# Patient Record
Sex: Male | Born: 1960
Health system: Southern US, Community
[De-identification: ages and names within clinical notes are randomized; demographics above are authoritative.]

## PROBLEM LIST (undated history)

## (undated) DIAGNOSIS — I10 Essential (primary) hypertension: Secondary | ICD-10-CM

## (undated) DIAGNOSIS — H3321 Serous retinal detachment, right eye: Secondary | ICD-10-CM

## (undated) DIAGNOSIS — L237 Allergic contact dermatitis due to plants, except food: Secondary | ICD-10-CM

## (undated) DIAGNOSIS — T7840XA Allergy, unspecified, initial encounter: Secondary | ICD-10-CM

## (undated) DIAGNOSIS — F419 Anxiety disorder, unspecified: Secondary | ICD-10-CM

## (undated) DIAGNOSIS — K635 Polyp of colon: Secondary | ICD-10-CM

## (undated) DIAGNOSIS — E7800 Pure hypercholesterolemia, unspecified: Secondary | ICD-10-CM

## (undated) HISTORY — DX: Allergy, unspecified, initial encounter: T78.40XA

## (undated) HISTORY — DX: Pure hypercholesterolemia, unspecified: E78.00

## (undated) HISTORY — DX: Serous retinal detachment, right eye: H33.21

## (undated) HISTORY — DX: Polyp of colon: K63.5

## (undated) HISTORY — PX: RETINAL DETACHMENT SURGERY: SHX105

## (undated) HISTORY — DX: Allergic contact dermatitis due to plants, except food: L23.7

## (undated) HISTORY — DX: Essential (primary) hypertension: I10

## (undated) HISTORY — PX: EYE SURGERY: SHX253

---

## 1990-03-22 DIAGNOSIS — H3321 Serous retinal detachment, right eye: Secondary | ICD-10-CM

## 1990-03-22 HISTORY — DX: Serous retinal detachment, right eye: H33.21

## 2007-04-23 HISTORY — PX: COLONOSCOPY: SHX174

## 2007-05-12 ENCOUNTER — Encounter (INDEPENDENT_AMBULATORY_CARE_PROVIDER_SITE_OTHER): Payer: Self-pay | Admitting: Gastroenterology

## 2007-05-12 ENCOUNTER — Ambulatory Visit (HOSPITAL_COMMUNITY): Admission: RE | Admit: 2007-05-12 | Discharge: 2007-05-12 | Payer: Self-pay | Admitting: Gastroenterology

## 2009-02-03 ENCOUNTER — Telehealth (INDEPENDENT_AMBULATORY_CARE_PROVIDER_SITE_OTHER): Payer: Self-pay | Admitting: *Deleted

## 2009-02-04 ENCOUNTER — Ambulatory Visit: Payer: Self-pay

## 2009-02-04 ENCOUNTER — Encounter (HOSPITAL_COMMUNITY): Admission: RE | Admit: 2009-02-04 | Discharge: 2009-03-19 | Payer: Self-pay | Admitting: Family Medicine

## 2009-02-04 ENCOUNTER — Ambulatory Visit: Payer: Self-pay | Admitting: Cardiology

## 2010-03-14 ENCOUNTER — Ambulatory Visit
Admission: RE | Admit: 2010-03-14 | Discharge: 2010-03-14 | Payer: Self-pay | Source: Home / Self Care | Admitting: Emergency Medicine

## 2010-03-14 DIAGNOSIS — J029 Acute pharyngitis, unspecified: Secondary | ICD-10-CM | POA: Insufficient documentation

## 2010-03-14 DIAGNOSIS — I10 Essential (primary) hypertension: Secondary | ICD-10-CM | POA: Insufficient documentation

## 2010-03-14 DIAGNOSIS — E782 Mixed hyperlipidemia: Secondary | ICD-10-CM | POA: Insufficient documentation

## 2010-03-14 DIAGNOSIS — E785 Hyperlipidemia, unspecified: Secondary | ICD-10-CM

## 2010-03-14 LAB — CONVERTED CEMR LAB: Rapid Strep: NEGATIVE

## 2010-04-23 NOTE — Assessment & Plan Note (Signed)
Summary: SORE THROAT/TM   Vital Signs:  Patient Profile:   50 Years Old Male CC:      Cold & URI symptoms x 3 days Height:     75 inches Weight:      192 pounds O2 Sat:      100 % O2 treatment:    Room Air Temp:     97.4 degrees F oral Pulse rate:   69 / minute Resp:     18 per minute BP sitting:   113 / 75  (left arm) Cuff size:   regular  Vitals Entered By: Lajean Saver RN (March 14, 2010 12:09 PM)                  Updated Prior Medication List: LISINOPRIL-HYDROCHLOROTHIAZIDE 20-25 MG TABS (LISINOPRIL-HYDROCHLOROTHIAZIDE) once daily * PREVASTATIN 40MG  once daily FISH OIL 1000 MG CAPS (OMEGA-3 FATTY ACIDS)  MULTIVITAMINS  TABS (MULTIPLE VITAMIN)   Current Allergies: No known allergies History of Present Illness History from: patient Chief Complaint: Cold & URI symptoms x 3 days History of Present Illness: 50 Years Old Male complains of onset of cold symptoms for 3 days.  Stephen [name] has been using OTC mucines which is helping a little bit. ++ sore throat (main symptom) No cough No pleuritic pain No wheezing + nasal congestion No post-nasal drainage + sinus pain/pressure No chest congestion No itchy/red eyes No earache No hemoptysis No SOB No chills/sweats No fever No nausea No vomiting No abdominal pain No diarrhea No skin rashes No fatigue No myalgias No headache   REVIEW OF SYSTEMS Constitutional Symptoms      Denies fever, chills, night sweats, weight loss, weight gain, and fatigue.  Eyes       Denies change in vision, eye pain, eye discharge, glasses, contact lenses, and eye surgery. Ear/Nose/Throat/Mouth       Complains of frequent runny nose, sinus problems, sore throat, and hoarseness.      Denies hearing loss/aids, change in hearing, ear pain, ear discharge, dizziness, frequent nose bleeds, and tooth pain or bleeding.  Respiratory       Denies dry cough, productive cough, wheezing, shortness of breath, asthma, bronchitis, and emphysema/COPD.   Cardiovascular       Denies murmurs, chest pain, and tires easily with exhertion.    Gastrointestinal       Denies stomach pain, nausea/vomiting, diarrhea, constipation, blood in bowel movements, and indigestion. Genitourniary       Denies painful urination, kidney stones, and loss of urinary control. Neurological       Complains of headaches.      Denies paralysis, seizures, and fainting/blackouts. Musculoskeletal       Denies muscle pain, joint pain, joint stiffness, decreased range of motion, redness, swelling, muscle weakness, and gout.  Skin       Denies bruising, unusual mles/lumps or sores, and hair/skin or nail changes.  Psych       Denies mood changes, temper/anger issues, anxiety/stress, speech problems, depression, and sleep problems.  Past History:  Past Medical History: Hyperlipidemia Hypertension  Past Surgical History: detached retina  Family History: parkinsons- mother Family History of Colon CA 1st degree relative <60- father  Social History: Never Smoked Alcohol use-yes Drug use-no Smoking Status:  never Drug Use:  no Physical Exam General appearance: well developed, well nourished, no acute distress Ears: normal, no lesions or deformities, cerumen right Nasal: clear discharge Oral/Pharynx: clear PND, no erythema Neck: neck supple,  trachea midline, no masses Chest/Lungs: no rales, wheezes,  or rhonchi bilateral, breath sounds equal without effort Heart: regular rate and  rhythm, no murmur MSE: oriented to time, place, and person Assessment New Problems: UPPER RESPIRATORY INFECTION, ACUTE (ICD-465.9) SORE THROAT (ICD-462) FAMILY HISTORY OF COLON CA 1ST DEGREE RELATIVE <60 (ICD-V16.0) HYPERTENSION (ICD-401.9) HYPERLIPIDEMIA (ICD-272.4)   Patient Education: Patient and/or caregiver instructed in the following: rest, fluids, Tylenol prn.  Plan New Medications/Changes: AMOXICILLIN 875 MG TABS (AMOXICILLIN) 1 by mouth two times a day for 7 days   #14 x 0, 03/14/2010, Hoyt Koch MD PREDNISONE 10 MG TABS (PREDNISONE) 20mg  two times a day for 4 days, no taper  #QS x 0, 03/14/2010, Hoyt Koch MD  New Orders: New Patient Level III [16109] Rapid Strep [60454] Planning Comments:   1)  Take the prescribed antibiotic as instructed.  Hold for a few days since this is likely viral and will be gone in a few days anyway. 2)  Use nasal saline solution (over the counter) at least 3 times a day. 3)  Use over the counter decongestants like Zyrtec-D every 12 hours as needed to help with congestion. 4)  Can take tylenol every 6 hours or motrin every 8 hours for pain or fever. 5)  Follow up with your primary doctor  if no improvement in 5-7 days, sooner if increasing pain, fever, or new symptoms.     The patient and/or caregiver has been counseled thoroughly with regard to medications prescribed including dosage, schedule, interactions, rationale for use, and possible side effects and they verbalize understanding.  Diagnoses and expected course of recovery discussed and will return if not improved as expected or if the condition worsens. Patient and/or caregiver verbalized understanding.  Prescriptions: AMOXICILLIN 875 MG TABS (AMOXICILLIN) 1 by mouth two times a day for 7 days  #14 x 0   Entered and Authorized by:   Hoyt Koch MD   Signed by:   Hoyt Koch MD on 03/14/2010   Method used:   Print then Give to Patient   RxID:   [id number] PREDNISONE 10 MG TABS (PREDNISONE) 20mg  two times a day for 4 days, no taper  #QS x 0   Entered and Authorized by:   Hoyt Koch MD   Signed by:   Hoyt Koch MD on 03/14/2010   Method used:   Print then Give to Patient   RxID:   [id number]   Orders Added: 1)  New Patient Level III [41324] 2)  Rapid Strep [40102]    Laboratory Results  Date/Time Received: March 14, 2010 12:24 PM  Date/Time Reported: March 14, 2010 12:24 PM   Other Tests  Rapid  Strep: negative  Kit Test Internal QC: Negative   (Normal Range: Negative)

## 2010-08-04 NOTE — Op Note (Signed)
NAMEGUIDO, COMP               ACCOUNT NO.:  [id number]   MEDICAL RECORD NO.:  [id number]          PATIENT TYPE:  AMB   LOCATION:  ENDO                         FACILITY:  [facility]   PHYSICIAN:  Petra Kuba, M.D.    DATE OF BIRTH:  12-13-1960   DATE OF PROCEDURE:  05/12/2007  DATE OF DISCHARGE:                               OPERATIVE REPORT   PROCEDURE:  Colonoscopy.   INDICATIONS:  Family history of colon cancer in the dad at age 50.   CONSENT:  Consent was signed after risks, benefits, methods, options  thoroughly discussed in the office.   MEDICINES USED:  Fentanyl 100 mcg, Versed 10 mg.   DESCRIPTION OF PROCEDURE:  Rectal inspection is pertinent for external  hemorrhoids, small.  Digital exam was negative.   The pediatric video colonoscope was inserted and easily advanced around  the colon to the cecum.  This did require abdominal pressure, but no  position changes.  No obvious abnormality was seen on insertion.  The  cecum was identified by the appendiceal orifice and the ileocecal valve.  In fact, the scope was inserted a short way into the terminal ileum,  which was normal.  Photo documentation was obtained.  The scope was  slowly withdrawn.   The prep was adequate.  There was some liquid stool that required  washing and suctioning on slow withdrawal through the colon.  The cecum,  ascending, transverse and descending were normal.  In the distal  sigmoid, a few hyperplastic-appearing polyps were seen and were cold  biopsied times one and put in one container.  In the very distal  sigmoid, a small polyp was seen, snared, electrocautery applied and the  polyp was suctioned through the scope and collected in the trap and put  in a second container.  The scope was withdrawn back to the rectum.  Anorectal pull-through and retroflexion confirmed some small  hemorrhoids.  The scope was straightened, readvanced a short ways up the  left side of the colon, air was suctioned,  scope removed.  The patient  tolerated the procedure well.  There was no obvious immediate  complication.   ENDOSCOPIC DIAGNOSES:  1. Internal and external small hemorrhoids.  2. Distal sigmoid small polyp, snared.  3. Few hyperplastic-appearing distal sigmoid polyps, cold biopsied,      put in a separate container.  4. Otherwise within normal limits to the terminal ileum.   PLAN:  Await pathology, but probably recheck colon screening in five  years.  Happy to see back p.r.n.  Otherwise  return care to Dr. Cliffton Asters.           ______________________________  Petra Kuba, M.D.     MEM/MEDQ  D:  05/12/2007  T:  05/12/2007  Job:  [id number]   cc:   [place]. Cliffton Asters, M.D.

## 2010-09-14 ENCOUNTER — Emergency Department (HOSPITAL_BASED_OUTPATIENT_CLINIC_OR_DEPARTMENT_OTHER)
Admission: EM | Admit: 2010-09-14 | Discharge: 2010-09-14 | Disposition: A | Discharge status: Home / Self Care | Payer: 59 | Attending: Emergency Medicine | Admitting: Emergency Medicine

## 2010-09-14 ENCOUNTER — Emergency Department (INDEPENDENT_AMBULATORY_CARE_PROVIDER_SITE_OTHER): Payer: 59

## 2010-09-14 DIAGNOSIS — I10 Essential (primary) hypertension: Secondary | ICD-10-CM | POA: Insufficient documentation

## 2010-09-14 DIAGNOSIS — R55 Syncope and collapse: Secondary | ICD-10-CM | POA: Insufficient documentation

## 2010-09-14 DIAGNOSIS — J329 Chronic sinusitis, unspecified: Secondary | ICD-10-CM

## 2010-09-14 DIAGNOSIS — E7800 Pure hypercholesterolemia, unspecified: Secondary | ICD-10-CM | POA: Insufficient documentation

## 2010-09-14 DIAGNOSIS — S060X1A Concussion with loss of consciousness of 30 minutes or less, initial encounter: Secondary | ICD-10-CM | POA: Insufficient documentation

## 2010-09-14 DIAGNOSIS — M5030 Other cervical disc degeneration, unspecified cervical region: Secondary | ICD-10-CM

## 2010-09-14 DIAGNOSIS — W19XXXA Unspecified fall, initial encounter: Secondary | ICD-10-CM

## 2010-09-14 DIAGNOSIS — M47812 Spondylosis without myelopathy or radiculopathy, cervical region: Secondary | ICD-10-CM

## 2010-09-14 DIAGNOSIS — Y92009 Unspecified place in unspecified non-institutional (private) residence as the place of occurrence of the external cause: Secondary | ICD-10-CM | POA: Insufficient documentation

## 2010-09-14 DIAGNOSIS — S02109A Fracture of base of skull, unspecified side, initial encounter for closed fracture: Secondary | ICD-10-CM

## 2010-09-14 LAB — DIFFERENTIAL
Lymphocytes Relative: 14 % (ref 12–46)
Lymphs Abs: 1.2 10*3/uL (ref 0.7–4.0)
Monocytes Relative: 7 % (ref 3–12)
Neutro Abs: 7.2 10*3/uL (ref 1.7–7.7)
Neutrophils Relative %: 79 % — ABNORMAL HIGH (ref 43–77)

## 2010-09-14 LAB — CBC
HCT: 41.9 % (ref 39.0–52.0)
Hemoglobin: 14.9 g/dL (ref 13.0–17.0)
MCH: 30.5 pg (ref 26.0–34.0)
MCV: 85.9 fL (ref 78.0–100.0)
Platelets: 177 10*3/uL (ref 150–400)
RBC: 4.88 MIL/uL (ref 4.22–5.81)
WBC: 9.2 10*3/uL (ref 4.0–10.5)

## 2010-09-14 LAB — BASIC METABOLIC PANEL
CO2: 25 mEq/L (ref 19–32)
Calcium: 10.1 mg/dL (ref 8.4–10.5)
Chloride: 93 mEq/L — ABNORMAL LOW (ref 96–112)
Creatinine, Ser: 1.2 mg/dL (ref 0.50–1.35)
Glucose, Bld: 87 mg/dL (ref 70–99)

## 2010-09-14 LAB — TROPONIN I: Troponin I: <0.3 ng/mL (ref <0.30)

## 2010-09-14 LAB — CK TOTAL AND CKMB (NOT AT ARMC): Total CK: 146 U/L (ref 7–232)

## 2010-09-14 LAB — GLUCOSE, CAPILLARY: Glucose-Capillary: 89 mg/dL (ref 70–99)

## 2010-09-15 ENCOUNTER — Encounter: Payer: Self-pay | Admitting: Internal Medicine

## 2010-09-15 ENCOUNTER — Ambulatory Visit (INDEPENDENT_AMBULATORY_CARE_PROVIDER_SITE_OTHER): Payer: 59 | Admitting: Internal Medicine

## 2010-09-15 VITALS — BP 120/72 | HR 78 | Temp 97.3°F | Wt 187.0 lb

## 2010-09-15 DIAGNOSIS — R55 Syncope and collapse: Secondary | ICD-10-CM

## 2010-09-15 NOTE — Progress Notes (Signed)
Subjective:    Patient ID: Stephen [name], male    DOB: 04-08-1960, 50 y.o.   MRN: [id number]  HPI Mr. Kendzierski presents to establish for on-going care. On the 25th he had a vaso-vagal espisode after working out in the heat at which time he struck the left forehead as he fell and had a very brief loss of consciousness. He was seen in the ED, records reviewed, images reviewed, where he was found to have a partial cortical fracture of the left frontal bone with normal brain scan and normal CT c-spine. He had routine labs, CBC, Cmet, CE that were all normal. He was diagnosed with possible concussion. Since yesterday he has been feeling better. No major headache, no change in vision, no cognitive problems or speech problems, no coordination problems.   Past Medical History  Diagnosis Date  . High cholesterol   . Colon polyps   . Hypertension   . Detached retina, right 1992    Dr. Jenkins Rouge at [facility] did orbital band procedure with good results.    No past surgical history on file. Family History  Problem Relation Age of Onset  . Parkinsonism Mother   . Cancer Father     colon cancer with mets  . Hyperlipidemia Father   . Hypertension Father   . Hypertension Brother   . Diabetes Neg Hx   . Heart disease Neg Hx    History   Social History  . Marital Status: Married    Spouse Name: N/A    Number of Children: 2  . Years of Education: 16   Occupational History  . busines [employer]   Social History Main Topics  . Smoking status: Never Smoker   . Smokeless tobacco: Never Used  . Alcohol Use: 0.5 oz/week    1 drink(s) per week  . Drug Use: No  . Sexually Active: Yes -- Male partner(s)   Other Topics Concern  . Not on file   Social History Narrative   HSG, [employer] - BS business admin. Married '84 - 1 son '86, 1 dtr -'77. Work - Engineer, drilling, part-time.Marriage is in good health.        Review of Systems Review of Systems  Constitutional:  Negative for fever, chills, activity  change and unexpected weight change.  HEENT:  Negative for hearing loss, ear pain, congestion, neck stiffness and postnasal drip. Negative for sore throat or swallowing problems. Negative for dental complaints.   Eyes: Negative for vision loss or change in visual acuity.  Respiratory: Negative for chest tightness and wheezing.   Cardiovascular: Negative for chest pain and palpitation. No decreased exercise tolerance Gastrointestinal: No change in bowel habit. No bloating or gas. No reflux or indigestion Genitourinary: Negative for urgency, frequency, flank pain and difficulty urinating.  Musculoskeletal: Negative for myalgias, back pain, arthralgias and gait problem.  Neurological: Negative for dizziness, tremors, weakness and headaches.  Hematological: Negative for adenopathy.  Psychiatric/Behavioral: Negative for behavioral problems and dysphoric mood.       Objective:   Physical Exam Vitals noted. Gen'l -a tall, slender, healthy appearing white male in no distress HEENT - Small bump above the left eyebrow. C&S clear. No Battle's sign, no Racoon's eye. Nekc supple Chest - no deformity Lungs - CTAP Cor-RRR Neuro - A&O x 3, speech clear, cognition is normal, able to say "no ifs ands or buts." CN II-XII - normal facial symmetry and motion, PERRLA, EOMI, fundi with sharp disk margins, no tongue fasiculations or deviation. Normal shoulder  shrug. MS normal throughout. Cerebellar function - nl gait and station, no tremor       Assessment & Plan:  Vaso-vagal episode - by history, data review and exam patient seems to have experience a vaso-vagal episode. Reviewed CT images - he has sustained a small fracture of the cortical frontal bone. Brain appears normal. Exam is normal. He may have a mild concussion but has no symptoms or findings.  Plan - no restrictions on activity           Advised as to how to avoid future problems (hydration!!) and that should he ever feel feint to go to ground  voluntarily.  He will return as needed.

## 2010-10-07 ENCOUNTER — Telehealth: Payer: Self-pay | Admitting: *Deleted

## 2010-10-07 NOTE — Telephone Encounter (Signed)
Inquiring as to if previous MD medical records have been received?

## 2010-10-07 NOTE — Telephone Encounter (Signed)
One note from Hoyt Koch, MD from '11

## 2010-10-08 NOTE — Telephone Encounter (Signed)
Pt informed. I advised pt to contact the other MD's office to obtain his records if he wants Dr. Debby Bud to have them. He states Ami Bullins, CMA for Dr. Debby Bud was going to try to obtain his records for him. I advised him Ami is out of office for the rest of this week but I will try to look for his records and call him if I find them.

## 2010-11-06 ENCOUNTER — Telehealth: Payer: Self-pay | Admitting: *Deleted

## 2010-11-06 NOTE — Telephone Encounter (Signed)
Pt call req to know if MD received copy of records from [place] MD, Dr Laurann Montana [phone]). Per MD - these were not received as of yet. I left pt VM that we would attempt again to get these. (Already have made attempt previously but never received records) Need to call [facility] & inquire on Monday.

## 2010-11-16 NOTE — Telephone Encounter (Signed)
Spoke w/ Darryl Lent. Need to fax release form to [phone]. Spoke w/Pt's wife. She will get new release form signed and back to the office soon.

## 2010-11-17 NOTE — Telephone Encounter (Signed)
Record release faxed to [phone] form sent to scanning

## 2010-11-24 ENCOUNTER — Telehealth: Payer: Self-pay | Admitting: Internal Medicine

## 2010-11-24 NOTE — Telephone Encounter (Signed)
Forwarded to Dr. Norins for review. °

## 2011-03-04 ENCOUNTER — Other Ambulatory Visit: Payer: Self-pay | Admitting: *Deleted

## 2011-03-04 MED ORDER — PRAVASTATIN SODIUM 40 MG PO TABS: 40.0000 mg | ORAL_TABLET | Freq: Every day | ORAL | Status: DC

## 2011-04-22 ENCOUNTER — Telehealth: Payer: Self-pay | Admitting: *Deleted

## 2011-04-22 ENCOUNTER — Encounter: Payer: Self-pay | Admitting: Internal Medicine

## 2011-04-22 ENCOUNTER — Other Ambulatory Visit (INDEPENDENT_AMBULATORY_CARE_PROVIDER_SITE_OTHER): Payer: 59

## 2011-04-22 ENCOUNTER — Encounter: Payer: Self-pay | Admitting: *Deleted

## 2011-04-22 ENCOUNTER — Ambulatory Visit (INDEPENDENT_AMBULATORY_CARE_PROVIDER_SITE_OTHER): Payer: 59 | Admitting: Internal Medicine

## 2011-04-22 DIAGNOSIS — I10 Essential (primary) hypertension: Secondary | ICD-10-CM

## 2011-04-22 DIAGNOSIS — E785 Hyperlipidemia, unspecified: Secondary | ICD-10-CM

## 2011-04-22 DIAGNOSIS — Z0000 Encounter for general adult medical examination without abnormal findings: Secondary | ICD-10-CM

## 2011-04-22 LAB — HEPATIC FUNCTION PANEL
Alkaline Phosphatase: 55 U/L (ref 39–117)
Bilirubin, Direct: 0.1 mg/dL (ref 0.0–0.3)
Total Bilirubin: 0.9 mg/dL (ref 0.3–1.2)

## 2011-04-22 LAB — COMPREHENSIVE METABOLIC PANEL
AST: 29 U/L (ref 0–37)
Albumin: 4.9 g/dL (ref 3.5–5.2)
Alkaline Phosphatase: 55 U/L (ref 39–117)
BUN: 22 mg/dL (ref 6–23)
Calcium: 9.5 mg/dL (ref 8.4–10.5)
Chloride: 100 mEq/L (ref 96–112)
Creatinine, Ser: 1 mg/dL (ref 0.4–1.5)
Glucose, Bld: 101 mg/dL — ABNORMAL HIGH (ref 70–99)
Potassium: 3.8 mEq/L (ref 3.5–5.1)

## 2011-04-22 LAB — LIPID PANEL
Cholesterol: 160 mg/dL (ref 0–200)
LDL Cholesterol: 90 mg/dL (ref 0–99)
Triglycerides: 39 mg/dL (ref 0.0–149.0)
VLDL: 7.8 mg/dL (ref 0.0–40.0)

## 2011-04-22 MED ORDER — LISINOPRIL 20 MG PO TABS: 20.0000 mg | ORAL_TABLET | Freq: Every day | ORAL | Status: DC

## 2011-04-22 NOTE — Telephone Encounter (Signed)
Same day abstraction. 

## 2011-04-22 NOTE — Progress Notes (Signed)
Subjective:    Patient ID: Stephen [name], male    DOB: [birth year], 51 y.o.   MRN: [id number]  HPI Stephen [name] presents for an annual medical exam. He has had no major illness, surgery, or injury.   Past Medical History  Diagnosis Date  . High cholesterol   . Colon polyps   . Hypertension   . Detached retina, right 1992    Dr. Jenkins [name] at [facility] did orbital band procedure with good results.    Past Surgical History  Procedure Date  . Retinal detachment surgery    Family History  Problem Relation Age of Onset  . Parkinsonism Mother   . Cancer Father     colon cancer with mets  . Hyperlipidemia Father   . Hypertension Father   . Hypertension Brother   . Diabetes Neg Hx   . Heart disease Neg Hx    History   Social History  . Marital Status: Married    Spouse Name: N/A    Number of Children: 2  . Years of Education: 16   Occupational History  . busines [employer]   Social History Main Topics  . Smoking status: Never Smoker   . Smokeless tobacco: Never Used  . Alcohol Use: 0.5 oz/week    1 drink(s) per week  . Drug Use: No  . Sexually Active: Yes -- Male partner(s)   Other Topics Concern  . Not on file   Social History Narrative   HSG, [employer] - BS business admin. Married '84 - 1 son '86, 1 dtr -'14. Work - Engineer, drilling, part-time.Marriage is in good health.        Review of Systems Constitutional:  Negative for fever, chills, activity change and unexpected weight change.  HEENT:  Negative for hearing loss, ear pain, congestion, neck stiffness and postnasal drip. Negative for sore throat or swallowing problems. Negative for dental complaints.   Eyes: Negative for vision loss or change in visual acuity.  Respiratory: Negative for chest tightness and wheezing. Negative for DOE.   Cardiovascular: Negative for chest pain or palpitations. No decreased exercise tolerance Gastrointestinal: No change in bowel habit. No bloating or gas. No reflux or  indigestion Genitourinary: Negative for urgency, frequency, flank pain and difficulty urinating.  Musculoskeletal: Negative for myalgias, back pain, arthralgias and gait problem.  Neurological: Negative for dizziness, tremors, weakness and headaches.  Hematological: Negative for adenopathy.  Psychiatric/Behavioral: Negative for behavioral problems and dysphoric mood.        Objective:   Physical Exam Filed Vitals:   04/22/11 0911  BP: 118/78  Pulse: 88  Temp: 97.6 F (36.4 C)  Resp: 16   Gen'l: Well nourished well developed white male in no acute distress  HEENT: Head: Normocephalic and atraumatic. Right Ear: External ear normal. EAC/TM nl. Left Ear: External ear normal.  EAC/TM nl. Nose: Nose normal. Mouth/Throat: Oropharynx is clear and moist. Dentition - native, in good repair. No buccal or palatal lesions. Posterior pharynx clear. Eyes: Conjunctivae and sclera clear. EOM intact. Pupils are equal, round, and reactive to light. Right eye exhibits no discharge. Left eye exhibits no discharge. Neck: Normal range of motion. Neck supple. No JVD present. No tracheal deviation present. No thyromegaly present.  Cardiovascular: Normal rate, regular rhythm, no gallop, no friction rub, no murmur heard.      Quiet precordium. 2+ radial and DP pulses . No carotid bruits Pulmonary/Chest: Effort normal. No respiratory distress or increased WOB, no wheezes, no rales. No chest wall deformity or  CVAT. Abdominal: Soft. Bowel sounds are normal in all quadrants. He exhibits no distension, no tenderness, no rebound or guarding, No heptosplenomegaly  Genitourinary:  rectal exam with NST, prostate smooth, normal size w/o nodules Musculoskeletal: Normal range of motion. He exhibits no edema and no tenderness.       Small and large joints without redness, synovial thickening or deformity. Full range of motion preserved about all small, median and large joints.  Lymphadenopathy:    He has no cervical or  supraclavicular adenopathy.  Neurological: He is alert and oriented to person, place, and time. CN II-XII intact. DTRs 2+ and symmetrical biceps, radial and patellar tendons. Cerebellar function normal with no tremor, rigidity, normal gait and station.  Skin: Skin is warm and dry. No rash noted. No erythema. Multiple freckles and moles. Sebhorrheic keratosis right back 8 cm below scapula Psychiatric: He has a normal mood and affect. His behavior is normal. Thought content normal.   Lab Results  Component Value Date   WBC 9.2 09/14/2010   HGB 14.9 09/14/2010   HCT 41.9 09/14/2010   PLT 177 09/14/2010   GLUCOSE 101* 04/22/2011   CHOL 160 04/22/2011   TRIG 39.0 04/22/2011   HDL 61.80 04/22/2011   LDLCALC 90 04/22/2011        ALT 43 04/22/2011        AST 29 04/22/2011   NA 139 04/22/2011   K 3.8 04/22/2011   CL 100 04/22/2011   CREATININE 1.0 04/22/2011   BUN 22 04/22/2011   CO2 32 04/22/2011          Assessment & Plan:

## 2011-04-25 DIAGNOSIS — Z0000 Encounter for general adult medical examination without abnormal findings: Secondary | ICD-10-CM | POA: Insufficient documentation

## 2011-04-25 NOTE — Assessment & Plan Note (Signed)
BP Readings from Last 3 Encounters:  04/22/11 118/78  09/15/10 120/72  03/14/10 113/75   Excellent control. No change in regimen

## 2011-04-25 NOTE — Assessment & Plan Note (Signed)
Interval medical history is unremarkable. Physical exam is normal. Lab results are in normal range - excellent. He is current with colorectal cancer screening. Immunizations - current.  In summary - a healthy appearing man. His chronic medical conditions are well controlled. He is encouraged to incorporate aerobic exercise into his routine: 3 times a week for 30 min. He will return for follow-up in 1 year or sooner as needed.

## 2011-04-25 NOTE — Assessment & Plan Note (Signed)
Lab reveals excellent results: HDL at goal of 39+; LDL at goal of 100 or less. Liver functions are normal.  Plan- continue present regimen

## 2011-07-09 ENCOUNTER — Encounter: Payer: Self-pay | Admitting: Endocrinology

## 2011-07-09 ENCOUNTER — Ambulatory Visit (INDEPENDENT_AMBULATORY_CARE_PROVIDER_SITE_OTHER): Payer: 59 | Admitting: Endocrinology

## 2011-07-09 VITALS — BP 130/86 | HR 62 | Temp 98.1°F | Ht 74.0 in | Wt 191.0 lb

## 2011-07-09 DIAGNOSIS — M79602 Pain in left arm: Secondary | ICD-10-CM

## 2011-07-09 DIAGNOSIS — M79609 Pain in unspecified limb: Secondary | ICD-10-CM

## 2011-07-09 NOTE — Patient Instructions (Signed)
Here are some samples of "vimovo" 500/20.  Take 1 pill, 2x a day. I hope you feel better soon.  If you don't feel better by next week, please call back.   We would be happy to refer you to an orthopedic specialist.

## 2011-07-09 NOTE — Progress Notes (Signed)
  Subjective:    Patient ID: Stephen [name], male    DOB: 14-Nov-1960, 51 y.o.   MRN: [id number]  HPI Pt states 1 month of moderate pain at the left forearm (just distal to the elbow, radial aspect), but no assoc numbness.  No injury, but he says he had blood drawn there. Past Medical History  Diagnosis Date  . High cholesterol   . Colon polyps   . Hypertension   . Detached retina, right 1992    Dr. Jenkins Rouge at [facility] did orbital band procedure with good results.     Past Surgical History  Procedure Date  . Retinal detachment surgery     History   Social History  . Marital Status: Married    Spouse Name: N/A    Number of Children: 2  . Years of Education: 16   Occupational History  . busines [employer]   Social History Main Topics  . Smoking status: Never Smoker   . Smokeless tobacco: Never Used  . Alcohol Use: 0.5 oz/week    1 drink(s) per week  . Drug Use: No  . Sexually Active: Yes -- Male partner(s)   Other Topics Concern  . Not on file   Social History Narrative   HSG, [employer] - BS business admin. Married '84 - 1 son '86, 1 dtr -'27. Work - Engineer, drilling, part-time.Marriage is in good health.     Current Outpatient Prescriptions on File Prior to Visit  Medication Sig Dispense Refill  . fish oil-omega-3 fatty acids 1000 MG capsule Take 2 g by mouth daily.        Marland Kitchen lisinopril (PRINIVIL,ZESTRIL) 20 MG tablet Take 1 tablet (20 mg total) by mouth daily.  30 tablet  3  . Multiple Vitamin (MULTIVITAMIN) tablet Take 1 tablet by mouth daily.        . pravastatin (PRAVACHOL) 40 MG tablet Take 1 tablet (40 mg total) by mouth daily.  90 tablet  1    No Known Allergies  Family History  Problem Relation Age of Onset  . Parkinsonism Mother   . Cancer Father     colon cancer with mets  . Hyperlipidemia Father   . Hypertension Father   . Hypertension Brother   . Diabetes Neg Hx   . Heart disease Neg Hx     BP 130/86  Pulse 62  Temp(Src) 98.1 F (36.7 C) (Oral)  Ht 6'  2" (1.88 m)  Wt 191 lb (86.637 kg)  BMI 24.52 kg/m2  SpO2 97%  Review of Systems Denies rash.    Objective:   Physical Exam VITAL SIGNS:  See vs page GENERAL: no distress LUE: slight tenderness at the area described above.  Motor is 5/5, except as limited by pain Neuro: sensation is intact to touch on the LUE. Left radial pulse is normal    Assessment & Plan:  Arm pain, new

## 2011-08-24 ENCOUNTER — Other Ambulatory Visit: Payer: Self-pay | Admitting: Internal Medicine

## 2011-09-02 ENCOUNTER — Other Ambulatory Visit: Payer: Self-pay | Admitting: Internal Medicine

## 2011-12-01 ENCOUNTER — Other Ambulatory Visit: Payer: Self-pay | Admitting: Internal Medicine

## 2012-04-14 ENCOUNTER — Encounter: Payer: Self-pay | Admitting: Internal Medicine

## 2012-04-14 ENCOUNTER — Ambulatory Visit (INDEPENDENT_AMBULATORY_CARE_PROVIDER_SITE_OTHER): Payer: 59 | Admitting: Internal Medicine

## 2012-04-14 VITALS — BP 118/60 | HR 74 | Temp 97.0°F | Resp 10 | Wt 190.1 lb

## 2012-04-14 DIAGNOSIS — J329 Chronic sinusitis, unspecified: Secondary | ICD-10-CM

## 2012-04-14 DIAGNOSIS — A499 Bacterial infection, unspecified: Secondary | ICD-10-CM

## 2012-04-14 DIAGNOSIS — B9689 Other specified bacterial agents as the cause of diseases classified elsewhere: Secondary | ICD-10-CM | POA: Insufficient documentation

## 2012-04-14 MED ORDER — CEFUROXIME AXETIL 500 MG PO TABS: 500.0000 mg | ORAL_TABLET | Freq: Two times a day (BID) | ORAL | Status: DC

## 2012-04-14 NOTE — Progress Notes (Signed)
  Subjective:    Patient ID: Stephen [name], male    DOB: Nov 21, 1960, 52 y.o.   MRN: [id number]  Sinusitis This is a new problem. The current episode started 1 to 4 weeks ago. The problem has been gradually worsening since onset. There has been no fever. His pain is at a severity of 0/10. The pain is mild. Associated symptoms include congestion, sinus pressure and a sore throat. Pertinent negatives include no chills, coughing, diaphoresis, ear pain, headaches, hoarse voice, neck pain, shortness of breath, sneezing or swollen glands. Past treatments include oral decongestants, spray decongestants and saline sprays. The treatment provided moderate relief.      Review of Systems  Constitutional: Negative for fever, chills, diaphoresis, activity change, appetite change, fatigue and unexpected weight change.  HENT: Positive for congestion, sore throat and sinus pressure. Negative for ear pain, nosebleeds, hoarse voice, facial swelling, rhinorrhea, sneezing, drooling, mouth sores, trouble swallowing, neck pain, dental problem, voice change and postnasal drip.   Eyes: Negative.   Respiratory: Negative for cough, chest tightness and shortness of breath.   Cardiovascular: Negative.   Gastrointestinal: Negative for nausea, vomiting, abdominal pain, diarrhea and constipation.  Genitourinary: Negative.   Musculoskeletal: Negative.   Skin: Negative for color change, pallor, rash and wound.  Neurological: Negative for dizziness, weakness and headaches.  Hematological: Negative for adenopathy. Does not bruise/bleed easily.  Psychiatric/Behavioral: Negative.        Objective:   Physical Exam  Vitals reviewed. Constitutional: He is oriented to person, place, and time. He appears well-developed and well-nourished.  Non-toxic appearance. He does not have a sickly appearance. He does not appear ill. No distress.  HENT:  Head: Normocephalic and atraumatic. No trismus in the jaw.  Right Ear: Hearing, tympanic  membrane, external ear and ear canal normal.  Left Ear: Hearing, tympanic membrane, external ear and ear canal normal.  Nose: Mucosal edema and rhinorrhea present. No nose lacerations, sinus tenderness, nasal deformity, septal deviation or nasal septal hematoma. No epistaxis.  No foreign bodies. Right sinus exhibits maxillary sinus tenderness. Right sinus exhibits no frontal sinus tenderness. Left sinus exhibits maxillary sinus tenderness. Left sinus exhibits no frontal sinus tenderness.  Mouth/Throat: Mucous membranes are normal. Mucous membranes are not pale, not dry and not cyanotic. No oral lesions. No uvula swelling. Posterior oropharyngeal erythema present. No oropharyngeal exudate, posterior oropharyngeal edema or tonsillar abscesses.  Eyes: Conjunctivae normal are normal. Right eye exhibits no discharge. Left eye exhibits no discharge. No scleral icterus.  Neck: Normal range of motion. Neck supple. No JVD present. No tracheal deviation present.  Cardiovascular: Normal rate, regular rhythm, normal heart sounds and intact distal pulses.  Exam reveals no gallop and no friction rub.   No murmur heard. Pulmonary/Chest: Effort normal and breath sounds normal. No stridor. No respiratory distress. He has no wheezes. He has no rales. He exhibits no tenderness.  Abdominal: Soft. Bowel sounds are normal. He exhibits no distension and no mass. There is no tenderness. There is no rebound and no guarding.  Musculoskeletal: Normal range of motion. He exhibits no edema and no tenderness.  Lymphadenopathy:    He has no cervical adenopathy.  Neurological: He is oriented to person, place, and time.  Skin: Skin is warm and dry. No rash noted. He is not diaphoretic. No erythema. No pallor.  Psychiatric: He has a normal mood and affect. His behavior is normal. Judgment and thought content normal.          Assessment & Plan:

## 2012-04-14 NOTE — Patient Instructions (Signed)

## 2012-04-14 NOTE — Assessment & Plan Note (Signed)
Start ceftin for the infection 

## 2012-04-17 ENCOUNTER — Other Ambulatory Visit: Payer: Self-pay | Admitting: Internal Medicine

## 2012-05-30 ENCOUNTER — Other Ambulatory Visit (INDEPENDENT_AMBULATORY_CARE_PROVIDER_SITE_OTHER): Payer: 59

## 2012-05-30 ENCOUNTER — Encounter: Payer: Self-pay | Admitting: Internal Medicine

## 2012-05-30 ENCOUNTER — Ambulatory Visit (INDEPENDENT_AMBULATORY_CARE_PROVIDER_SITE_OTHER): Payer: 59 | Admitting: Internal Medicine

## 2012-05-30 VITALS — BP 130/90 | HR 64 | Temp 97.9°F | Ht 75.0 in | Wt 183.0 lb

## 2012-05-30 DIAGNOSIS — Z0000 Encounter for general adult medical examination without abnormal findings: Secondary | ICD-10-CM

## 2012-05-30 DIAGNOSIS — E785 Hyperlipidemia, unspecified: Secondary | ICD-10-CM

## 2012-05-30 DIAGNOSIS — I10 Essential (primary) hypertension: Secondary | ICD-10-CM

## 2012-05-30 LAB — COMPREHENSIVE METABOLIC PANEL
ALT: 32 U/L (ref 0–53)
AST: 25 U/L (ref 0–37)
Alkaline Phosphatase: 53 U/L (ref 39–117)
Creatinine, Ser: 1 mg/dL (ref 0.4–1.5)
GFR: 82.53 mL/min (ref >60.00)
Sodium: 140 mEq/L (ref 135–145)
Total Bilirubin: 1 mg/dL (ref 0.3–1.2)
Total Protein: 7.1 g/dL (ref 6.0–8.3)

## 2012-05-30 LAB — HEPATIC FUNCTION PANEL
ALT: 32 U/L (ref 0–53)
AST: 25 U/L (ref 0–37)
Albumin: 4.4 g/dL (ref 3.5–5.2)
Alkaline Phosphatase: 53 U/L (ref 39–117)
Total Protein: 7.1 g/dL (ref 6.0–8.3)

## 2012-05-30 LAB — LIPID PANEL
HDL: 52.3 mg/dL (ref >39.00)
LDL Cholesterol: 80 mg/dL (ref 0–99)
Total CHOL/HDL Ratio: 3
Triglycerides: 73 mg/dL (ref 0.0–149.0)
VLDL: 14.6 mg/dL (ref 0.0–40.0)

## 2012-05-30 NOTE — Progress Notes (Signed)
Subjective:    Patient ID: Stephen [name], male    DOB: 1960-10-13, 52 y.o.   MRN: [id number]  HPI Stephen [name] presents for a routine wellness exam and that you have been doing fine with no major illness, except for a sinus infection in January, no surgery and no injury. No adverse reactions to medications. Life is good.   He is current with his dentist, with his eye doctor for annual exam. He does exercise, has a reasonably healthy diet.  Past Medical History  Diagnosis Date  . High cholesterol   . Colon polyps   . Hypertension   . Detached retina, right 1992    Dr. Jenkins Rouge at [facility] did orbital band procedure with good results.    Past Surgical History  Procedure Laterality Date  . Retinal detachment surgery     Family History  Problem Relation Age of Onset  . Parkinsonism Mother   . Cancer Father     colon cancer with mets  . Hyperlipidemia Father   . Hypertension Father   . Hypertension Brother   . Diabetes Neg Hx   . Heart disease Neg Hx    History   Social History  . Marital Status: Married    Spouse Name: N/A    Number of Children: 2  . Years of Education: 16   Occupational History  . [place]   Social History Main Topics  . Smoking status: Never Smoker   . Smokeless tobacco: Never Used  . Alcohol Use: 0.5 oz/week    1 drink(s) per week  . Drug Use: No  . Sexually Active: Yes -- Male partner(s)   Other Topics Concern  . Not on file   Social History Narrative   HSG, [employer] - BS business admin. Married '84 - 1 son '86, 1 dtr -'75. Work - Engineer, drilling, part-time.   Marriage is in good health.       Review of Systems Constitutional:  Negative for fever, chills, activity change and unexpected weight change.  HEENT:  Negative for hearing loss, ear pain, congestion, neck stiffness and postnasal drip. Negative for sore throat or swallowing problems. Negative for dental complaints.   Eyes: Negative for vision loss or change in visual acuity.   Respiratory: Negative for chest tightness and wheezing. Negative for DOE.   Cardiovascular: Negative for chest pain or palpitations. No decreased exercise tolerance Gastrointestinal: No change in bowel habit. No bloating or gas. No reflux or indigestion Genitourinary: Negative for urgency, frequency, flank pain and difficulty urinating.  Musculoskeletal: Negative for myalgias, back pain, arthralgias and gait problem.  Neurological: Negative for dizziness, tremors, weakness and headaches.  Hematological: Negative for adenopathy.  Psychiatric/Behavioral: Negative for behavioral problems and dysphoric mood.       Objective:   Physical Exam Filed Vitals:   05/30/12 0903  BP: 130/90  Pulse: 64  Temp: 97.9 F (36.6 C)   Wt Readings from Last 3 Encounters:  05/30/12 183 lb (83.008 kg)  04/14/12 190 lb 1.9 oz (86.238 kg)  07/09/11 191 lb (86.637 kg)   Gen'l: Well nourished well developed white male in no acute distress  HEENT: Head: Normocephalic and atraumatic. Right Ear: External ear normal. EAC/TM nl. Left Ear: External ear normal.  EAC/TM nl. Nose: Nose normal. Mouth/Throat: Oropharynx is clear and moist. Dentition - native, in good repair. No buccal or palatal lesions. Posterior pharynx clear. Eyes: Conjunctivae and sclera clear. EOM intact. Pupils are equal, round, and reactive to light. Right eye exhibits  no discharge. Left eye exhibits no discharge. Neck: Normal range of motion. Neck supple. No JVD present. No tracheal deviation present. No thyromegaly present.  Cardiovascular: Normal rate, regular rhythm, no gallop, no friction rub, no murmur heard.      Quiet precordium. 2+ radial and 1+ DP pulses . No carotid bruits Pulmonary/Chest: Effort normal. No respiratory distress or increased WOB, no wheezes, no rales. No chest wall deformity or CVAT. Abdomen: Soft. Bowel sounds are normal in all quadrants. He exhibits no distension, no tenderness, no rebound or guarding, No  heptosplenomegaly  Genitourinary:  deferred Musculoskeletal: Normal range of motion. He exhibits no edema and no tenderness.       Small and large joints without redness, synovial thickening or deformity. Full range of motion preserved about all small, median and large joints.  Lymphadenopathy:    He has no cervical or supraclavicular adenopathy.  Neurological: He is alert and oriented to person, place, and time. CN II-XII intact. DTRs 2+ and symmetrical biceps, radial and patellar tendons. Cerebellar function normal with no tremor, rigidity, normal gait and station.  Skin: Skin is warm and dry. No rash noted. No erythema.  Psychiatric: He has a normal mood and affect. His behavior is normal. Thought content normal.   Lab Results  Component Value Date   WBC 9.2 09/14/2010   HGB 14.9 09/14/2010   HCT 41.9 09/14/2010   PLT 177 09/14/2010   GLUCOSE 94 05/30/2012   CHOL 147 05/30/2012   TRIG 73.0 05/30/2012   HDL 52.30 05/30/2012   LDLCALC 80 05/30/2012        ALT 32 05/30/2012   AST 25 05/30/2012        NA 140 05/30/2012   K 4.3 05/30/2012   CL 102 05/30/2012   CREATININE 1.0 05/30/2012   BUN 17 05/30/2012   CO2 30 05/30/2012        Assessment & Plan:

## 2012-05-30 NOTE — Patient Instructions (Addendum)
Thanks for coming in and also for helping teach a budding doctor. Do not reveal our secrets!!!!  Your exam is norma and you seem to be doing well.   Routine labs today and results will be in the letter I send you as well as on MyChart.  See you in a year or sooner as needed.

## 2012-05-31 NOTE — Assessment & Plan Note (Signed)
Interval history is negative for any major illness, injury or surgery. Physical exam is normal. Lab results are in normal range. He is current for colorectal cancer screening and is due for follow up this year. Discussed pros and cons of prostate cancer screening (USPHCTF recommendations reviewed and ACU April '13 recommendations) and he defers evaluation at this time. Immunizations - up to date.  In summary - a very nice man who appears to be healthy. He will continue his good health habits and return for routine follow up in 1 year.

## 2012-05-31 NOTE — Assessment & Plan Note (Signed)
LDL is very good - much better than goal of 130 or less. Liver functions are normal  Plan Continue present regimen

## 2012-05-31 NOTE — Assessment & Plan Note (Signed)
BP Readings from Last 3 Encounters:  05/30/12 130/90  04/14/12 118/60  07/09/11 130/86   Good control. Renal function and electrolytes are normal  Plan Continue present regimen

## 2012-08-16 ENCOUNTER — Other Ambulatory Visit: Payer: Self-pay | Admitting: Internal Medicine

## 2012-09-07 ENCOUNTER — Other Ambulatory Visit: Payer: Self-pay | Admitting: Internal Medicine

## 2013-01-25 ENCOUNTER — Other Ambulatory Visit: Payer: Self-pay

## 2013-02-13 ENCOUNTER — Other Ambulatory Visit: Payer: Self-pay | Admitting: Internal Medicine

## 2013-02-13 ENCOUNTER — Encounter: Payer: Self-pay | Admitting: Internal Medicine

## 2013-03-28 ENCOUNTER — Ambulatory Visit (INDEPENDENT_AMBULATORY_CARE_PROVIDER_SITE_OTHER): Payer: 59 | Admitting: Internal Medicine

## 2013-03-28 ENCOUNTER — Encounter: Payer: Self-pay | Admitting: Internal Medicine

## 2013-03-28 VITALS — BP 130/72 | HR 84 | Temp 99.5°F | Resp 16

## 2013-03-28 DIAGNOSIS — B9789 Other viral agents as the cause of diseases classified elsewhere: Principal | ICD-10-CM

## 2013-03-28 DIAGNOSIS — J069 Acute upper respiratory infection, unspecified: Secondary | ICD-10-CM

## 2013-03-28 MED ORDER — OSELTAMIVIR PHOSPHATE 75 MG PO CAPS: 75.0000 mg | ORAL_CAPSULE | Freq: Two times a day (BID) | ORAL | Status: DC

## 2013-03-28 MED ORDER — PROMETHAZINE-CODEINE 6.25-10 MG/5ML PO SYRP
5.0000 mL | ORAL_SOLUTION | ORAL | Status: DC | PRN
Start: 2013-03-28 — End: 2013-09-26

## 2013-03-28 NOTE — Progress Notes (Signed)
Patient ID: Stephen [name], male   DOB: Oct 10, 1960, 53 y.o.   MRN: [id number]   Subjective:   HPI  complains of flu-like symptoms  Onset <48h ago, progressively worse symptoms  associated with fever, headache, myalgia and exhaustion Also mild nasal congestion, sneezing, sore throat, and cough Minimal relief with OTC meds Precipitated by sick contacts  Past Medical History  Diagnosis Date  . High cholesterol   . Colon polyps   . Hypertension   . Detached retina, right 1992    Dr. [name] at [facility] did [facility] band procedure with good results.     Review of Systems Constitutional: No unexpected weight change Pulmonary: No pleurisy or hemoptysis Cardiovascular: No chest pain or palpitations     Objective:   Physical Exam BP 130/72  Pulse 84  Temp(Src) 99.5 F (37.5 C) (Oral)  Resp 16 GEN: mildly ill appearing, fatigued HENT: NCAT, no sinus tenderness bilaterally, nares with clear discharge, oropharynx mild erythema, no exudate Eyes: Vision grossly intact, no conjunctivitis Neck: shoddy anterior LAD, supple with FROM Lungs: Clear to auscultation without rhonchi or wheeze, no increased work of breathing Cardiovascular: Regular rate and rhythm, no bilateral edema  Lab Results  Component Value Date   WBC 9.2 09/14/2010   HGB 14.9 09/14/2010   HCT 41.9 09/14/2010   PLT 177 09/14/2010   GLUCOSE 94 05/30/2012   CHOL 147 05/30/2012   TRIG 73.0 05/30/2012   HDL 52.30 05/30/2012   LDLCALC 80 05/30/2012   ALT 32 05/30/2012   ALT 32 05/30/2012   AST 25 05/30/2012   AST 25 05/30/2012   NA 140 05/30/2012   K 4.3 05/30/2012   CL 102 05/30/2012   CREATININE 1.0 05/30/2012   BUN 17 05/30/2012   CO2 30 05/30/2012      Assessment & Plan:   Viral URI - symptoms clinically consistent with influenza    Explained lack of efficacy for traditional antibiotics in viral disease  Tamiflu prescribed + prescription cough suppression - new prescriptions done  Symptomatic care with Tylenol and/or  Advil, hydration and rest - also OTC decongestants, antihistamines, and/or antitussives and salt gargle advised as needed  Pt education provided

## 2013-03-28 NOTE — Patient Instructions (Addendum)
It was good to see you today.  Tamiflu antibiotics and prescription cough syrup - Your prescription(s) have been submitted to your pharmacy (syrup prescription given to you to take to pharmacy). Please take as directed and contact our office if you believe you are having problem(s) with the medication(s).  Alternate between ibuprofen and tylenol for aches, pain and fever symptoms as discussed  Hydrate, rest and call if worse or unimproved Upper Respiratory Infection, Adult An upper respiratory infection (URI) is also sometimes known as the common cold. The upper respiratory tract includes the nose, sinuses, throat, trachea, and bronchi. Bronchi are the airways leading to the lungs. Most people improve within 1 week, but symptoms can last up to 2 weeks. A residual cough may last even longer.  CAUSES Many different viruses can infect the tissues lining the upper respiratory tract. The tissues become irritated and inflamed and often become very moist. Mucus production is also common. A cold is contagious. You can easily spread the virus to others by oral contact. This includes kissing, sharing a glass, coughing, or sneezing. Touching your mouth or nose and then touching a surface, which is then touched by another person, can also spread the virus. SYMPTOMS  Symptoms typically develop 1 to 3 days after you come in contact with a cold virus. Symptoms vary from person to person. They may include:  Runny nose.  Sneezing.  Nasal congestion.  Sinus irritation.  Sore throat.  Loss of voice (laryngitis).  Cough.  Fatigue.  Muscle aches.  Loss of appetite.  Headache.  Low-grade fever. DIAGNOSIS  You might diagnose your own cold based on familiar symptoms, since most people get a cold 2 to 3 times a year. Your caregiver can confirm this based on your exam. Most importantly, your caregiver can check that your symptoms are not due to another disease such as strep throat, sinusitis, pneumonia,  asthma, or epiglottitis. Blood tests, throat tests, and X-rays are not necessary to diagnose a common cold, but they may sometimes be helpful in excluding other more serious diseases. Your caregiver will decide if any further tests are required. RISKS AND COMPLICATIONS  You may be at risk for a more severe case of the common cold if you smoke cigarettes, have chronic heart disease (such as heart failure) or lung disease (such as asthma), or if you have a weakened immune system. The very young and very old are also at risk for more serious infections. Bacterial sinusitis, middle ear infections, and bacterial pneumonia can complicate the common cold. The common cold can worsen asthma and chronic obstructive pulmonary disease (COPD). Sometimes, these complications can require emergency medical care and may be life-threatening. PREVENTION  The best way to protect against getting a cold is to practice good hygiene. Avoid oral or hand contact with people with cold symptoms. Wash your hands often if contact occurs. There is no clear evidence that vitamin C, vitamin E, echinacea, or exercise reduces the chance of developing a cold. However, it is always recommended to get plenty of rest and practice good nutrition. TREATMENT  Treatment is directed at relieving symptoms. There is no cure. Antibiotics are not effective, because the infection is caused by a virus, not by bacteria. Treatment may include:  Increased fluid intake. Sports drinks offer valuable electrolytes, sugars, and fluids.  Breathing heated mist or steam (vaporizer or shower).  Eating chicken soup or other clear broths, and maintaining good nutrition.  Getting plenty of rest.  Using gargles or lozenges for comfort.  Controlling fevers with ibuprofen or acetaminophen as directed by your caregiver.  Increasing usage of your inhaler if you have asthma. Zinc gel and zinc lozenges, taken in the first 24 hours of the common cold, can shorten the  duration and lessen the severity of symptoms. Pain medicines may help with fever, muscle aches, and throat pain. A variety of non-prescription medicines are available to treat congestion and runny nose. Your caregiver can make recommendations and may suggest nasal or lung inhalers for other symptoms.  HOME CARE INSTRUCTIONS   Only take over-the-counter or prescription medicines for pain, discomfort, or fever as directed by your caregiver.  Use a warm mist humidifier or inhale steam from a shower to increase air moisture. This may keep secretions moist and make it easier to breathe.  Drink enough water and fluids to keep your urine clear or pale yellow.  Rest as needed.  Return to work when your temperature has returned to normal or as your caregiver advises. You may need to stay home longer to avoid infecting others. You can also use a face mask and careful hand washing to prevent spread of the virus. SEEK MEDICAL CARE IF:   After the first few days, you feel you are getting worse rather than better.  You need your caregiver's advice about medicines to control symptoms.  You develop chills, worsening shortness of breath, or brown or red sputum. These may be signs of pneumonia.  You develop yellow or brown nasal discharge or pain in the face, especially when you bend forward. These may be signs of sinusitis.  You develop a fever, swollen neck glands, pain with swallowing, or white areas in the back of your throat. These may be signs of strep throat. SEEK IMMEDIATE MEDICAL CARE IF:   You have a fever.  You develop severe or persistent headache, ear pain, sinus pain, or chest pain.  You develop wheezing, a prolonged cough, cough up blood, or have a change in your usual mucus (if you have chronic lung disease).  You develop sore muscles or a stiff neck. Document Released: 09/01/2000 Document Revised: 05/31/2011 Document Reviewed: 07/10/2010 [facility] Patient Information 2014 [place],  [place].

## 2013-03-28 NOTE — Progress Notes (Signed)
Pre visit review using our clinic review tool, if applicable. No additional management support is needed unless otherwise documented below in the visit note. 

## 2013-06-11 ENCOUNTER — Encounter: Payer: Self-pay | Admitting: *Deleted

## 2013-06-11 ENCOUNTER — Encounter: Payer: Self-pay | Admitting: Family Medicine

## 2013-06-11 ENCOUNTER — Ambulatory Visit (INDEPENDENT_AMBULATORY_CARE_PROVIDER_SITE_OTHER): Payer: 59 | Admitting: Family Medicine

## 2013-06-11 VITALS — BP 152/90 | HR 74

## 2013-06-11 DIAGNOSIS — M545 Low back pain, unspecified: Secondary | ICD-10-CM

## 2013-06-11 DIAGNOSIS — IMO0002 Reserved for concepts with insufficient information to code with codable children: Secondary | ICD-10-CM

## 2013-06-11 DIAGNOSIS — S39012A Strain of muscle, fascia and tendon of lower back, initial encounter: Secondary | ICD-10-CM | POA: Insufficient documentation

## 2013-06-11 MED ORDER — MELOXICAM 15 MG PO TABS: 15.0000 mg | ORAL_TABLET | Freq: Every day | ORAL | Status: DC

## 2013-06-11 MED ORDER — KETOROLAC TROMETHAMINE 60 MG/2ML IM SOLN
60.0000 mg | Freq: Once | INTRAMUSCULAR | Status: AC
Start: 2013-06-11 — End: 2013-06-11
  Administered 2013-06-11: 60 mg via INTRAMUSCULAR

## 2013-06-11 MED ORDER — CYCLOBENZAPRINE HCL 10 MG PO TABS: 10.0000 mg | ORAL_TABLET | Freq: Three times a day (TID) | ORAL | Status: DC | PRN

## 2013-06-11 MED ORDER — METHYLPREDNISOLONE ACETATE 80 MG/ML IJ SUSP
80.0000 mg | Freq: Once | INTRAMUSCULAR | Status: AC
  Administered 2013-06-11: 80 mg via INTRAMUSCULAR

## 2013-06-11 NOTE — Progress Notes (Signed)
  [name] [address], [place] [address] Phone: [phone] Subjective:    I'm seeing this patient by the request  OX:[id number] Norins, MD     CC: Acute low back pain  MEQ:[id number] Stephen [name] is a 53 y.o. male coming in with complaint of acute low back pain. Patient states he started having left-sided low back pain when he started shoveling a couple weeks ago. Patient states since that time this pain has gotten worse and worse. Patient states that it seems to be localized on the left side with no radiation down the legs any numbness or tingling and denies any weakness of the lower extremity. Patient also denies any bowel or bladder incontinence. Patient has tried over-the-counter anti-inflammatories with no significant improvement. Patient found it difficult to even concentrator work secondary to pain. Patient states worse when going from seated to standing position and states it is very difficult going from a lying to a sitting position. States the pain seems to be worse with flexion. Only feels good with no movement.     Past medical history, social, surgical and family history all reviewed in electronic medical record.   Review of Systems: No headache, visual changes, nausea, vomiting, diarrhea, constipation, dizziness, abdominal pain, skin rash, fevers, chills, night sweats, weight loss, swollen lymph nodes, body aches, joint swelling, muscle aches, chest pain, shortness of breath, mood changes.   Objective Blood pressure 152/90, pulse 74, SpO2 98.00%.  General: No apparent distress alert and oriented x3 mood and affect normal, dressed appropriately.  HEENT: Pupils equal, extraocular movements intact  Respiratory: Patient's speak in full sentences and does not appear short of breath  Cardiovascular: No lower extremity edema, non tender, no erythema  Skin: Warm dry intact with no signs of infection or rash on extremities or on axial skeleton.    Abdomen: Soft nontender  Neuro: Cranial nerves II through XII are intact, neurovascularly intact in all extremities with 2+ DTRs and 2+ pulses.  Lymph: No lymphadenopathy of posterior or anterior cervical chain or axillae bilaterally.  Gait normal with good balance and coordination.  MSK:  Non tender with full range of motion and good stability and symmetric strength and tone of shoulders, elbows, wrist, hip, knee and ankles bilaterally.  Back Exam:  Inspection: Unremarkable  Motion: Flexion 25 deg, Extension 25 deg, Side Bending to 25 deg bilaterally,  Rotation to 25 deg bilaterally  SLR laying: Negative  XSLR laying: Negative  Palpable tenderness: Patient is tender to palpation over the paraspinal musculature and iliolumbar ligament on the left side FABER: negative. Sensory change: Gross sensation intact to all lumbar and sacral dermatomes.  Reflexes: 2+ at both patellar tendons, 2+ at achilles tendons, Babinski's downgoing.  Strength at foot  Plantar-flexion: 5/5 Dorsi-flexion: 5/5 Eversion: 5/5 Inversion: 5/5  Leg strength  Quad: 5/5 Hamstring: 5/5 Hip flexor: 5/5 Hip abductors: 5/5  Gait unremarkable cautious     Impression and Recommendations:     This case required medical decision making of moderate complexity.

## 2013-06-11 NOTE — Patient Instructions (Signed)
Good to meet you Ice 20 minutes 2 times a day Avoid heat for now Meloxicam daily for 10 days then as needed Flexeril as needed but may make you sleepy Exercises start in 48 hours Come back in 1 week if not perfect

## 2013-06-11 NOTE — Assessment & Plan Note (Signed)
Patient given home exercise program. Patient given an injection of Depo-Medrol as well as Toradol today. Medications per orders. We discussed icing and set of heat and home exercises to start in 48 hours. Patient will come back again in one week. If he continues to have pain we'll consider imaging and may consider manipulation if completely improve.

## 2013-06-17 ENCOUNTER — Encounter: Payer: Self-pay | Admitting: Family Medicine

## 2013-08-17 ENCOUNTER — Other Ambulatory Visit: Payer: Self-pay

## 2013-08-17 MED ORDER — LISINOPRIL 20 MG PO TABS: 20.0000 mg | ORAL_TABLET | Freq: Every day | ORAL | Status: DC

## 2013-09-06 ENCOUNTER — Telehealth: Payer: Self-pay | Admitting: *Deleted

## 2013-09-06 MED ORDER — PRAVASTATIN SODIUM 40 MG PO TABS: ORAL_TABLET | ORAL | Status: DC

## 2013-09-06 NOTE — Telephone Encounter (Signed)
Refill done.  

## 2013-09-26 ENCOUNTER — Encounter: Payer: Self-pay | Admitting: Internal Medicine

## 2013-09-26 ENCOUNTER — Ambulatory Visit (INDEPENDENT_AMBULATORY_CARE_PROVIDER_SITE_OTHER): Payer: 59 | Admitting: Internal Medicine

## 2013-09-26 VITALS — BP 108/72 | HR 71 | Temp 98.2°F | Ht 75.0 in | Wt 184.8 lb

## 2013-09-26 DIAGNOSIS — J028 Acute pharyngitis due to other specified organisms: Secondary | ICD-10-CM

## 2013-09-26 DIAGNOSIS — J029 Acute pharyngitis, unspecified: Secondary | ICD-10-CM | POA: Insufficient documentation

## 2013-09-26 MED ORDER — AZITHROMYCIN 250 MG PO TABS: ORAL_TABLET | ORAL | Status: DC

## 2013-09-26 NOTE — Progress Notes (Signed)
Pre visit review using our clinic review tool, if applicable. No additional management support is needed unless otherwise documented below in the visit note. 

## 2013-09-26 NOTE — Assessment & Plan Note (Signed)
Mild to mod, for antibx course,  to f/u any worsening symptoms or concerns 

## 2013-09-26 NOTE — Progress Notes (Signed)
   Subjective:    Patient ID: Nolyn Swab, male    DOB: [birth year], 53 y.o.   MRN: [id number]  HPI   Here with 2-3 days acute onset fever, severe ST, pressure, headache, general weakness and malaise, and nonprod cough, but pt denies chest pain, wheezing, increased sob or doe, orthopnea, PND, increased LE swelling, palpitations, dizziness or syncope.  Past Medical History  Diagnosis Date  . High cholesterol   . Colon polyps   . Hypertension   . Detached retina, right 1992    Dr. [name] at [facility] did orbital band procedure with good results.    Past Surgical History  Procedure Laterality Date  . Retinal detachment surgery      reports that he has never smoked. He has never used smokeless tobacco. He reports that he drinks about .5 ounces of alcohol per week. He reports that he does not use illicit drugs. family history includes Cancer in his father; Hyperlipidemia in his father; Hypertension in his brother and father; Parkinsonism in his mother. There is no history of Diabetes or Heart disease. No Known Allergies Current Outpatient Prescriptions on File Prior to Visit  Medication Sig Dispense Refill  . fish oil-omega-3 fatty acids 1000 MG capsule Take 2 g by mouth daily.        Marland Kitchen lisinopril (PRINIVIL,ZESTRIL) 20 MG tablet Take 1 tablet (20 mg total) by mouth daily.  30 tablet  5  . Multiple Vitamin (MULTIVITAMIN) tablet Take 1 tablet by mouth daily.        . pravastatin (PRAVACHOL) 40 MG tablet TAKE 1 TABLET BY MOUTH DAILY.  90 tablet  0   No current facility-administered medications on file prior to visit.   Review of Systems All otherwise neg per pt     Objective:   Physical Exam BP 108/72  Pulse 71  Temp(Src) 98.2 F (36.8 C) (Oral)  Ht 6\' 3"  (1.905 m)  Wt 184 lb 12 oz (83.802 kg)  BMI 23.09 kg/m2  SpO2 98% VS noted, mild ill Constitutional: Pt appears well-developed, well-nourished.  HENT: Head: NCAT.  Right Ear: External ear normal.  Left Ear: External ear normal.    Bilat tm's with mild erythema.  Max sinus areas non tender.  Pharynx with severe erythema, + exudate Eyes: . Pupils are equal, round, and reactive to light. Conjunctivae and EOM are normal Neck: Normal range of motion. Neck supple.  Cardiovascular: Normal rate and regular rhythm.   Pulmonary/Chest: Effort normal and breath sounds normal.  Neurological: Pt is alert. Not confused , motor grossly intact Skin: Skin is warm. No rash Psychiatric: Pt behavior is normal. No agitation.     Assessment & Plan:

## 2013-09-26 NOTE — Patient Instructions (Signed)
Please take all new medication as prescribed  Please continue all other medications as before, and refills have been done if requested.  Please have the pharmacy call with any other refills you may need.  Please keep your appointments with your specialists as you may have planned     

## 2013-10-03 ENCOUNTER — Telehealth: Payer: Self-pay | Admitting: Internal Medicine

## 2013-10-03 MED ORDER — AZITHROMYCIN 250 MG PO TABS: ORAL_TABLET | ORAL | Status: DC

## 2013-10-03 MED ORDER — HYDROCODONE-HOMATROPINE 5-1.5 MG/5ML PO SYRP: 5.0000 mL | ORAL_SOLUTION | Freq: Four times a day (QID) | ORAL | Status: DC | PRN

## 2013-10-03 NOTE — Telephone Encounter (Signed)
Patient informed script sent in and cough medication needs to have hardcopy picked up.

## 2013-10-03 NOTE — Telephone Encounter (Signed)
Patient still has cough and sore throat from last ov.  He would like to know if he should refill the zpak or if he should try something else.  Please advise. Thanks!

## 2013-10-03 NOTE — Telephone Encounter (Signed)
Azithromycin #6 , ONE qd Hydromet 1 tsp q 6 hrs prn  OVINB

## 2013-10-19 ENCOUNTER — Encounter: Payer: Self-pay | Admitting: Family Medicine

## 2013-10-19 ENCOUNTER — Ambulatory Visit (INDEPENDENT_AMBULATORY_CARE_PROVIDER_SITE_OTHER): Payer: 59 | Admitting: Family Medicine

## 2013-10-19 VITALS — BP 118/72 | HR 65 | Temp 97.5°F | Ht 75.0 in | Wt 184.5 lb

## 2013-10-19 DIAGNOSIS — J301 Allergic rhinitis due to pollen: Secondary | ICD-10-CM

## 2013-10-19 DIAGNOSIS — J069 Acute upper respiratory infection, unspecified: Secondary | ICD-10-CM

## 2013-10-19 DIAGNOSIS — J020 Streptococcal pharyngitis: Secondary | ICD-10-CM

## 2013-10-19 LAB — POCT RAPID STREP A (OFFICE): Rapid Strep A Screen: NEGATIVE

## 2013-10-19 NOTE — Progress Notes (Signed)
  Garret Reddish, MD Phone: [phone]  Subjective:   Stephen [name] is a 53 y.o. year old very pleasant male patient who presents with the following:  Sore throat/congestion/cough Saw Dr. Jenny Reichmann on 7/8 and diagnosed with strep pharyngitis by history and exam, without culture. Treated with azithromycin and did not really improve. Had second round called in on 7/15 and several days later started feeling better. Symptoms resolved.   Started feeling similar to earlier this month yesterday morning with cough and sore throat and mild congestion. Gradually worsening since onset. Nonproductive or clear sputum. Treatments-hycodan, tylenol, zyrtec with mild relief. Pain 3/10 ache compared to 8/10. Does admit to sneezing and rhinorrhea.   ROS- No fever/chills/nausea/vomiting. Mild fatigue.   Past Medical History- pharyngitis (strep 2 weeks ago). Back pain. HLD, HTN  Medications- reviewed and updated Current Outpatient Prescriptions  Medication Sig Dispense Refill  . fish oil-omega-3 fatty acids 1000 MG capsule Take 2 g by mouth daily.        Marland Kitchen HYDROcodone-homatropine (HYCODAN) 5-1.5 MG/5ML syrup Take 5 mLs by mouth every 6 (six) hours as needed for cough.  120 mL  0  . lisinopril (PRINIVIL,ZESTRIL) 20 MG tablet Take 1 tablet (20 mg total) by mouth daily.  30 tablet  5  . Multiple Vitamin (MULTIVITAMIN) tablet Take 1 tablet by mouth daily.        . pravastatin (PRAVACHOL) 40 MG tablet TAKE 1 TABLET BY MOUTH DAILY.  90 tablet  0   No current facility-administered medications for this visit.    Objective: BP 118/72  Pulse 65  Temp(Src) 97.5 F (36.4 C) (Oral)  Ht 6\' 3"  (1.905 m)  Wt 184 lb 8 oz (83.689 kg)  BMI 23.06 kg/m2  SpO2 99% Gen: NAD, resting comfortably HEENT: bluish tint to turbinates. Pharynx erythematous with cobblestoning noted. Tonsils without enlargement or exudate.  TM normal bilaterally, Mucous membranes are moist. CV: RRR no murmurs rubs or gallops Lungs: CTAB no crackles,  wheeze, rhonchi Ext: no edema Skin: warm, dry, no rash  Assessment/Plan:  Upper Respiratory Infection with sore throat Centor 0/5 and rapid strep negative. Given recent treatment for strep want to rule out strep infection with culture and this was sent. Improvement with 2nd course of azithromycin but may have been viral given time couse.  I suspect patient may have some allergies given nasal appearance and sneezing/rhinorrhea-I advised him to add daily zyrtec.  In addition, discussed symptomatic care for URI and time course expected of illness. Given reasons for return   Orders Placed This Encounter  Procedures  . Throat culture [facility])  . POCT rapid strep A

## 2013-10-19 NOTE — Patient Instructions (Addendum)
  Upper Respiratory Infection with possible allergic rhinitis component.  We will still send this for culture to be sure that it is not strep. Will call you if positive.  The most important thing is to get a lot of rest and stay well hydrated.  I would take a daily zyrtec for next 2 weeks. You can try salt water gargles, aleve to reduce inflammation and pain.  If you develop fevers,  symptoms beyond the time we discussed- 2 weeks, worsening of symptoms after they get better, please schedule a follow up visit or give [place] a call for advice.

## 2013-10-19 NOTE — Progress Notes (Signed)
Pre visit review using our clinic review tool, if applicable. No additional management support is needed unless otherwise documented below in the visit note. 

## 2013-10-21 LAB — CULTURE, GROUP A STREP: ORGANISM ID, BACTERIA: NORMAL

## 2013-12-06 ENCOUNTER — Other Ambulatory Visit: Payer: Self-pay | Admitting: Family Medicine

## 2013-12-06 NOTE — Telephone Encounter (Signed)
Refill done.  

## 2014-01-10 ENCOUNTER — Encounter: Payer: Self-pay | Admitting: Internal Medicine

## 2014-02-01 ENCOUNTER — Encounter: Payer: Self-pay | Admitting: Internal Medicine

## 2014-02-01 ENCOUNTER — Ambulatory Visit (INDEPENDENT_AMBULATORY_CARE_PROVIDER_SITE_OTHER): Payer: 59 | Admitting: Internal Medicine

## 2014-02-01 ENCOUNTER — Other Ambulatory Visit (INDEPENDENT_AMBULATORY_CARE_PROVIDER_SITE_OTHER): Payer: 59

## 2014-02-01 VITALS — BP 138/64 | HR 68 | Temp 97.8°F | Resp 16 | Ht 75.0 in | Wt 192.8 lb

## 2014-02-01 DIAGNOSIS — E785 Hyperlipidemia, unspecified: Secondary | ICD-10-CM

## 2014-02-01 DIAGNOSIS — Z0000 Encounter for general adult medical examination without abnormal findings: Secondary | ICD-10-CM

## 2014-02-01 DIAGNOSIS — Z1211 Encounter for screening for malignant neoplasm of colon: Secondary | ICD-10-CM

## 2014-02-01 DIAGNOSIS — I10 Essential (primary) hypertension: Secondary | ICD-10-CM

## 2014-02-01 LAB — LIPID PANEL
CHOLESTEROL: 160 mg/dL (ref 0–200)
HDL: 50.7 mg/dL (ref >39.00)
LDL CALC: 97 mg/dL (ref 0–99)
NonHDL: 109.3
Total CHOL/HDL Ratio: 3
Triglycerides: 60 mg/dL (ref 0.0–149.0)
VLDL: 12 mg/dL (ref 0.0–40.0)

## 2014-02-01 LAB — BASIC METABOLIC PANEL
BUN: 16 mg/dL (ref 6–23)
CALCIUM: 9.8 mg/dL (ref 8.4–10.5)
CO2: 30 meq/L (ref 19–32)
Chloride: 104 mEq/L (ref 96–112)
Creatinine, Ser: 1 mg/dL (ref 0.4–1.5)
GFR: 81.07 mL/min (ref >60.00)
GLUCOSE: 103 mg/dL — AB (ref 70–99)
Potassium: 4.5 mEq/L (ref 3.5–5.1)
SODIUM: 141 meq/L (ref 135–145)

## 2014-02-01 MED ORDER — LISINOPRIL 20 MG PO TABS: 20.0000 mg | ORAL_TABLET | Freq: Every day | ORAL | Status: DC

## 2014-02-01 MED ORDER — PRAVASTATIN SODIUM 40 MG PO TABS: 40.0000 mg | ORAL_TABLET | Freq: Every day | ORAL | Status: DC

## 2014-02-01 NOTE — Assessment & Plan Note (Signed)
Overdue for repeat colonoscopy given that he had polyps on last exam. Referred him back to GI at [facility]. He has gotten flu shot already. Up-to-date on tetanus.

## 2014-02-01 NOTE — Progress Notes (Signed)
Pre visit review using our clinic review tool, if applicable. No additional management support is needed unless otherwise documented below in the visit note. 

## 2014-02-01 NOTE — Patient Instructions (Signed)
We have sent in your refills for your medications. We will also check blood work on you today. We will call you back with those results or they will be available on my chart.  We will send her back to [facility] for your next colonoscopy.  Come back next year for your annual physical.  If you have any problems or questions before your next visit please feel free to call our office.  Exercise to Stay Healthy Exercise helps you become and stay healthy. EXERCISE IDEAS AND TIPS Choose exercises that:  You enjoy.  Fit into your day. You do not need to exercise really hard to be healthy. You can do exercises at a slow or medium level and stay healthy. You can:  Stretch before and after working out.  Try yoga, Pilates, or tai chi.  Lift weights.  Walk fast, swim, jog, run, climb stairs, bicycle, dance, or rollerskate.  Take aerobic classes. Exercises that burn about 150 calories:  Running 1  miles in 15 minutes.  Playing volleyball for 45 to 60 minutes.  Washing and waxing a car for 45 to 60 minutes.  Playing touch football for 45 minutes.  Walking 1  miles in 35 minutes.  Pushing a stroller 1  miles in 30 minutes.  Playing basketball for 30 minutes.  Raking leaves for 30 minutes.  Bicycling 5 miles in 30 minutes.  Walking 2 miles in 30 minutes.  Dancing for 30 minutes.  Shoveling snow for 15 minutes.  Swimming laps for 20 minutes.  Walking up stairs for 15 minutes.  Bicycling 4 miles in 15 minutes.  Gardening for 30 to 45 minutes.  Jumping rope for 15 minutes.  Washing windows or floors for 45 to 60 minutes. Document Released: 04/10/2010 Document Revised: 05/31/2011 Document Reviewed: 04/10/2010 [facility] Patient Information 2015 [place], [place]. This information is not intended to replace advice given to you by your health care provider. Make sure you discuss any questions you have with your health care provider.

## 2014-02-01 NOTE — Assessment & Plan Note (Signed)
Continue pravastatin. Check lipid panel today.  

## 2014-02-01 NOTE — Assessment & Plan Note (Signed)
Blood pressure well controlled on lisinopril. Check basic metabolic panel.

## 2014-02-01 NOTE — Progress Notes (Signed)
   Subjective:    Patient ID: Stephen [name], male    DOB: [birth year], 53 y.o.   MRN: [id number]  HPI The patient is a 53 year old male who comes in today to establish care. He has past medical history of back strain, high blood pressure, hyperlipidemia. He does very well overall and tries exercise. He tries to eat healthfully. He denies any new problems, any back strain. He is due for repeat colonoscopy as he has history of polyps, he got his last one done at [facility]. He is a nonsmoker.  Review of Systems  Constitutional: Negative for fever, activity change, appetite change, fatigue and unexpected weight change.  HENT: Negative.   Respiratory: Negative for cough, chest tightness, shortness of breath and wheezing.   Cardiovascular: Negative for chest pain, palpitations and leg swelling.  Gastrointestinal: Negative for abdominal pain, diarrhea, constipation, blood in stool and abdominal distention.  Genitourinary: Negative.   Musculoskeletal: Negative.   Skin: Negative.   Neurological: Negative.   Psychiatric/Behavioral: Negative.       Objective:   Physical Exam  Constitutional: He is oriented to person, place, and time. He appears well-developed and well-nourished.  HENT:  Head: Normocephalic and atraumatic.  Eyes: EOM are normal.  Neck: Normal range of motion.  Cardiovascular: Normal rate and regular rhythm.   No murmur heard. Pulmonary/Chest: Effort normal and breath sounds normal. No respiratory distress. He has no wheezes. He has no rales.  Abdominal: Soft. Bowel sounds are normal. He exhibits no distension. There is no tenderness. There is no rebound.  Neurological: He is alert and oriented to person, place, and time. Coordination normal.  Skin: Skin is warm and dry.   Filed Vitals:   02/01/14 1054  BP: 138/64  Pulse: 68  Temp: 97.8 F (36.6 C)  TempSrc: Oral  Resp: 16  Height: 6\' 3"  (1.905 m)  Weight: 192 lb 12.8 oz (87.454 kg)  SpO2: 98%      Assessment & Plan:

## 2014-04-08 ENCOUNTER — Other Ambulatory Visit: Payer: Self-pay | Admitting: Internal Medicine

## 2014-04-08 MED ORDER — AMOXICILLIN-POT CLAVULANATE 875-125 MG PO TABS: 1.0000 | ORAL_TABLET | Freq: Two times a day (BID) | ORAL | Status: DC

## 2014-09-06 ENCOUNTER — Other Ambulatory Visit: Payer: Self-pay | Admitting: Gastroenterology

## 2014-09-06 HISTORY — PX: COLONOSCOPY: SHX174

## 2015-02-04 ENCOUNTER — Other Ambulatory Visit: Payer: Self-pay | Admitting: Internal Medicine

## 2015-03-11 ENCOUNTER — Other Ambulatory Visit: Payer: Self-pay | Admitting: Internal Medicine

## 2015-05-07 MED FILL — LISINOPRIL 20 MG TABLET: 20 | 90 days supply | Qty: 90 | Fill #1

## 2015-06-11 MED FILL — PRAVASTATIN NA 40 MG TAB: 40 | 90 days supply | Qty: 90 | Fill #1

## 2015-07-15 ENCOUNTER — Ambulatory Visit (INDEPENDENT_AMBULATORY_CARE_PROVIDER_SITE_OTHER): Payer: 59 | Admitting: Family Medicine

## 2015-07-15 ENCOUNTER — Encounter: Payer: Self-pay | Admitting: Family Medicine

## 2015-07-15 VITALS — BP 132/72 | HR 66 | Temp 97.9°F | Ht 75.0 in | Wt 204.0 lb

## 2015-07-15 DIAGNOSIS — I10 Essential (primary) hypertension: Secondary | ICD-10-CM

## 2015-07-15 DIAGNOSIS — L237 Allergic contact dermatitis due to plants, except food: Secondary | ICD-10-CM | POA: Diagnosis not present

## 2015-07-15 MED ORDER — METHYLPREDNISOLONE 4 MG PO TBPK: ORAL_TABLET | ORAL | Status: DC

## 2015-07-15 MED ORDER — TRIAMCINOLONE ACETONIDE 0.1 % EX CREA: 1.0000 "application " | TOPICAL_CREAM | Freq: Two times a day (BID) | CUTANEOUS | Status: DC

## 2015-07-15 NOTE — Patient Instructions (Signed)
Use dawn dishwasher to cut oil from off clothes.   Poison [employer] ivy is a inflammation of the skin (contact dermatitis) caused by touching the allergens on the leaves of the ivy plant following previous exposure to the plant. The rash usually appears 48 hours after exposure. The rash is usually bumps (papules) or blisters (vesicles) in a linear pattern. Depending on your own sensitivity, the rash may simply cause redness and itching, or it may also progress to blisters which may break open. These must be well cared for to prevent secondary bacterial (germ) infection, followed by scarring. Keep any open areas dry, clean, dressed, and covered with an antibacterial ointment if needed. The eyes may also get puffy. The puffiness is worst in the morning and gets better as the day progresses. This dermatitis usually heals without scarring, within 2 to 3 weeks without treatment. HOME CARE INSTRUCTIONS  Thoroughly wash with soap and water as soon as you have been exposed to poison ivy. You have about one half hour to remove the plant resin before it will cause the rash. This washing will destroy the oil or antigen on the skin that is causing, or will cause, the rash. Be sure to wash under your fingernails as any plant resin there will continue to spread the rash. Do not rub skin vigorously when washing affected area. Poison ivy cannot spread if no oil from the plant remains on your body. A rash that has progressed to weeping sores will not spread the rash unless you have not washed thoroughly. It is also important to wash any clothes you have been wearing as these may carry active allergens. The rash will return if you wear the unwashed clothing, even several days later. Avoidance of the plant in the future is the best measure. Poison ivy plant can be recognized by the number of leaves. Generally, poison ivy has three leaves with flowering branches on a single stem. Diphenhydramine may be purchased over the  counter and used as needed for itching. Do not drive with this medication if it makes you drowsy.Ask your caregiver about medication for children. SEEK MEDICAL CARE IF:  Open sores develop.  Redness spreads beyond area of rash.  You notice purulent (pus-like) discharge.  You have increased pain.  Other signs of infection develop (such as fever).   This information is not intended to replace advice given to you by your health care provider. Make sure you discuss any questions you have with your health care provider.   Document Released: 03/05/2000 Document Revised: 05/31/2011 Document Reviewed: 08/14/2014 Elsevier Interactive Patient Education [employer].

## 2015-07-15 NOTE — Progress Notes (Signed)
Subjective:    Patient ID: Stephen Dimmer., male    DOB: 1960-09-07, 54 y.o.   MRN: [id number]  Chief Complaint  Patient presents with  . Poison Ivy    HPI Patient is in today for poison ivy on bilateral arms and legs. It has been worsening over the past few days. No other complaints.Denies CP/palp/SOB/HA/congestion/fevers/GI or GU c/o. Taking meds as prescribed    Past Medical History  Diagnosis Date  . High cholesterol   . Colon polyps   . Hypertension   . Detached retina, right 1992    Dr. [name] at [facility] did orbital band procedure with good results.   . Poison ivy dermatitis 07/29/2015    Past Surgical History  Procedure Laterality Date  . Retinal detachment surgery      Family History  Problem Relation Age of Onset  . Parkinsonism Mother   . Cancer Father     colon cancer with mets  . Hyperlipidemia Father   . Hypertension Father   . Hypertension Brother   . Diabetes Neg Hx   . Heart disease Neg Hx     Social History   Social History  . Marital Status: Married    Spouse Name: N/A  . Number of Children: 2  . Years of Education: 16   Occupational History  . busines [employer]   Social History Main Topics  . Smoking status: Never Smoker   . Smokeless tobacco: Never Used  . Alcohol Use: 0.5 oz/week    1 drink(s) per week  . Drug Use: No  . Sexual Activity:    Partners: Female   Other Topics Concern  . Not on file   Social History Narrative   HSG, [employer] - BS business admin. Married '84 - 1 son '86, 1 dtr -'26. 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 ACP discussed and he is referred to [email or web address].    Outpatient Prescriptions Prior to Visit  Medication Sig Dispense Refill  . fish oil-omega-3 fatty acids 1000 MG capsule Take 2 g by mouth daily.      Marland Kitchen lisinopril (PRINIVIL,ZESTRIL) 20 MG tablet TAKE 1 TABLET (20 MG) BY MOUTH DAILY. 90 tablet 3  . Multiple  Vitamin (MULTIVITAMIN) tablet Take 1 tablet by mouth daily.      . pravastatin (PRAVACHOL) 40 MG tablet TAKE 1 TABLET (40 MG) BY MOUTH DAILY. 90 tablet 3  . amoxicillin-clavulanate (AUGMENTIN) 875-125 MG per tablet Take 1 tablet by mouth 2 (two) times daily. 14 tablet 0   No facility-administered medications prior to visit.    No Known Allergies  Review of Systems  Constitutional: Negative for fever and malaise/fatigue.  HENT: Negative for congestion.   Eyes: Negative for blurred vision.  Respiratory: Negative for shortness of breath.   Cardiovascular: Negative for chest pain, palpitations and leg swelling.  Gastrointestinal: Negative for nausea, abdominal pain and blood in stool.  Genitourinary: Negative for dysuria and frequency.  Musculoskeletal: Negative for falls.  Skin: Positive for itching and rash.  Neurological: Negative for dizziness, loss of consciousness and headaches.  Endo/Heme/Allergies: Negative for environmental allergies.  Psychiatric/Behavioral: Negative for depression. The patient is not nervous/anxious.        Objective:    Physical Exam  Constitutional: He is oriented to person, place, and time. He appears well-developed and well-nourished. No distress.  HENT:  Head: Normocephalic and atraumatic.  Eyes: Conjunctivae are normal.  Neck: Neck supple. No thyromegaly present.  Cardiovascular: Normal rate, regular rhythm and normal heart sounds.   No murmur heard. Pulmonary/Chest: Effort normal and breath sounds normal. No respiratory distress. He has no wheezes.  Abdominal: Soft. Bowel sounds are normal. He exhibits no mass. There is no tenderness.  Musculoskeletal: He exhibits no edema.  Lymphadenopathy:    He has no cervical adenopathy.  Neurological: He is alert and oriented to person, place, and time.  Skin: Skin is warm and dry. Rash noted.  Scattterd. Fluid filled blisters throughtout.   Psychiatric: He has a normal mood and affect. His behavior is  normal.    BP 132/72 mmHg  Pulse 66  Temp(Src) 97.9 F (36.6 C) (Oral)  Ht 6\' 3"  (1.905 m)  Wt 204 lb (92.534 kg)  BMI 25.50 kg/m2  SpO2 97% Wt Readings from Last 3 Encounters:  07/15/15 204 lb (92.534 kg)  02/01/14 192 lb 12.8 oz (87.454 kg)  10/19/13 184 lb 8 oz (83.689 kg)     Lab Results  Component Value Date   WBC 9.2 09/14/2010   HGB 14.9 09/14/2010   HCT 41.9 09/14/2010   PLT 177 09/14/2010   GLUCOSE 103* 02/01/2014   CHOL 160 02/01/2014   TRIG 60.0 02/01/2014   HDL 50.70 02/01/2014   LDLCALC 97 02/01/2014   ALT 32 05/30/2012   ALT 32 05/30/2012   AST 25 05/30/2012   AST 25 05/30/2012   NA 141 02/01/2014   K 4.5 02/01/2014   CL 104 02/01/2014   CREATININE 1.0 02/01/2014   BUN 16 02/01/2014   CO2 30 02/01/2014    No results found for: TSH Lab Results  Component Value Date   WBC 9.2 09/14/2010   HGB 14.9 09/14/2010   HCT 41.9 09/14/2010   MCV 85.9 09/14/2010   PLT 177 09/14/2010   Lab Results  Component Value Date   NA 141 02/01/2014   K 4.5 02/01/2014   CO2 30 02/01/2014   GLUCOSE 103* 02/01/2014   BUN 16 02/01/2014   CREATININE 1.0 02/01/2014   BILITOT 1.0 05/30/2012   BILITOT 1.0 05/30/2012   ALKPHOS 53 05/30/2012   ALKPHOS 53 05/30/2012   AST 25 05/30/2012   AST 25 05/30/2012   ALT 32 05/30/2012   ALT 32 05/30/2012   PROT 7.1 05/30/2012   PROT 7.1 05/30/2012   ALBUMIN 4.4 05/30/2012   ALBUMIN 4.4 05/30/2012   CALCIUM 9.8 02/01/2014   GFR 81.07 02/01/2014   Lab Results  Component Value Date   CHOL 160 02/01/2014   Lab Results  Component Value Date   HDL 50.70 02/01/2014   Lab Results  Component Value Date   LDLCALC 97 02/01/2014   Lab Results  Component Value Date   TRIG 60.0 02/01/2014   Lab Results  Component Value Date   CHOLHDL 3 02/01/2014   No results found for: HGBA1C     Assessment & Plan:   Problem List Items Addressed This Visit    Poison ivy dermatitis    Dawn, witch hazel, steroids. Report if  persists      Essential hypertension - Primary    Well controlled, no changes to meds. Encouraged heart healthy diet such as the DASH diet and exercise as tolerated.          I have discontinued Mr. Ricarte amoxicillin-clavulanate. I am also having him start on triamcinolone cream. Additionally, I am having him maintain his fish oil-omega-3 fatty acids, multivitamin, lisinopril, pravastatin, and methylPREDNISolone.  Meds ordered this encounter  Medications  .  DISCONTD: methylPREDNISolone (MEDROL DOSEPAK) 4 MG TBPK tablet    Sig: Take 6 tabs po once, then 5 tabs po x1 days, then 4 tabs po x1 day, then 3 tabs x1 day, then 2 tabs x1 day, and one tab by one day.    Dispense:  21 tablet    Refill:  1  . methylPREDNISolone (MEDROL DOSEPAK) 4 MG TBPK tablet    Sig: Take 6 tabs po once, then 5 tabs po x1 days, then 4 tabs po x1 day, then 3 tabs x1 day, then 2 tabs x1 day, and one tab by one day.    Dispense:  21 tablet    Refill:  1  . triamcinolone cream (KENALOG) 0.1 %    Sig: Apply 1 application topically 2 (two) times daily.    Dispense:  80 g    Refill:  0     Stephen Homans, MD

## 2015-07-15 NOTE — Progress Notes (Signed)
Pre visit review using our clinic review tool, if applicable. No additional management support is needed unless otherwise documented below in the visit note. 

## 2015-07-29 ENCOUNTER — Encounter: Payer: Self-pay | Admitting: Family Medicine

## 2015-07-29 DIAGNOSIS — L237 Allergic contact dermatitis due to plants, except food: Secondary | ICD-10-CM

## 2015-07-29 HISTORY — DX: Allergic contact dermatitis due to plants, except food: L23.7

## 2015-07-29 NOTE — Assessment & Plan Note (Signed)
Well controlled, no changes to meds. Encouraged heart healthy diet such as the DASH diet and exercise as tolerated.  °

## 2015-07-29 NOTE — Assessment & Plan Note (Signed)
Dawn, witch hazel, steroids. Report if persists

## 2015-08-06 MED FILL — LISINOPRIL 20 MG TABLET: 20 | 90 days supply | Qty: 90 | Fill #2

## 2015-09-04 DIAGNOSIS — H524 Presbyopia: Secondary | ICD-10-CM | POA: Diagnosis not present

## 2015-09-04 DIAGNOSIS — H5213 Myopia, bilateral: Secondary | ICD-10-CM | POA: Diagnosis not present

## 2015-09-04 DIAGNOSIS — H52223 Regular astigmatism, bilateral: Secondary | ICD-10-CM | POA: Diagnosis not present

## 2015-09-04 MED FILL — PRAVASTATIN NA 40 MG TAB: 40 | 90 days supply | Qty: 90 | Fill #2

## 2015-11-06 MED FILL — LISINOPRIL 20 MG TABLET: 20 | 90 days supply | Qty: 90 | Fill #3

## 2015-12-09 MED FILL — PRAVASTATIN NA 40 MG TAB: 40 | 90 days supply | Qty: 90 | Fill #3

## 2016-02-02 ENCOUNTER — Other Ambulatory Visit: Payer: Self-pay | Admitting: Internal Medicine

## 2016-02-02 MED FILL — LISINOPRIL 20 MG TABLET: 20 | 90 days supply | Qty: 90 | Fill #0

## 2016-02-25 DIAGNOSIS — L821 Other seborrheic keratosis: Secondary | ICD-10-CM | POA: Diagnosis not present

## 2016-02-25 DIAGNOSIS — L814 Other melanin hyperpigmentation: Secondary | ICD-10-CM | POA: Diagnosis not present

## 2016-02-25 DIAGNOSIS — D1801 Hemangioma of skin and subcutaneous tissue: Secondary | ICD-10-CM | POA: Diagnosis not present

## 2016-02-25 DIAGNOSIS — Z86018 Personal history of other benign neoplasm: Secondary | ICD-10-CM | POA: Diagnosis not present

## 2016-02-25 DIAGNOSIS — D225 Melanocytic nevi of trunk: Secondary | ICD-10-CM | POA: Diagnosis not present

## 2016-02-25 DIAGNOSIS — Z23 Encounter for immunization: Secondary | ICD-10-CM | POA: Diagnosis not present

## 2016-03-11 ENCOUNTER — Other Ambulatory Visit: Payer: Self-pay | Admitting: Internal Medicine

## 2016-03-11 MED FILL — PRAVASTATIN NA 40 MG TAB: 40 | 90 days supply | Qty: 90 | Fill #0

## 2016-05-03 MED FILL — LISINOPRIL 20 MG TABLET: 20 | 90 days supply | Qty: 90 | Fill #1

## 2016-06-14 MED FILL — PRAVASTATIN NA 40 MG TAB: 40 | 90 days supply | Qty: 90 | Fill #1

## 2016-07-29 MED FILL — LISINOPRIL 20 MG TABLET: 20 | 90 days supply | Qty: 90 | Fill #2

## 2016-09-06 DIAGNOSIS — H524 Presbyopia: Secondary | ICD-10-CM | POA: Diagnosis not present

## 2016-09-06 DIAGNOSIS — H52223 Regular astigmatism, bilateral: Secondary | ICD-10-CM | POA: Diagnosis not present

## 2016-09-06 DIAGNOSIS — H5213 Myopia, bilateral: Secondary | ICD-10-CM | POA: Diagnosis not present

## 2016-10-21 MED FILL — LISINOPRIL 20 MG TABLET: 20 | 90 days supply | Qty: 90 | Fill #3

## 2016-10-21 MED FILL — PRAVASTATIN NA 40 MG TAB: 40 | 90 days supply | Qty: 90 | Fill #2

## 2016-12-20 DIAGNOSIS — H33051 Total retinal detachment, right eye: Secondary | ICD-10-CM | POA: Diagnosis not present

## 2016-12-20 DIAGNOSIS — H524 Presbyopia: Secondary | ICD-10-CM | POA: Diagnosis not present

## 2016-12-20 DIAGNOSIS — H2513 Age-related nuclear cataract, bilateral: Secondary | ICD-10-CM | POA: Diagnosis not present

## 2016-12-20 DIAGNOSIS — H43811 Vitreous degeneration, right eye: Secondary | ICD-10-CM | POA: Diagnosis not present

## 2016-12-20 DIAGNOSIS — H00022 Hordeolum internum right lower eyelid: Secondary | ICD-10-CM | POA: Diagnosis not present

## 2016-12-20 MED FILL — NEO/POLY/DEXAMET EYE OINT: 3.5-10000-0 | 7 days supply | Qty: 4 | Fill #0

## 2016-12-20 MED FILL — DOXYCYCLINE HYCLATE 100 MG: 100 | 7 days supply | Qty: 14 | Fill #0

## 2017-01-03 ENCOUNTER — Other Ambulatory Visit: Payer: Self-pay | Admitting: Internal Medicine

## 2017-01-05 MED FILL — LISINOPRIL 20 MG TABLET: 20 | 30 days supply | Qty: 30 | Fill #0

## 2017-01-18 MED FILL — PRAVASTATIN NA 40 MG TAB: 40 | 90 days supply | Qty: 90 | Fill #3

## 2017-02-01 ENCOUNTER — Other Ambulatory Visit: Payer: Self-pay | Admitting: Internal Medicine

## 2017-02-01 ENCOUNTER — Encounter: Payer: Self-pay | Admitting: Internal Medicine

## 2017-02-02 ENCOUNTER — Other Ambulatory Visit: Payer: Self-pay

## 2017-02-02 MED ORDER — LISINOPRIL 20 MG PO TABS: 20.0000 mg | ORAL_TABLET | Freq: Every day | ORAL | 0 refills | Status: DC

## 2017-02-02 MED FILL — LISINOPRIL 20 MG TABLET: 20 | 30 days supply | Qty: 30 | Fill #0

## 2017-02-15 ENCOUNTER — Encounter: Payer: Self-pay | Admitting: Family Medicine

## 2017-02-15 ENCOUNTER — Ambulatory Visit (INDEPENDENT_AMBULATORY_CARE_PROVIDER_SITE_OTHER): Payer: 59 | Admitting: Family Medicine

## 2017-02-15 VITALS — BP 130/80 | HR 88 | Temp 97.7°F | Ht 75.0 in | Wt 203.5 lb

## 2017-02-15 DIAGNOSIS — E782 Mixed hyperlipidemia: Secondary | ICD-10-CM | POA: Diagnosis not present

## 2017-02-15 DIAGNOSIS — G542 Cervical root disorders, not elsewhere classified: Secondary | ICD-10-CM | POA: Diagnosis not present

## 2017-02-15 DIAGNOSIS — I10 Essential (primary) hypertension: Secondary | ICD-10-CM | POA: Diagnosis not present

## 2017-02-15 DIAGNOSIS — Z0000 Encounter for general adult medical examination without abnormal findings: Secondary | ICD-10-CM | POA: Diagnosis not present

## 2017-02-15 DIAGNOSIS — Z125 Encounter for screening for malignant neoplasm of prostate: Secondary | ICD-10-CM

## 2017-02-15 DIAGNOSIS — H6123 Impacted cerumen, bilateral: Secondary | ICD-10-CM | POA: Insufficient documentation

## 2017-02-15 LAB — URINALYSIS, ROUTINE W REFLEX MICROSCOPIC
Bilirubin Urine: NEGATIVE
HGB URINE DIPSTICK: NEGATIVE
Ketones, ur: NEGATIVE
LEUKOCYTES UA: NEGATIVE
Nitrite: NEGATIVE
RBC / HPF: NONE SEEN (ref 0–?)
Specific Gravity, Urine: 1.02 (ref 1.000–1.030)
TOTAL PROTEIN, URINE-UPE24: NEGATIVE
Urine Glucose: NEGATIVE
Urobilinogen, UA: 0.2 (ref 0.0–1.0)
WBC UA: NONE SEEN (ref 0–?)
pH: 6 (ref 5.0–8.0)

## 2017-02-15 LAB — TSH: TSH: 0.69 u[IU]/mL (ref 0.35–4.50)

## 2017-02-15 LAB — CBC
HEMATOCRIT: 44.4 % (ref 39.0–52.0)
HEMOGLOBIN: 14.5 g/dL (ref 13.0–17.0)
MCHC: 32.7 g/dL (ref 30.0–36.0)
MCV: 95 fl (ref 78.0–100.0)
PLATELETS: 190 10*3/uL (ref 150.0–400.0)
RBC: 4.68 Mil/uL (ref 4.22–5.81)
RDW: 12.9 % (ref 11.5–15.5)
WBC: 5.1 10*3/uL (ref 4.0–10.5)

## 2017-02-15 LAB — COMPREHENSIVE METABOLIC PANEL
ALT: 36 U/L (ref 0–53)
AST: 23 U/L (ref 0–37)
Albumin: 4.7 g/dL (ref 3.5–5.2)
Alkaline Phosphatase: 58 U/L (ref 39–117)
BUN: 22 mg/dL (ref 6–23)
CHLORIDE: 106 meq/L (ref 96–112)
CO2: 31 mEq/L (ref 19–32)
Calcium: 9.6 mg/dL (ref 8.4–10.5)
Creatinine, Ser: 1.01 mg/dL (ref 0.40–1.50)
GFR: 81.08 mL/min (ref >60.00)
GLUCOSE: 101 mg/dL — AB (ref 70–99)
POTASSIUM: 4.3 meq/L (ref 3.5–5.1)
Sodium: 144 mEq/L (ref 135–145)
TOTAL PROTEIN: 6.8 g/dL (ref 6.0–8.3)
Total Bilirubin: 0.8 mg/dL (ref 0.2–1.2)

## 2017-02-15 LAB — LIPID PANEL
CHOLESTEROL: 151 mg/dL (ref 0–200)
HDL: 48.8 mg/dL (ref >39.00)
LDL CALC: 87 mg/dL (ref 0–99)
NonHDL: 101.83
Total CHOL/HDL Ratio: 3
Triglycerides: 75 mg/dL (ref 0.0–149.0)
VLDL: 15 mg/dL (ref 0.0–40.0)

## 2017-02-15 LAB — PSA: PSA: 0.68 ng/mL (ref 0.10–4.00)

## 2017-02-15 MED ORDER — PRAVASTATIN SODIUM 40 MG PO TABS: 40.0000 mg | ORAL_TABLET | Freq: Every day | ORAL | 1 refills | Status: DC

## 2017-02-15 MED ORDER — LISINOPRIL 20 MG PO TABS: 20.0000 mg | ORAL_TABLET | Freq: Every day | ORAL | 1 refills | Status: DC

## 2017-02-15 NOTE — Progress Notes (Signed)
Subjective:  Patient ID: Stephen [name]., male    DOB: 14-Oct-1960  Age: 56 y.o. MRN: [id number]  CC: Establish Care   HPI Stephen [name]. presents for establishment of care and for refills on his medicines.  He has taken pravastatin and lisinopril for quite some time and they work well for him.  He has no problems with these medicines.  He denies cough or myalgias associated with these medicines.  He is married he is married and works as a Art gallery manager for [employer]  He is right-hand dominant.  For some time now he has been having pain in his posterior right shoulder that has been associated with tingling in his fingers.  He feels as though it is getting worse and would like a referral.  He is seen a dermatologist on a regular basis for multiple nevi.  He had a colonoscopy back in 2017.  His father passed at age 43 from a renal cancer but it extended into his colon.  He and his wife have joined a gym and are planning on using it going forward.  History Stephen [name] has a past medical history of Colon polyps, Detached retina, right (1992), High cholesterol, Hypertension, and Poison ivy dermatitis (07/29/2015).   He has a past surgical history that includes Retinal detachment surgery.   His family history includes Cancer in his father; Hyperlipidemia in his father; Hypertension in his brother and father; Parkinsonism in his mother.He reports that  has never smoked. he has never used smokeless tobacco. He reports that he drinks about 0.5 oz of alcohol per week. He reports that he does not use drugs.  Outpatient Medications Prior to Visit  Medication Sig Dispense Refill  . fish oil-omega-3 fatty acids 1000 MG capsule Take 2 g by mouth daily.      . Multiple Vitamin (MULTIVITAMIN) tablet Take 1 tablet by mouth daily.      Marland Kitchen lisinopril (PRINIVIL,ZESTRIL) 20 MG tablet Take 1 tablet (20 mg total) daily by mouth. NEEDS ANNUAL VISIT FOR FURTHER REFILLS 30 tablet 0  . pravastatin (PRAVACHOL) 40 MG  tablet TAKE 1 TABLET BY MOUTH DAILY 90 tablet 3  . methylPREDNISolone (MEDROL DOSEPAK) 4 MG TBPK tablet Take 6 tabs po once, then 5 tabs po x1 days, then 4 tabs po x1 day, then 3 tabs x1 day, then 2 tabs x1 day, and one tab by one day. 21 tablet 1  . triamcinolone cream (KENALOG) 0.1 % Apply 1 application topically 2 (two) times daily. 80 g 0   No facility-administered medications prior to visit.     ROS Review of Systems  Constitutional: Negative for activity change, appetite change, chills and fever.  HENT: Negative.   Eyes: Negative.   Respiratory: Negative.   Cardiovascular: Negative.   Gastrointestinal: Negative.   Endocrine: Negative for polyphagia and polyuria.  Genitourinary: Negative for decreased urine volume and hematuria.  Musculoskeletal: Negative for arthralgias.  Skin: Negative for color change and rash.  Allergic/Immunologic: Negative for immunocompromised state.  Neurological: Positive for numbness. Negative for weakness.  Hematological: Negative.   Psychiatric/Behavioral: Negative.     Objective:  BP 130/80 (BP Location: Left Arm, Patient Position: Sitting, Cuff Size: Normal)   Pulse 88   Temp 97.7 F (36.5 C) (Oral)   Ht 6' 3"  (1.905 m)   Wt 203 lb 8 oz (92.3 kg)   SpO2 99%   BMI 25.44 kg/m   Physical Exam  Constitutional: He is oriented to person, place, and time. He  appears well-developed and well-nourished. No distress.  HENT:  Head: Normocephalic and atraumatic.  Right Ear: Tympanic membrane and external ear normal. A foreign body is present.  Left Ear: Tympanic membrane and external ear normal. A foreign body is present.  Ears:  Eyes: Conjunctivae and EOM are normal. Right eye exhibits no discharge. Left eye exhibits no discharge. No scleral icterus.  Neck: Neck supple. No JVD present. No tracheal deviation present. No thyromegaly present.  Cardiovascular: Normal rate, regular rhythm and normal heart sounds.  Pulmonary/Chest: Effort normal and  breath sounds normal. No stridor. No respiratory distress. He has no wheezes. He has no rales.  Abdominal: Bowel sounds are normal. He exhibits no distension and no mass. There is no tenderness. There is no rebound and no guarding.  Musculoskeletal: He exhibits no edema or tenderness.  Lymphadenopathy:    He has no cervical adenopathy.  Neurological: He is alert and oriented to person, place, and time.  Skin: Skin is warm and dry. He is not diaphoretic.     Psychiatric: He has a normal mood and affect. His behavior is normal.      Assessment & Plan:   Stephen [name] was seen today for establish care.  Diagnoses and all orders for this visit:  Essential hypertension -     CBC -     Comprehensive metabolic panel -     TSH -     Urinalysis, Routine w reflex microscopic -     lisinopril (PRINIVIL,ZESTRIL) 20 MG tablet; Take 1 tablet (20 mg total) by mouth daily. NEEDS ANNUAL VISIT FOR FURTHER REFILLS  Mixed hyperlipidemia -     Comprehensive metabolic panel -     Lipid panel -     pravastatin (PRAVACHOL) 40 MG tablet; Take 1 tablet (40 mg total) by mouth at bedtime.  Routine health maintenance -     PSA -     HIV antibody -     Hepatitis C antibody  Excessive cerumen in both ear canals  Neuropathy, cervical plexus -     Ambulatory referral to Orthopedic Surgery   I have discontinued Stephen [name]. "[name]"'s methylPREDNISolone and triamcinolone cream. I have also changed his lisinopril and pravastatin. Additionally, I am having him maintain his fish oil-omega-3 fatty acids and multivitamin.  Meds ordered this encounter  Medications  . lisinopril (PRINIVIL,ZESTRIL) 20 MG tablet    Sig: Take 1 tablet (20 mg total) by mouth daily. NEEDS ANNUAL VISIT FOR FURTHER REFILLS    Dispense:  90 tablet    Refill:  1  . pravastatin (PRAVACHOL) 40 MG tablet    Sig: Take 1 tablet (40 mg total) by mouth at bedtime.    Dispense:  90 tablet    Refill:  1   Patient advised to purchase an  over-the-counter earwax removal kit.  He was also advised not to use Q-tips.  He was also advised to follow-up for irrigation if he is unable to clean his ear canals himself.  Follow-up: No Follow-up on file.  [name], MD

## 2017-02-16 ENCOUNTER — Encounter: Payer: Self-pay | Admitting: Family Medicine

## 2017-02-16 LAB — HEPATITIS C ANTIBODY
Hepatitis C Ab: NONREACTIVE
SIGNAL TO CUT-OFF: 0.02 (ref <1.00)

## 2017-02-16 LAB — HIV ANTIBODY (ROUTINE TESTING W REFLEX): HIV 1&2 Ab, 4th Generation: NONREACTIVE

## 2017-02-23 DIAGNOSIS — Z23 Encounter for immunization: Secondary | ICD-10-CM | POA: Diagnosis not present

## 2017-02-23 DIAGNOSIS — L821 Other seborrheic keratosis: Secondary | ICD-10-CM | POA: Diagnosis not present

## 2017-02-23 DIAGNOSIS — D225 Melanocytic nevi of trunk: Secondary | ICD-10-CM | POA: Diagnosis not present

## 2017-02-23 DIAGNOSIS — Z86018 Personal history of other benign neoplasm: Secondary | ICD-10-CM | POA: Diagnosis not present

## 2017-02-23 DIAGNOSIS — L814 Other melanin hyperpigmentation: Secondary | ICD-10-CM | POA: Diagnosis not present

## 2017-02-23 DIAGNOSIS — D1801 Hemangioma of skin and subcutaneous tissue: Secondary | ICD-10-CM | POA: Diagnosis not present

## 2017-02-28 ENCOUNTER — Ambulatory Visit: Payer: 59 | Admitting: Family Medicine

## 2017-03-04 ENCOUNTER — Ambulatory Visit: Payer: 59 | Admitting: Family Medicine

## 2017-03-04 ENCOUNTER — Encounter: Payer: Self-pay | Admitting: Family Medicine

## 2017-03-04 ENCOUNTER — Ambulatory Visit (INDEPENDENT_AMBULATORY_CARE_PROVIDER_SITE_OTHER)
Admission: RE | Admit: 2017-03-04 | Discharge: 2017-03-04 | Disposition: A | Discharge status: Home / Self Care | Payer: 59 | Source: Ambulatory Visit | Attending: Family Medicine | Admitting: Family Medicine

## 2017-03-04 VITALS — BP 138/92 | HR 74 | Ht 75.0 in | Wt 202.0 lb

## 2017-03-04 DIAGNOSIS — M5412 Radiculopathy, cervical region: Secondary | ICD-10-CM

## 2017-03-04 DIAGNOSIS — M4802 Spinal stenosis, cervical region: Secondary | ICD-10-CM | POA: Diagnosis not present

## 2017-03-04 MED ORDER — PREDNISONE 50 MG PO TABS: 50.0000 mg | ORAL_TABLET | Freq: Every day | ORAL | 0 refills | Status: DC

## 2017-03-04 MED ORDER — GABAPENTIN 100 MG PO CAPS: 200.0000 mg | ORAL_CAPSULE | Freq: Every day | ORAL | 3 refills | Status: DC

## 2017-03-04 MED FILL — predniSONE 50 MG TABS: 50 | 5 days supply | Qty: 5 | Fill #0

## 2017-03-04 MED FILL — LISINOPRIL 20 MG TABLET: 20 | 90 days supply | Qty: 90 | Fill #0

## 2017-03-04 MED FILL — GABAPENTIN 100 MG CAPSULE: 100 | 30 days supply | Qty: 60 | Fill #0

## 2017-03-04 NOTE — Progress Notes (Signed)
[name] [address], [place] [address] Phone: [phone] Subjective:     CC:  Neuropathy pain   [id number]  Stephen [name]. is a 56 y.o. male coming in with complaint of right upper extremity pain. The pain started a couple months ago. The pain has been in the right shoulder and radiates down into his hands. He does have numbness and tingling in the hand in the middle finger and thumb. No history of neck pain. He has tried ergonomic adjustments with his mouse and keyboard. Does not perform stretching or strengthening exercises. He has used ice and Advil to alleviate his pain.   Onset- chronic, 2 months Location- right arm Duration- intermittent Character- tingling Aggravating factors- working at computer Reliving factors-  Therapies tried- ice, IBU Severity-6 in attendance seems to be worsening   Patient did have a CT scan of the cervical spine back in 2012. Mild to moderate cervical spondylosis at C5-C6.  Past Medical History:  Diagnosis Date  . Colon polyps   . Detached retina, right 1992   Dr. [name] at [facility] did orbital band procedure with good results.   . High cholesterol   . Hypertension   . Poison ivy dermatitis 07/29/2015   Past Surgical History:  Procedure Laterality Date  . RETINAL DETACHMENT SURGERY     Social History   Socioeconomic History  . Marital status: Married    Spouse name: None  . Number of children: 2  . Years of education: 41  . Highest education level: None  Social Needs  . Financial resource strain: None  . Food insecurity - worry: None  . Food insecurity - inability: None  . Transportation needs - medical: None  . Transportation needs - non-medical: None  Occupational History  . Occupation: busines    Employer: [employer]  Tobacco Use  . Smoking status: Never Smoker  . Smokeless tobacco: Never Used  Substance and Sexual Activity  . Alcohol use: Yes    Alcohol/week: 0.5 oz    Types:  1 drink(s) per week  . Drug use: No  . Sexual activity: Yes    Partners: Female  Other Topics Concern  . None  Social History Narrative   HSG, [employer] - BS business admin. Married '84 - 1 son '86, 1 dtr -'47. 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 ACP discussed and he is referred to [email or web address].   No Known Allergies Family History  Problem Relation Age of Onset  . Parkinsonism Mother   . Cancer Father        colon cancer with mets  . Hyperlipidemia Father   . Hypertension Father   . Hypertension Brother   . Diabetes Neg Hx   . Heart disease Neg Hx      Past medical history, social, surgical and family history all reviewed in electronic medical record.  No pertanent information unless stated regarding to the chief complaint.   Review of Systems:Review of systems updated and as accurate as of 03/04/17  No headache, visual changes, nausea, vomiting, diarrhea, constipation, dizziness, abdominal pain, skin rash, fevers, chills, night sweats, weight loss, swollen lymph nodes, body aches, joint swelling, chest pain, shortness of breath, mood changes.  Positive muscle aches  Objective  Blood pressure (!) 138/92, pulse 74, height 6\' 3"  (1.905 m), weight 202 lb (91.6 kg), SpO2 98 %. Systems examined below as of 03/04/17   General: No apparent  distress alert and oriented x3 mood and affect normal, dressed appropriately.  HEENT: Pupils equal, extraocular movements intact  Respiratory: Patient's speak in full sentences and does not appear short of breath  Cardiovascular: No lower extremity edema, non tender, no erythema  Skin: Warm dry intact with no signs of infection or rash on extremities or on axial skeleton.  Abdomen: Soft nontender  Neuro: Cranial nerves II through XII are intact, neurovascularly intact in all extremities with 2+ DTRs and 2+ pulses.  Lymph: No lymphadenopathy of posterior or  anterior cervical chain or axillae bilaterally.  Gait normal with good balance and coordination.  MSK:  Non tender with full range of motion and good stability and symmetric strength and tone of shoulders, elbows, wrist, hip, knee and ankles bilaterally.  Neck: Inspection mild loss of lordosis. No palpable stepoffs. Positive Spurling's maneuver with radicular symptoms in the C7 and C8 distribution. Lacks last 5 degrees of extension as well as the last 10 degrees of side bending to the right and 5 degrees of rotation to the right Strength though shows the patient does have some weakness in the C8 and C7 distribution on the right hand compared to the contralateral side No sensory change to C4 to T1 Negative Hoffman sign bilaterally Reflexes normal Mild hypo-thenar eminence wasting right   Impression and Recommendations:     This case required medical decision making of moderate complexity.      Note: This dictation was prepared with Dragon dictation along with smaller phrase technology. Any transcriptional errors that result from this process are unintentional.

## 2017-03-04 NOTE — Patient Instructions (Signed)
Great to see you  I think it is coming from your neck  Xray downstairs Prednisone daily for 5 days Gabapentin 200mg  at night We will get you a note for an adjustable standing desk  See me again in 1-2 weeks (ok to double book)

## 2017-03-04 NOTE — Assessment & Plan Note (Signed)
Patient is having a positive Spurling's, constant numbness in the middle finger as well as weakness of the C8 distribution.  Concerning for progression patient's previous arthritis that was seen.  With weakness.  Started prednisone and gabapentin.  Likely will follow up in 1 week and at that point hopefully we will start home exercises.  Worsening symptoms or no improvement advanced imaging would be warranted.  Patient knows of any significant severe pain increasing weakness and numbness to seek medical attention immediately

## 2017-03-11 ENCOUNTER — Encounter: Payer: Self-pay | Admitting: Family Medicine

## 2017-03-11 ENCOUNTER — Ambulatory Visit: Payer: 59 | Admitting: Family Medicine

## 2017-03-11 DIAGNOSIS — M5412 Radiculopathy, cervical region: Secondary | ICD-10-CM

## 2017-03-11 MED ORDER — VENLAFAXINE HCL ER 37.5 MG PO CP24: 37.5000 mg | ORAL_CAPSULE | Freq: Every day | ORAL | 1 refills | Status: DC

## 2017-03-11 MED FILL — VENLAFAXINE HCL ER 37.5 MG: 37.5 | 30 days supply | Qty: 30 | Fill #0

## 2017-03-11 NOTE — Patient Instructions (Addendum)
Great to see you  We will try effexor 37.5 mg to help the nerve I think the standing desk will help  Exercises 3 times a week.  See me again in 3-4 weeks

## 2017-03-11 NOTE — Assessment & Plan Note (Signed)
Patient did make some improvement with the prednisone and hopefully will continue.  Declined formal physical therapy but given home exercises and work with Product/process development scientist, we discussed icing regimen, patient knows if worsening weakness or constant numbness occurs that advanced imaging would be warranted.  Patient is in agreement with plan at the moment and will follow up with me again in 2-3 weeks.  Prescribed on Effexor.

## 2017-03-11 NOTE — Progress Notes (Signed)
[name] [address], [place] [address] Phone: [phone] Subjective:     CC: Neck pain follow-up  [id number]  Stephen Kovalcik. is a 56 y.o. male coming in with complaint of neck pain.  Found to have more of a cervical radiculopathy.  Patient did have weakness in the C8 distribution as well.  Patient at last follow-up did get x-rays.  These were independently visualized by me.  Showed moderate disc space narrowing from C5 through C7.  Significant facet hypertrophic areas with exit foraminal narrowing at C5 through C7 bilaterally as well. Patient was given prednisone and was to start on gabapentin.  Patient states improvement in the weakness that he had previously.  Patient does still have some of the discomfort though.  Patient states that the gabapentin unfortunately gave him too much discomfort at night and gave him weird dreams.       Past Medical History:  Diagnosis Date  . Colon polyps   . Detached retina, right 1992   [facility] [name] did orbital band procedure with good results.   . High cholesterol   . Hypertension   . Poison ivy dermatitis 07/29/2015   Past Surgical History:  Procedure Laterality Date  . RETINAL DETACHMENT SURGERY     Social History   Socioeconomic History  . Marital status: Married    Spouse name: None  . Number of children: 2  . Years of education: 43  . Highest education level: None  Social Needs  . Financial resource strain: None  . Food insecurity - worry: None  . Food insecurity - inability: None  . Transportation needs - medical: None  . Transportation needs - non-medical: None  Occupational History  . Occupation: busines    Employer: [employer]  Tobacco Use  . Smoking status: Never Smoker  . Smokeless tobacco: Never Used  Substance and Sexual Activity  . Alcohol use: Yes    Alcohol/week: 0.5 oz    Types: 1 drink(s) per week  . Drug use: No  . Sexual activity: Yes    Partners:  Female  Other Topics Concern  . None  Social History Narrative   HSG, [employer] - BS business admin. Married '84 - 1 son '86, 1 dtr -'52. 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 ACP discussed and he is referred to [email or web address].   No Known Allergies Family History  Problem Relation Age of Onset  . Parkinsonism Mother   . Cancer Father        colon cancer with mets  . Hyperlipidemia Father   . Hypertension Father   . Hypertension Brother   . Diabetes Neg Hx   . Heart disease Neg Hx      Past medical history, social, surgical and family history all reviewed in electronic medical record.  No pertanent information unless stated regarding to the chief complaint.   Review of Systems:Review of systems updated and as accurate as of 03/11/17  No headache, visual changes, nausea, vomiting, diarrhea, constipation, dizziness, abdominal pain, skin rash, fevers, chills, night sweats, weight loss, swollen lymph nodes, body aches, joint swelling, muscle aches, chest pain, shortness of breath, mood changes.   Objective  Blood pressure 140/84, pulse 72, height 6\' 3"  (1.905 m), weight 209 lb (94.8 kg), SpO2 97 %. Systems examined below as of 03/11/17   General: No apparent distress alert and oriented x3 mood and affect normal, dressed appropriately.  HEENT: Pupils equal, extraocular movements intact  Respiratory: Patient's speak in full sentences and does not appear short of breath  Cardiovascular: No lower extremity edema, non tender, no erythema  Skin: Warm dry intact with no signs of infection or rash on extremities or on axial skeleton.  Abdomen: Soft nontender  Neuro: Cranial nerves II through XII are intact, neurovascularly intact in all extremities with 2+ DTRs and 2+ pulses.  Lymph: No lymphadenopathy of posterior or anterior cervical chain or axillae bilaterally.  Gait normal with good balance and  coordination.  MSK:  Non tender with full range of motion and good stability and symmetric strength and tone of shoulders, elbows, wrist, hip, knee and ankles bilaterally.  Neck: Inspection loss of lordosis. No palpable stepoffs. Positive Spurling's maneuver for radicular symptoms on the right. Mild decrease in range of motion especially with extension and right-sided rotation and side bending Improvement in strength with 4+ out of 5 in the C8 distribution on the right side which is an improvement from previous exam. Negative Hoffman sign bilaterally Reflexes normal  [id number]; 15 additional minutes spent for Therapeutic exercises as stated in above notes.  This included exercises focusing on stretching, strengthening, with significant focus on eccentric aspects.   Long term goals include an improvement in range of motion, strength, endurance as well as avoiding reinjury. Patient's frequency would include in 1-2 times a day, 3-5 times a week for a duration of 6-12 weeks.  Exercises that included:  Basic scapular stabilization to include adduction and depression of scapula Scaption, focusing on proper movement and good control Internal and External rotation utilizing a theraband, with elbow tucked at side entire time Rows with theraband which was given  Proper technique shown and discussed handout in great detail with ATC.  All questions were discussed and answered.      Impression and Recommendations:     This case required medical decision making of moderate complexity.      Note: This dictation was prepared with Dragon dictation along with smaller phrase technology. Any transcriptional errors that result from this process are unintentional.

## 2017-04-05 NOTE — Progress Notes (Signed)
[name] [address], [place] [address] Phone: [phone] Subjective:     CC: Neck pain and right arm pain  [id number]  Stephen [name]. is a 57 y.o. male coming in with complaint of right-sided neck pain. He notes that some days are better than others. He has been doing the exercises that were prescribed to him. He is also using an adjustable desk. He is still experiencing tingling into his right hand.  Patient states that the frequency duration and severity of her radicular symptoms is decreasing.  Did start the Effexor at 37.5 mg but has not noticed any benefit but also has not noticed any worsening symptoms.      Past Medical History:  Diagnosis Date  . Colon polyps   . Detached retina, right 1992   Dr. [name] at [facility] did orbital band procedure with good results.   . High cholesterol   . Hypertension   . Poison ivy dermatitis 07/29/2015   Past Surgical History:  Procedure Laterality Date  . RETINAL DETACHMENT SURGERY     Social History   Socioeconomic History  . Marital status: Married    Spouse name: None  . Number of children: 2  . Years of education: 68  . Highest education level: None  Social Needs  . Financial resource strain: None  . Food insecurity - worry: None  . Food insecurity - inability: None  . Transportation needs - medical: None  . Transportation needs - non-medical: None  Occupational History  . Occupation: busines    Employer: [employer]  Tobacco Use  . Smoking status: Never Smoker  . Smokeless tobacco: Never Used  Substance and Sexual Activity  . Alcohol use: Yes    Alcohol/week: 0.5 oz    Types: 1 drink(s) per week  . Drug use: No  . Sexual activity: Yes    Partners: Female  Other Topics Concern  . None  Social History Narrative   HSG, [employer] - BS business admin. Married '84 - 1 son '86, 1 dtr -'73. 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  Marriage is in good health. ACP discussed and he is referred to [email or web address].   No Known Allergies Family History  Problem Relation Age of Onset  . Parkinsonism Mother   . Cancer Father        colon cancer with mets  . Hyperlipidemia Father   . Hypertension Father   . Hypertension Brother   . Diabetes Neg Hx   . Heart disease Neg Hx      Past medical history, social, surgical and family history all reviewed in electronic medical record.  No pertanent information unless stated regarding to the chief complaint.   Review of Systems:Review of systems updated and as accurate as of 04/06/17  No headache, visual changes, nausea, vomiting, diarrhea, constipation, dizziness, abdominal pain, skin rash, fevers, chills, night sweats, weight loss, swollen lymph nodes, body aches, joint swelling, muscle aches, chest pain, shortness of breath, mood changes.   Objective  Blood pressure 118/82, pulse 68, weight 204 lb (92.5 kg), SpO2 98 %. Systems examined below as of 04/06/17   General: No apparent distress alert and oriented x3 mood and affect normal, dressed appropriately.  HEENT: Pupils equal, extraocular movements intact  Respiratory: Patient's speak in full sentences and does not appear short of breath  Cardiovascular: No lower extremity edema, non tender, no erythema  Skin: Warm dry intact with no signs of  infection or rash on extremities or on axial skeleton.  Abdomen: Soft nontender  Neuro: Cranial nerves II through XII are intact, neurovascularly intact in all extremities with 2+ DTRs and 2+ pulses.  Lymph: No lymphadenopathy of posterior or anterior cervical chain or axillae bilaterally.  Gait normal with good balance and coordination.  MSK:  Non tender with full range of motion and good stability and symmetric strength and tone of shoulders, elbows, wrist, hip, knee and ankles bilaterally.   Neck: Inspection mild loss of lordosis. No palpable stepoffs. Mild positive  Spurling's maneuver. Decreased extension of 5 degrees and minimal side bending to the right. Grip strength and sensation normal in bilateral hands Strength good C4 to T1 distribution No sensory change to C4 to T1 Negative Hoffman sign bilaterally Reflexes normal  Osteopathic findings C2 flexed rotated and side bent right C4 flexed rotated and side bent left C7 flexed rotated and side bent right  T3 extended rotated and side bent right  L2 flexed rotated and side bent right Sacrum right on right     Impression and Recommendations:     This case required medical decision making of moderate complexity.      Note: This dictation was prepared with Dragon dictation along with smaller phrase technology. Any transcriptional errors that result from this process are unintentional.

## 2017-04-06 ENCOUNTER — Ambulatory Visit: Payer: 59 | Admitting: Family Medicine

## 2017-04-06 ENCOUNTER — Encounter: Payer: Self-pay | Admitting: Family Medicine

## 2017-04-06 DIAGNOSIS — M999 Biomechanical lesion, unspecified: Secondary | ICD-10-CM | POA: Diagnosis not present

## 2017-04-06 DIAGNOSIS — M5412 Radiculopathy, cervical region: Secondary | ICD-10-CM

## 2017-04-06 MED ORDER — VENLAFAXINE HCL ER 75 MG PO CP24
75.0000 mg | ORAL_CAPSULE | Freq: Every day | ORAL | 3 refills | Status: DC
Start: 2017-04-06 — End: 2017-05-04

## 2017-04-06 MED FILL — VENLAFAXINE HCL ER 75 MG CA: 75 | 30 days supply | Qty: 30 | Fill #0

## 2017-04-06 NOTE — Assessment & Plan Note (Signed)
Decision today to treat with OMT was based on Physical Exam  After verbal consent patient was treated with HVLA, ME, FPR techniques in cervical, thoracic, lumbar and sacral areas  Patient tolerated the procedure well with improvement in symptoms  Patient given exercises, stretches and lifestyle modifications  See medications in patient instructions if given  Patient will follow up in 4 weeks 

## 2017-04-06 NOTE — Patient Instructions (Signed)
Good to see you  We are making progress Started manipulation again and hope it helps Effexor 75mg  daily and lets see if it helps Continue the exercises See me again in 4 weeks

## 2017-04-06 NOTE — Assessment & Plan Note (Signed)
Patient strength is significantly improved at this time.  We will increase Effexor to 75 mg.  Attempted osteopathic manipulation in the hope will be beneficial.  Encourage patient to continue to work on posture and ergonomics throughout work.  Patient will make these changes and follow-up with me again in 4 weeks.

## 2017-04-20 MED FILL — PRAVASTATIN NA 40 MG TAB: 40 | 90 days supply | Qty: 90 | Fill #0

## 2017-05-03 NOTE — Progress Notes (Signed)
[name] [place], [place] [address] Phone: [phone] Subjective:     CC: Neck pain follow-up  PNT:[id number]  Stephen [name]. is a 57 y.o. male coming in with complaint of neck pain.  Patient found to have right-sided neck pain with radicular symptoms.  Patient was started on Effexor at a low dose.  Attempted osteopathic manipulation.  Increased Effexor to 75 mg.  Patient was to do home exercises.  Patient states overall he seems to be making improvement.  States that as long as he does more of a standing desk he seems to be doing better as well.  This put some more tenderness of his low back.  Patient denies though any radiation.  Feels with the increased dose of the Effexor he is having less pain and numbness.  Happy with the results of far.     Past Medical History:  Diagnosis Date  . Colon polyps   . Detached retina, right 1992   Dr. [name] at [facility] did orbital band procedure with good results.   . High cholesterol   . Hypertension   . Poison ivy dermatitis 07/29/2015   Past Surgical History:  Procedure Laterality Date  . RETINAL DETACHMENT SURGERY     Social History   Socioeconomic History  . Marital status: Married    Spouse name: Not on file  . Number of children: 2  . Years of education: 80  . Highest education level: Not on file  Social Needs  . Financial resource strain: Not on file  . Food insecurity - worry: Not on file  . Food insecurity - inability: Not on file  . Transportation needs - medical: Not on file  . Transportation needs - non-medical: Not on file  Occupational History  . Occupation: busines    Employer: [employer]  Tobacco Use  . Smoking status: Never Smoker  . Smokeless tobacco: Never Used  Substance and Sexual Activity  . Alcohol use: Yes    Alcohol/week: 0.5 oz    Types: 1 drink(s) per week  . Drug use: No  . Sexual activity: Yes    Partners: Female  Other Topics Concern  . Not on file    Social History Narrative   HSG, [employer] - BS business admin. Married '84 - 1 son '86, 1 dtr -'28. 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 ACP discussed and he is referred to [email or web address].   No Known Allergies Family History  Problem Relation Age of Onset  . Parkinsonism Mother   . Cancer Father        colon cancer with mets  . Hyperlipidemia Father   . Hypertension Father   . Hypertension Brother   . Diabetes Neg Hx   . Heart disease Neg Hx      Past medical history, social, surgical and family history all reviewed in electronic medical record.  No pertanent information unless stated regarding to the chief complaint.   Review of Systems:Review of systems updated and as accurate as of 05/03/17  No headache, visual changes, nausea, vomiting, diarrhea, constipation, dizziness, abdominal pain, skin rash, fevers, chills, night sweats, weight loss, swollen lymph nodes, body aches, joint swelling, muscle aches, chest pain, shortness of breath, mood changes.   Objective  There were no vitals taken for this visit. Systems examined below as of 05/03/17   General: No apparent distress alert and oriented x3 mood and affect normal,  dressed appropriately.  HEENT: Pupils equal, extraocular movements intact  Respiratory: Patient's speak in full sentences and does not appear short of breath  Cardiovascular: No lower extremity edema, non tender, no erythema  Skin: Warm dry intact with no signs of infection or rash on extremities or on axial skeleton.  Abdomen: Soft nontender  Neuro: Cranial nerves II through XII are intact, neurovascularly intact in all extremities with 2+ DTRs and 2+ pulses.  Lymph: No lymphadenopathy of posterior or anterior cervical chain or axillae bilaterally.  Gait normal with good balance and coordination.  MSK:  Non tender with full range of motion and good stability and symmetric strength  and tone of shoulders, elbows, wrist, hip, knee and ankles bilaterally.  Neck: Inspection unremarkable. No palpable stepoffs. Negative Spurling's maneuver. Lacks 5-10 degrees of extension and sidebending bilaterally. Grip strength and sensation normal in bilateral hands Strength good C4 to T1 distribution No sensory change to C4 to T1 Negative Hoffman sign bilaterally Reflexes normal Tightness in the right trapezius  Osteopathic findings C2 flexed rotated and side bent right C6 flexed rotated and side bent left T3 extended rotated and side bent right inhaled third rib L3 flexed rotated and side bent right Sacrum right on right    Impression and Recommendations:     This case required medical decision making of moderate complexity.      Note: This dictation was prepared with Dragon dictation along with smaller phrase technology. Any transcriptional errors that result from this process are unintentional.

## 2017-05-04 ENCOUNTER — Ambulatory Visit (INDEPENDENT_AMBULATORY_CARE_PROVIDER_SITE_OTHER): Payer: 59 | Admitting: Family Medicine

## 2017-05-04 ENCOUNTER — Encounter: Payer: Self-pay | Admitting: Family Medicine

## 2017-05-04 VITALS — BP 156/84 | HR 78 | Ht 75.0 in | Wt 205.0 lb

## 2017-05-04 DIAGNOSIS — M5412 Radiculopathy, cervical region: Secondary | ICD-10-CM | POA: Diagnosis not present

## 2017-05-04 DIAGNOSIS — M999 Biomechanical lesion, unspecified: Secondary | ICD-10-CM | POA: Diagnosis not present

## 2017-05-04 MED ORDER — VENLAFAXINE HCL ER 75 MG PO CP24: 75.0000 mg | ORAL_CAPSULE | Freq: Every day | ORAL | 3 refills | Status: DC

## 2017-05-04 MED FILL — VENLAFAXINE HCL ER 75 MG CA: 75 | 90 days supply | Qty: 90 | Fill #0

## 2017-05-04 NOTE — Patient Instructions (Signed)
Good to see you  Overall you are doing great  Refilled the effexor Posture will be key  Ice can help  See me again in 6-8 weeks

## 2017-05-04 NOTE — Assessment & Plan Note (Signed)
Patient has done relatively well.  Continue the Effexor at 75 mg.  We discussed icing regimen and home exercises.  Discussed the posture again.  Space out intervals to 6-8 weeks.

## 2017-05-04 NOTE — Assessment & Plan Note (Signed)
Decision today to treat with OMT was based on Physical Exam  After verbal consent patient was treated with HVLA, ME, FPR techniques in cervical, thoracic, rib, lumbar and sacral areas  Patient tolerated the procedure well with improvement in symptoms  Patient given exercises, stretches and lifestyle modifications  See medications in patient instructions if given  Patient will follow up in 6-8 weeks 

## 2017-05-30 MED FILL — LISINOPRIL 20 MG TABLET: 20 | 90 days supply | Qty: 90 | Fill #1

## 2017-06-21 NOTE — Progress Notes (Signed)
Stephen [name], Stephen [name] Phone: [phone] Subjective:    I'm seeing this patient by the request  of:    CC: Neck pain follow-up  TFT:[id number]  Sirron Francesconi. is a 57 y.o. male coming in with complaint of neck pain.  Initially was found to have more of a cervical radiculopathy with C8 weakness.  He also had C7 numbness.  Patient did well with the prednisone and did have difficulty with gabapentin.  We started patient on Effexor and has noticed significant improvement overall.  Seems to be doing relatively well.  Has not made significant change with the adjustable standing desk  Patient does have x-rays of the neck that showed moderate osteoarthritic changes that were independently visualized by me.    Past Medical History:  Diagnosis Date  . Colon polyps   . Detached retina, right 1992   Dr. [name] at [facility] did orbital band procedure with good results.   . High cholesterol   . Hypertension   . Poison ivy dermatitis 07/29/2015   Past Surgical History:  Procedure Laterality Date  . RETINAL DETACHMENT SURGERY     Social History   Socioeconomic History  . Marital status: Married    Spouse name: Not on file  . Number of children: 2  . Years of education: 41  . Highest education level: Not on file  Occupational History  . Occupation: busines    Employer: [employer]  Social Needs  . Financial resource strain: Not on file  . Food insecurity:    Worry: Not on file    Inability: Not on file  . Transportation needs:    Medical: Not on file    Non-medical: Not on file  Tobacco Use  . Smoking status: Never Smoker  . Smokeless tobacco: Never Used  Substance and Sexual Activity  . Alcohol use: Yes    Alcohol/week: 0.5 oz    Types: 1 drink(s) per week  . Drug use: No  . Sexual activity: Yes    Partners: Female  Lifestyle  . Physical activity:    Days per week: Not on file    Minutes per session: Not on file  .  Stress: Not on file  Relationships  . Social connections:    Talks on phone: Not on file    Gets together: Not on file    Attends religious service: Not on file    Active member of club or organization: Not on file    Attends meetings of clubs or organizations: Not on file    Relationship status: Not on file  Other Topics Concern  . Not on file  Social History Narrative   HSG, [employer] - BS business admin. Married '84 - 1 son '86, 1 dtr -'39. 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 ACP discussed and he is referred to [email or web address].   No Known Allergies Family History  Problem Relation Age of Onset  . Parkinsonism Mother   . Cancer Father        colon cancer with mets  . Hyperlipidemia Father   . Hypertension Father   . Hypertension Brother   . Diabetes Neg Hx   . Heart disease Neg Hx      Past medical history, social, surgical and family history all reviewed in electronic medical record.  No pertanent information unless stated regarding to the chief complaint.   Review of Systems:Review  of systems updated and as accurate as of 06/22/17  No headache, visual changes, nausea, vomiting, diarrhea, constipation, dizziness, abdominal pain, skin rash, fevers, chills, night sweats, weight loss, swollen lymph nodes, body aches, joint swelling, muscle aches, chest pain, shortness of breath, mood changes.   Objective  Blood pressure (!) 150/78, pulse 71, height 6\' 3"  (1.905 m), weight 210 lb (95.3 kg), SpO2 97 %. Systems examined below as of 06/22/17   General: No apparent distress alert and oriented x3 mood and affect normal, dressed appropriately.  HEENT: Pupils equal, extraocular movements intact  Respiratory: Patient's speak in full sentences and does not appear short of breath  Cardiovascular: No lower extremity edema, non tender, no erythema  Skin: Warm dry intact with no signs of infection or rash  on extremities or on axial skeleton.  Abdomen: Soft nontender  Neuro: Cranial nerves II through XII are intact, neurovascularly intact in all extremities with 2+ DTRs and 2+ pulses.  Lymph: No lymphadenopathy of posterior or anterior cervical chain or axillae bilaterally.  Gait normal with good balance and coordination.  MSK:  Non tender with full range of motion and good stability and symmetric strength and tone of shoulders, elbows, wrist, hip, knee and ankles bilaterally.  Neck: Inspection mild loss of lordosis. No palpable stepoffs. Negative Spurling's maneuver. Still mild limitation in range of motion lacking last 5 degrees of rotation and side bending bilaterally Grip strength and sensation normal in bilateral hands Strength good C4 to T1 distribution No sensory change to C4 to T1 Negative Hoffman sign bilaterally Reflexes normal Mild tightness in the right trapezius  Osteopathic findings C2 flexed rotated and side bent right C4 flexed rotated and side bent left C6 flexed rotated and side bent right  T4 extended rotated and side bent right inhaled rib L3 flexed rotated and side bent left     Impression and Recommendations:     This case required medical decision making of moderate complexity.      Note: This dictation was prepared with Dragon dictation along with smaller phrase technology. Any transcriptional errors that result from this process are unintentional.

## 2017-06-22 ENCOUNTER — Ambulatory Visit (INDEPENDENT_AMBULATORY_CARE_PROVIDER_SITE_OTHER): Payer: 59 | Admitting: Family Medicine

## 2017-06-22 ENCOUNTER — Encounter: Payer: Self-pay | Admitting: Family Medicine

## 2017-06-22 VITALS — BP 150/78 | HR 71 | Ht 75.0 in | Wt 210.0 lb

## 2017-06-22 DIAGNOSIS — M5412 Radiculopathy, cervical region: Secondary | ICD-10-CM | POA: Diagnosis not present

## 2017-06-22 DIAGNOSIS — M999 Biomechanical lesion, unspecified: Secondary | ICD-10-CM

## 2017-06-22 NOTE — Assessment & Plan Note (Signed)
Improvement at this time.  Doing well with the Effexor.  No significant changes in management.  Follow-up with me again in 6-8 weeks

## 2017-06-22 NOTE — Assessment & Plan Note (Signed)
Decision today to treat with OMT was based on Physical Exam  After verbal consent patient was treated with HVLA, ME, FPR techniques in cervical, thoracic, lumbar and sacral areas  Patient tolerated the procedure well with improvement in symptoms  Patient given exercises, stretches and lifestyle modifications  See medications in patient instructions if given  Patient will follow up in 6-8 weeks 

## 2017-06-22 NOTE — Patient Instructions (Signed)
Good to see you  Stephen [name] is your friend.  I am impressed Continue all meds See me again in 6-8 weeks

## 2017-07-21 MED FILL — PRAVASTATIN NA 40 MG TAB: 40 | 90 days supply | Qty: 90 | Fill #1

## 2017-08-04 ENCOUNTER — Encounter: Payer: Self-pay | Admitting: Family Medicine

## 2017-08-04 ENCOUNTER — Ambulatory Visit: Payer: 59 | Admitting: Family Medicine

## 2017-08-04 VITALS — BP 136/80 | HR 74 | Temp 97.7°F | Ht 75.0 in | Wt 204.1 lb

## 2017-08-04 DIAGNOSIS — J040 Acute laryngitis: Secondary | ICD-10-CM | POA: Diagnosis not present

## 2017-08-04 DIAGNOSIS — J029 Acute pharyngitis, unspecified: Secondary | ICD-10-CM | POA: Diagnosis not present

## 2017-08-04 LAB — POCT RAPID STREP A (OFFICE): RAPID STREP A SCREEN: NEGATIVE

## 2017-08-04 MED ORDER — AZITHROMYCIN 250 MG PO TABS: ORAL_TABLET | ORAL | 0 refills | Status: DC

## 2017-08-04 MED FILL — AZITHROMYCIN 250 MG TABLET: 250 | 5 days supply | Qty: 6 | Fill #0

## 2017-08-04 MED FILL — VENLAFAXINE HCL ER 75 MG CA: 75 | 90 days supply | Qty: 90 | Fill #1

## 2017-08-04 NOTE — Progress Notes (Signed)
Subjective:  Patient ID: Stephen [name]., male    DOB: 09-05-1960  Age: 57 y.o. MRN: [id number]  CC: Sore Throat   HPI Seabron Iannello. presents for evaluation of a one-week history of sore throat associated postnasal drip and nasal congestion.  He denies headache fever nausea or vomiting.  There is been no facial pressure teeth pain.  He awoke this morning with loss of voice.  He is dealing with seasonal allergy issues with zyrtec and nasal steroid. effexor helping chronic neck pain!  Outpatient Medications Prior to Visit  Medication Sig Dispense Refill  . fish oil-omega-3 fatty acids 1000 MG capsule Take 2 g by mouth daily.      Marland Kitchen lisinopril (PRINIVIL,ZESTRIL) 20 MG tablet Take 1 tablet (20 mg total) by mouth daily. NEEDS ANNUAL VISIT FOR FURTHER REFILLS 90 tablet 1  . Multiple Vitamin (MULTIVITAMIN) tablet Take 1 tablet by mouth daily.      . pravastatin (PRAVACHOL) 40 MG tablet Take 1 tablet (40 mg total) by mouth at bedtime. 90 tablet 1  . venlafaxine XR (EFFEXOR XR) 75 MG 24 hr capsule Take 1 capsule (75 mg total) by mouth daily with breakfast. 90 capsule 3   No facility-administered medications prior to visit.     ROS Review of Systems  Constitutional: Negative for chills, fatigue, fever and unexpected weight change.  HENT: Positive for congestion, postnasal drip, sore throat and voice change. Negative for sinus pressure, sinus pain and trouble swallowing.   Eyes: Negative.   Respiratory: Negative for cough, chest tightness and wheezing.   Cardiovascular: Negative.   Gastrointestinal: Negative for nausea and vomiting.  Musculoskeletal: Negative for arthralgias and myalgias.  Skin: Negative for pallor and rash.  Allergic/Immunologic: Negative for immunocompromised state.  Neurological: Negative for headaches.  Hematological: Negative.   Psychiatric/Behavioral: Negative.     Objective:  BP 136/80   Pulse 74   Temp 97.7 F (36.5 C)   Ht 6\' 3"  (1.905 m)   Wt 204 lb 2  oz (92.6 kg)   SpO2 99%   BMI 25.51 kg/m   BP Readings from Last 3 Encounters:  08/04/17 136/80  06/22/17 (!) 150/78  05/04/17 (!) 156/84    Wt Readings from Last 3 Encounters:  08/04/17 204 lb 2 oz (92.6 kg)  06/22/17 210 lb (95.3 kg)  05/04/17 205 lb (93 kg)    Physical Exam  Constitutional: He is oriented to person, place, and time. He appears well-developed and well-nourished.  Non-toxic appearance. He does not appear ill.  HENT:  Head: Normocephalic and atraumatic.  Right Ear: Hearing, tympanic membrane and ear canal normal. No drainage, swelling or tenderness.  Left Ear: Hearing, tympanic membrane and ear canal normal. No drainage, swelling or tenderness.  Mouth/Throat: Uvula is midline and mucous membranes are normal. No oral lesions. No uvula swelling. Posterior oropharyngeal edema and posterior oropharyngeal erythema present. No oropharyngeal exudate or tonsillar abscesses. No tonsillar exudate.  Eyes: Pupils are equal, round, and reactive to light. EOM are normal.  Neck: No thyromegaly present.  Cardiovascular: Normal rate, regular rhythm, normal heart sounds and intact distal pulses.  Pulmonary/Chest: Effort normal and breath sounds normal. No respiratory distress. He has no wheezes. He has no rhonchi. He has no rales.  Abdominal: Bowel sounds are normal.  Lymphadenopathy:    He has no cervical adenopathy.  Neurological: He is alert and oriented to person, place, and time.  Skin: Skin is warm and dry.  Psychiatric: He has a normal mood and  affect. His behavior is normal.    Lab Results  Component Value Date   WBC 5.1 02/15/2017   HGB 14.5 02/15/2017   HCT 44.4 02/15/2017   PLT 190.0 02/15/2017   GLUCOSE 101 (H) 02/15/2017   CHOL 151 02/15/2017   TRIG 75.0 02/15/2017   HDL 48.80 02/15/2017   LDLCALC 87 02/15/2017   ALT 36 02/15/2017   AST 23 02/15/2017   NA 144 02/15/2017   K 4.3 02/15/2017   CL 106 02/15/2017   CREATININE 1.01 02/15/2017   BUN 22  02/15/2017   CO2 31 02/15/2017   TSH 0.69 02/15/2017   PSA 0.68 02/15/2017    Dg Cervical Spine Complete  Result Date: 03/04/2017 CLINICAL DATA:  Cervicalgia with radicular symptoms EXAM: CERVICAL SPINE - COMPLETE 4+ VIEW COMPARISON:  Cervical spine CT September 14, 2010 FINDINGS: Frontal, lateral, open-mouth odontoid, and bilateral oblique views were obtained. There is no fracture or spondylolisthesis. Prevertebral soft tissues and predental space regions are normal. There is moderate disc space narrowing at C5-6 and C6-7. Other disc spaces appear normal. There is facet hypertrophy with exit foraminal narrowing at C5-6 and C6-7 bilaterally. Lung apices are clear. There is mild calcification in the left carotid artery. IMPRESSION: Osteoarthritic change at C5-6 and C6-7. No fracture or spondylolisthesis. Mild calcification in left carotid artery. Electronically Signed   By: Lowella Grip III M.D.   On: 03/04/2017 09:50    Assessment & Plan:   Little was seen today for sore throat.  Diagnoses and all orders for this visit:  Laryngitis, acute -     POC Rapid Strep A  Pharyngitis, unspecified etiology -     azithromycin (ZITHROMAX) 250 MG tablet; Take 2 today and then one each day until finished.   I am having Stephen [name]. "[name]" start on azithromycin. I am also having him maintain his fish oil-omega-3 fatty acids, multivitamin, lisinopril, pravastatin, and venlafaxine XR.  Meds ordered this encounter  Medications  . azithromycin (ZITHROMAX) 250 MG tablet    Sig: Take 2 today and then one each day until finished.    Dispense:  6 tablet    Refill:  0     Follow-up: No follow-ups on file.  [name], MD

## 2017-08-07 NOTE — Progress Notes (Signed)
[name] [address] [place], [place] [address] Phone: [phone] Subjective:      CC: Neck pain follow-up  FBP:[id number]  Stephen [name]. is a 57 y.o. male coming in with complaint of neck pain.  Known to have moderate osteoarthritic changes.  Patient has been given Effexor and has been doing well with this as well as home exercises and ergonomics.  Patient states that he has been doing well since last visit.  Minimal discomfort down the hand.  States that if he sits more at work he notices some more discomfort.  Standing at work has been more helpful.    Past Medical History:  Diagnosis Date  . Colon polyps   . Detached retina, right 1992   Dr. [name] at [facility] did orbital band procedure with good results.   . High cholesterol   . Hypertension   . Poison ivy dermatitis 07/29/2015   Past Surgical History:  Procedure Laterality Date  . RETINAL DETACHMENT SURGERY     Social History   Socioeconomic History  . Marital status: Married    Spouse name: Not on file  . Number of children: 2  . Years of education: 42  . Highest education level: Not on file  Occupational History  . Occupation: busines    Employer: [employer]  Social Needs  . Financial resource strain: Not on file  . Food insecurity:    Worry: Not on file    Inability: Not on file  . Transportation needs:    Medical: Not on file    Non-medical: Not on file  Tobacco Use  . Smoking status: Never Smoker  . Smokeless tobacco: Never Used  Substance and Sexual Activity  . Alcohol use: Yes    Alcohol/week: 0.5 oz    Types: 1 drink(s) per week  . Drug use: No  . Sexual activity: Yes    Partners: Female  Lifestyle  . Physical activity:    Days per week: Not on file    Minutes per session: Not on file  . Stress: Not on file  Relationships  . Social connections:    Talks on phone: Not on file    Gets together: Not on file    Attends religious service: Not on file    Active  member of club or organization: Not on file    Attends meetings of clubs or organizations: Not on file    Relationship status: Not on file  Other Topics Concern  . Not on file  Social History Narrative   HSG, [employer] - BS business admin. Married '84 - 1 son '86, 1 dtr -'33. 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 ACP discussed and he is referred to [email or web address].   No Known Allergies Family History  Problem Relation Age of Onset  . Parkinsonism Mother   . Cancer Father        colon cancer with mets  . Hyperlipidemia Father   . Hypertension Father   . Hypertension Brother   . Diabetes Neg Hx   . Heart disease Neg Hx      Past medical history, social, surgical and family history all reviewed in electronic medical record.  No pertanent information unless stated regarding to the chief complaint.   Review of Systems:Review of systems updated and as accurate as of 08/08/17  No headache, visual changes, nausea, vomiting, diarrhea, constipation, dizziness, abdominal pain, skin rash, fevers, chills,  night sweats, weight loss, swollen lymph nodes, body aches, joint swelling, , chest pain, shortness of breath, mood changes.  Positive muscle aches  Objective  Blood pressure 120/74, pulse 79, height 6\' 3"  (1.905 m), weight 208 lb (94.3 kg), SpO2 (!) 89 %. Systems examined below as of 08/08/17   General: No apparent distress alert and oriented x3 mood and affect normal, dressed appropriately.  HEENT: Pupils equal, extraocular movements intact  Respiratory: Patient's speak in full sentences and does not appear short of breath  Cardiovascular: No lower extremity edema, non tender, no erythema  Skin: Warm dry intact with no signs of infection or rash on extremities or on axial skeleton.  Abdomen: Soft nontender  Neuro: Cranial nerves II through XII are intact, neurovascularly intact in all extremities with 2+ DTRs  and 2+ pulses.  Lymph: No lymphadenopathy of posterior or anterior cervical chain or axillae bilaterally.  Gait normal with good balance and coordination.  MSK:  Non tender with full range of motion and good stability and symmetric strength and tone of shoulders, elbows, wrist, hip, knee and ankles bilaterally.  Neck: Inspection mild loss of lordosis. No palpable stepoffs. Negative Spurling's maneuver. Mild loss of lordosis with extension of the neck. Grip strength and sensation normal in bilateral hands Strength good C4 to T1 distribution No sensory change to C4 to T1 Negative Hoffman sign bilaterally Reflexes normal  Osteopathic findings C2 flexed rotated and side bent right C4 flexed rotated and side bent left C6 flexed rotated and side bent left T3 extended rotated and side bent right inhaled third rib T9 extended rotated and side bent left L2 flexed rotated and side bent right Sacrum right on right     Impression and Recommendations:     This case required medical decision making of moderate complexity.      Note: This dictation was prepared with Dragon dictation along with smaller phrase technology. Any transcriptional errors that result from this process are unintentional.

## 2017-08-08 ENCOUNTER — Encounter: Payer: Self-pay | Admitting: Family Medicine

## 2017-08-08 ENCOUNTER — Ambulatory Visit (INDEPENDENT_AMBULATORY_CARE_PROVIDER_SITE_OTHER): Payer: 59 | Admitting: Family Medicine

## 2017-08-08 VITALS — BP 120/74 | HR 79 | Ht 75.0 in | Wt 208.0 lb

## 2017-08-08 DIAGNOSIS — M5412 Radiculopathy, cervical region: Secondary | ICD-10-CM | POA: Diagnosis not present

## 2017-08-08 DIAGNOSIS — M999 Biomechanical lesion, unspecified: Secondary | ICD-10-CM | POA: Diagnosis not present

## 2017-08-08 NOTE — Patient Instructions (Signed)
Good to see you  You are doing great  I hope allergies get under control  See me again in 2-3 months

## 2017-08-08 NOTE — Assessment & Plan Note (Signed)
Decision today to treat with OMT was based on Physical Exam  After verbal consent patient was treated with HVLA, ME, FPR techniques in cervical, thoracic, lumbar and sacral areas  Patient tolerated the procedure well with improvement in symptoms  Patient given exercises, stretches and lifestyle modifications  See medications in patient instructions if given  Patient will follow up in 1-2 weeks 

## 2017-08-08 NOTE — Assessment & Plan Note (Signed)
Stable at the moment.  Continues to respond well to conservative therapy.  Patient has been doing well with the medications.  No changes at this time.  Discussed continuing the posture and ergonomics.  Follow-up with me 2 to 3 months

## 2017-09-02 ENCOUNTER — Other Ambulatory Visit: Payer: Self-pay | Admitting: Family Medicine

## 2017-09-02 ENCOUNTER — Ambulatory Visit: Payer: Self-pay

## 2017-09-02 ENCOUNTER — Encounter: Payer: Self-pay | Admitting: Family Medicine

## 2017-09-02 ENCOUNTER — Ambulatory Visit: Payer: 59 | Admitting: Family Medicine

## 2017-09-02 VITALS — BP 134/80 | HR 92 | Temp 98.0°F | Ht 75.0 in | Wt 205.5 lb

## 2017-09-02 DIAGNOSIS — I10 Essential (primary) hypertension: Secondary | ICD-10-CM

## 2017-09-02 DIAGNOSIS — L270 Generalized skin eruption due to drugs and medicaments taken internally: Secondary | ICD-10-CM | POA: Diagnosis not present

## 2017-09-02 LAB — CBC
HEMATOCRIT: 41.6 % (ref 38.5–50.0)
Hemoglobin: 14.4 g/dL (ref 13.2–17.1)
MCH: 31.3 pg (ref 27.0–33.0)
MCHC: 34.6 g/dL (ref 32.0–36.0)
MCV: 90.4 fL (ref 80.0–100.0)
MPV: 12.4 fL (ref 7.5–12.5)
Platelets: 182 10*3/uL (ref 140–400)
RBC: 4.6 10*6/uL (ref 4.20–5.80)
RDW: 11.9 % (ref 11.0–15.0)
WBC: 7.4 10*3/uL (ref 3.8–10.8)

## 2017-09-02 LAB — SEDIMENTATION RATE: SED RATE: 2 mm/h (ref 0–20)

## 2017-09-02 MED ORDER — METHYLPREDNISOLONE SODIUM SUCC 125 MG IJ SOLR
125.0000 mg | Freq: Once | INTRAMUSCULAR | Status: AC
  Administered 2017-09-02: 125 mg via INTRAMUSCULAR

## 2017-09-02 MED ORDER — PREDNISONE 10 MG (21) PO TBPK: ORAL_TABLET | ORAL | 0 refills | Status: DC

## 2017-09-02 MED FILL — LISINOPRIL 20 MG TABLET: 20 | 90 days supply | Qty: 90 | Fill #0

## 2017-09-02 MED FILL — predniSONE 10 MG TABS: 10 | 6 days supply | Qty: 21 | Fill #0

## 2017-09-02 NOTE — Progress Notes (Signed)
Subjective:  Patient ID: Stephen Dimmer., male    DOB: [birth year]  Age: 57 y.o. MRN: [id number]  CC: Rash   HPI Stephen [name]. presents for evaluation of a rash that was noted this morning when he got up from bed.  It is located on his upper thighs and moves around the belt line.  It is pruritic and burns and stings.  Rashes present to a lesser extent in his axilla area.  Patient denies any involvement inside the mouth.  There is no fever or chills.  There is been no new foods in his diet.  All of his skin contacts are the same.  No change in soaps or laundry detergents.  Denies malaise or fatigue.  He did take tart cherry juice pills and tumoric liquid for joint aches last night.  These are supplements that came from the health food store.  He has never taken them before to his knowledge.  Outpatient Medications Prior to Visit  Medication Sig Dispense Refill  . fish oil-omega-3 fatty acids 1000 MG capsule Take 2 g by mouth daily.      Marland Kitchen lisinopril (PRINIVIL,ZESTRIL) 20 MG tablet Take 1 tablet (20 mg total) by mouth daily. 90 tablet 1  . Multiple Vitamin (MULTIVITAMIN) tablet Take 1 tablet by mouth daily.      . pravastatin (PRAVACHOL) 40 MG tablet Take 1 tablet (40 mg total) by mouth at bedtime. 90 tablet 1  . venlafaxine XR (EFFEXOR XR) 75 MG 24 hr capsule Take 1 capsule (75 mg total) by mouth daily with breakfast. 90 capsule 3   No facility-administered medications prior to visit.     ROS Review of Systems  Constitutional: Negative for chills, fatigue, fever and unexpected weight change.  Respiratory: Negative.  Negative for chest tightness and shortness of breath.   Cardiovascular: Negative for chest pain.  Gastrointestinal: Negative.  Negative for nausea and vomiting.  Genitourinary: Negative.   Skin: Positive for color change and rash.  Allergic/Immunologic: Negative for immunocompromised state.  Neurological: Negative for weakness and headaches.  Hematological: Does not  bruise/bleed easily.  Psychiatric/Behavioral: Negative.     Objective:  BP 134/80   Pulse 92   Temp 98 F (36.7 C)   Ht 6\' 3"  (1.905 m)   Wt 205 lb 8 oz (93.2 kg)   SpO2 98%   BMI 25.69 kg/m   BP Readings from Last 3 Encounters:  09/02/17 134/80  08/08/17 120/74  08/04/17 136/80    Wt Readings from Last 3 Encounters:  09/02/17 205 lb 8 oz (93.2 kg)  08/08/17 208 lb (94.3 kg)  08/04/17 204 lb 2 oz (92.6 kg)    Physical Exam  Constitutional: He is oriented to person, place, and time. He appears well-developed and well-nourished. No distress.  HENT:  Head: Normocephalic and atraumatic.  Right Ear: External ear normal.  Left Ear: External ear normal.  Eyes: Pupils are equal, round, and reactive to light. Conjunctivae and EOM are normal. Right eye exhibits no discharge. Left eye exhibits no discharge. No scleral icterus.  Neck: No JVD present. No tracheal deviation present.  Pulmonary/Chest: Effort normal.  Neurological: He is alert and oriented to person, place, and time.  Skin: Rash noted. He is not diaphoretic. There is erythema.     Psychiatric: He has a normal mood and affect. His behavior is normal. Thought content normal.    Lab Results  Component Value Date   WBC 5.1 02/15/2017   HGB 14.5 02/15/2017  HCT 44.4 02/15/2017   PLT 190.0 02/15/2017   GLUCOSE 101 (H) 02/15/2017   CHOL 151 02/15/2017   TRIG 75.0 02/15/2017   HDL 48.80 02/15/2017   LDLCALC 87 02/15/2017   ALT 36 02/15/2017   AST 23 02/15/2017   NA 144 02/15/2017   K 4.3 02/15/2017   CL 106 02/15/2017   CREATININE 1.01 02/15/2017   BUN 22 02/15/2017   CO2 31 02/15/2017   TSH 0.69 02/15/2017   PSA 0.68 02/15/2017    Dg Cervical Spine Complete  Result Date: 03/04/2017 CLINICAL DATA:  Cervicalgia with radicular symptoms EXAM: CERVICAL SPINE - COMPLETE 4+ VIEW COMPARISON:  Cervical spine CT September 14, 2010 FINDINGS: Frontal, lateral, open-mouth odontoid, and bilateral oblique views were  obtained. There is no fracture or spondylolisthesis. Prevertebral soft tissues and predental space regions are normal. There is moderate disc space narrowing at C5-6 and C6-7. Other disc spaces appear normal. There is facet hypertrophy with exit foraminal narrowing at C5-6 and C6-7 bilaterally. Lung apices are clear. There is mild calcification in the left carotid artery. IMPRESSION: Osteoarthritic change at C5-6 and C6-7. No fracture or spondylolisthesis. Mild calcification in left carotid artery. Electronically Signed   By: Lowella Grip III M.D.   On: 03/04/2017 09:50    Assessment & Plan:   Stephen [name] was seen today for rash.  Diagnoses and all orders for this visit:  Dermatitis due to drug reaction -     predniSONE (STERAPRED UNI-PAK 21 TAB) 10 MG (21) TBPK tablet; Starting tomorrow: Take 6 pills, 5 the next day and then 4, 3, 2 and 1. -     methylPREDNISolone sodium succinate (SOLU-MEDROL) 125 mg/2 mL injection 125 mg  Drug-induced erythroderma -     CBC -     Sedimentation rate -     predniSONE (STERAPRED UNI-PAK 21 TAB) 10 MG (21) TBPK tablet; Starting tomorrow: Take 6 pills, 5 the next day and then 4, 3, 2 and 1.   I am having Stephen Dimmer. "Ed" start on predniSONE. I am also having him maintain his fish oil-omega-3 fatty acids, multivitamin, pravastatin, venlafaxine XR, and lisinopril. We administered methylPREDNISolone sodium succinate.  Meds ordered this encounter  Medications  . predniSONE (STERAPRED UNI-PAK 21 TAB) 10 MG (21) TBPK tablet    Sig: Starting tomorrow: Take 6 pills, 5 the next day and then 4, 3, 2 and 1.    Dispense:  21 tablet    Refill:  0  . methylPREDNISolone sodium succinate (SOLU-MEDROL) 125 mg/2 mL injection 125 mg   Patient will start his prednisone tomorrow.  Advised him to call me over the weekend if his symptoms become worse.  I will check with Izora Gala on Monday to see how he is doing.  Follow-up: Return in about 2 days (around 09/04/2017), or if  symptoms worsen or fail to improve.  [name], MD

## 2017-09-02 NOTE — Patient Instructions (Signed)
Drug Rash A drug rash is a change in the color or texture of the skin that is caused by a drug. It can develop minutes, hours, or days after the person takes the drug. What are the causes? This condition is usually caused by a drug allergy. It can also be caused by exposure to sunlight after taking a drug that makes the skin sensitive to light. Drugs that commonly cause rashes include:  Penicillin.  Antibiotic medicines.  Medicines that treat seizures.  Medicines that treat cancer (chemotherapy).  Aspirin and other nonsteroidal anti-inflammatory drugs (NSAIDs).  Injectable dyes that contain iodine.  Insulin.  What are the signs or symptoms? Symptoms of this condition include:  Redness.  Tiny bumps.  Peeling.  Itching.  Itchy welts (hives).  Swelling.  The rash may appear on a small area of skin or all over the body. How is this diagnosed? To diagnose the condition, your health care provider will do a physical exam. He or she may also order tests to find out which drug caused the rash. Tests to find the cause of a rash include:  Skin tests.  Blood tests.  Drug challenge. For this test, you stop taking all of the drugs that you do not need to take, and then you start taking them again by adding back one of the drugs at a time.  How is this treated? A drug rash may be treated with medicines, including:  Antihistamines. These may be given to relieve itching.  An NSAID. This may be given to reduce swelling and treat pain.  A steroid drug. This may be given to reduce swelling.  The rash usually goes away when the person stops taking the drug that caused it. Follow these instructions at home:  Take medicines only as directed by your health care provider.  Let all of your health care providers know about any drug reactions you have had in the past.  If you have hives, take a cool shower or use a cool compress to relieve itchiness. Contact a health care provider  if:  You have a fever.  Your rash is not going away.  Your rash gets worse.  Your rash comes back.  You have wheezing or coughing. Get help right away if:  You start to have breathing problems.  You start to have shortness of breath.  You face or throat starts to swell.  You have severe weakness with dizziness or fainting.  You have chest pain. This information is not intended to replace advice given to you by your health care provider. Make sure you discuss any questions you have with your health care provider. Document Released: 04/15/2004 Document Revised: 08/14/2015 Document Reviewed: 01/02/2014 Elsevier Interactive Patient Education  2018 Elsevier Inc.  

## 2017-09-02 NOTE — Telephone Encounter (Signed)
Noted  

## 2017-09-02 NOTE — Telephone Encounter (Signed)
rec'd call from pt. to report onset of rash in bilateral hip region this AM.  Described the rash as red, non-raised, pinpoint areas, that itch. Reported he took Benadryl 25 mg cap. Between 9:30-10:00 AM.  Stated the brightness of the red rash "has become a little darker red."  Stated that "nothing has changed in use of soaps/ detergent, or medication."  Denied any rash, redness, or swelling of face, neck, or chest.  Denied any symptoms of swelling in mouth/ tongue/ throat.  Appt. given at 3:00 PM today, with PCP.  Advised to have another adult drive him to the appt., due to poss. drowsiness, caused by Benadryl.  Care advice given per protocol. Verb. Understanding.         Reason for Disposition . [1] Purple or blood-colored rash (spots or dots) AND [2] no fever AND [3] sounds well to triager  Answer Assessment - Initial Assessment Questions 1. APPEARANCE of RASH: "Describe the rash." (e.g., spots, blisters, raised areas, skin peeling, scaly)     Red, itchy rash; non-raised; some pinpoint areas  2. SIZE: "How big are the spots?" (e.g., tip of pen, eraser, coin; inches, centimeters)     Some pinpoint spots  3. LOCATION: "Where is the rash located?"     Bilateral hips  4. COLOR: "What color is the rash?" (Note: It is difficult to assess rash color in people with darker-colored skin. When this situation occurs, simply ask the caller to describe what they see.)     Was a brighter red, now is a little darker 5. ONSET: "When did the rash begin?"    This AM 6. FEVER: "Do you have a fever?" If so, ask: "What is your temperature, how was it measured, and when did it start?"     Denied  7. ITCHING: "Does the rash itch?" If so, ask: "How bad is the itch?" (Scale 1-10; or mild, moderate, severe)     Some itching; reduced after taking Benadryl ; took Benadryl 25 mg at 9:30-10:00 AM  8. CAUSE: "What do you think is causing the rash?"    Unknown; questioned if this is an allergic reaction 9. MEDICATION  FACTORS: "Have you started any new medications within the last 2 weeks?" (e.g., antibiotics)      Denied any medication change 10. OTHER SYMPTOMS: "Do you have any other symptoms?" (e.g., dizziness, headache, sore throat, joint pain)      Denied dizziness, headache, sore throat, or joint pain  Protocols used: RASH OR REDNESS - [facility]

## 2017-09-08 DIAGNOSIS — H524 Presbyopia: Secondary | ICD-10-CM | POA: Diagnosis not present

## 2017-09-08 DIAGNOSIS — H5202 Hypermetropia, left eye: Secondary | ICD-10-CM | POA: Diagnosis not present

## 2017-09-08 DIAGNOSIS — H5211 Myopia, right eye: Secondary | ICD-10-CM | POA: Diagnosis not present

## 2017-10-20 ENCOUNTER — Encounter: Payer: Self-pay | Admitting: Family Medicine

## 2017-10-20 ENCOUNTER — Other Ambulatory Visit: Payer: Self-pay | Admitting: Family Medicine

## 2017-10-20 ENCOUNTER — Ambulatory Visit (INDEPENDENT_AMBULATORY_CARE_PROVIDER_SITE_OTHER): Payer: 59 | Admitting: Family Medicine

## 2017-10-20 VITALS — BP 142/80 | HR 70 | Ht 75.0 in | Wt 207.0 lb

## 2017-10-20 DIAGNOSIS — E782 Mixed hyperlipidemia: Secondary | ICD-10-CM

## 2017-10-20 DIAGNOSIS — M5412 Radiculopathy, cervical region: Secondary | ICD-10-CM | POA: Diagnosis not present

## 2017-10-20 DIAGNOSIS — M999 Biomechanical lesion, unspecified: Secondary | ICD-10-CM

## 2017-10-20 MED FILL — PRAVASTATIN NA 40 MG TAB: 40 | 90 days supply | Qty: 90 | Fill #0

## 2017-10-20 NOTE — Assessment & Plan Note (Signed)
Decision today to treat with OMT was based on Physical Exam  After verbal consent patient was treated with HVLA, ME, FPR techniques in cervical, thoracic, rib, lumbar and sacral areas  Patient tolerated the procedure well with improvement in symptoms  Patient given exercises, stretches and lifestyle modifications  See medications in patient instructions if given  Patient will follow up in 4 weeks 

## 2017-10-20 NOTE — Progress Notes (Signed)
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 States his back is doing better today.  Patient has been doing relatively well.  Still has some right-sided cervical radiculopathy.  Making progress though.  Not having as much weakness.  Still notices that the right arm fatigues regularly.      Past Medical History:  Diagnosis Date  . Colon polyps   . Detached retina, right 1992   Dr. [name] at [facility] did orbital band procedure with good results.   . High cholesterol   . Hypertension   . Poison ivy dermatitis 07/29/2015   Past Surgical History:  Procedure Laterality Date  . RETINAL DETACHMENT SURGERY     Social History   Socioeconomic History  . Marital status: Married    Spouse name: Not on file  . Number of children: 2  . Years of education: 33  . Highest education level: Not on file  Occupational History  . Occupation: busines    Employer: [employer]  Social Needs  . Financial resource strain: Not on file  . Food insecurity:    Worry: Not on file    Inability: Not on file  . Transportation needs:    Medical: Not on file    Non-medical: Not on file  Tobacco Use  . Smoking status: Never Smoker  . Smokeless tobacco: Never Used  Substance and Sexual Activity  . Alcohol use: Yes    Alcohol/week: 0.6 oz    Types: 1 drink(s) per week  . Drug use: No  . Sexual activity: Yes    Partners: Female  Lifestyle  . Physical activity:    Days per week: Not on file    Minutes per session: Not on file  . Stress: Not on file  Relationships  . Social connections:    Talks on phone: Not on file    Gets together: Not on file    Attends religious service: Not on file    Active member of club or organization: Not on file    Attends meetings of clubs or organizations: Not on file    Relationship status:  Not on file  Other Topics Concern  . Not on file  Social History Narrative   HSG, [employer] - BS business admin. Married '84 - 1 son '86, 1 dtr -'56. 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 ACP discussed and he is referred to [email or web address].   No Known Allergies Family History  Problem Relation Age of Onset  . Parkinsonism Mother   . Cancer Father        colon cancer with mets  . Hyperlipidemia Father   . Hypertension Father   . Hypertension Brother   . Diabetes Neg Hx   . Heart disease Neg Hx      Past medical history, social, surgical and family history all reviewed in electronic medical record.  No pertanent information unless stated regarding to the chief complaint.   Review of Systems:Review of systems updated and as accurate as of 10/20/17  No headache, visual changes, nausea, vomiting, diarrhea, constipation, dizziness, abdominal pain, skin rash, fevers, chills, night sweats, weight loss, swollen lymph nodes, body aches, joint swelling,  chest pain, shortness of breath, mood changes.  Mild positive muscle aches  Objective  Blood pressure (!) 142/80, pulse 70, height 6\' 3"  (1.905 m), weight 207 lb (93.9 kg), SpO2 96 %. Systems examined below as of 10/20/17   General: No apparent distress alert and oriented x3 mood and affect normal, dressed appropriately.  HEENT: Pupils equal, extraocular movements intact  Respiratory: Patient's speak in full sentences and does not appear short of breath  Cardiovascular: No lower extremity edema, non tender, no erythema  Skin: Warm dry intact with no signs of infection or rash on extremities or on axial skeleton.  Abdomen: Soft nontender  Neuro: Cranial nerves II through XII are intact, neurovascularly intact in all extremities with 2+ DTRs and 2+ pulses.  Lymph: No lymphadenopathy of posterior or anterior cervical chain or axillae bilaterally.  Gait normal  with good balance and coordination.  MSK:  Non tender with full range of motion and good stability and symmetric strength and tone of shoulders, elbows, wrist, hip, knee and ankles bilaterally.  Neck: Inspection loss of lordosis. No palpable stepoffs. Negative Spurling's maneuver. Patient does have some loss of extension and sidebending to the right mild crepitus Grip strength and sensation normal in bilateral hands Strength good C4 to T1 distribution No sensory change to C4 to T1 Negative Hoffman sign bilaterally Reflexes normal Right trapezius tightness  Osteopathic findings C2 flexed rotated and side bent right C4 flexed rotated and side bent left T3 extended rotated and side bent right inhaled third rib T6 extended rotated and side bent left L3 flexed rotated and side bent right Sacrum right on right     Impression and Recommendations:     This case required medical decision making of moderate complexity.      Note: This dictation was prepared with Dragon dictation along with smaller phrase technology. Any transcriptional errors that result from this process are unintentional.

## 2017-10-20 NOTE — Patient Instructions (Signed)
Good to see you  Overall I am impressed At the gym try the cable machine, rows and incline bench.  Pull down is ok but focus on slow movement.  See me again in 7 weeks

## 2017-10-20 NOTE — Assessment & Plan Note (Signed)
No weakness noted today.  No radicular symptoms.  Is responding fairly well.  Continue the Effexor at the dose that is.  Discussed icing regimen and home exercises.

## 2017-11-01 MED FILL — VENLAFAXINE HCL ER 37.5 MG: 37.5 | 90 days supply | Qty: 180 | Fill #0

## 2017-11-15 ENCOUNTER — Encounter: Payer: Self-pay | Admitting: Family Medicine

## 2017-11-15 ENCOUNTER — Ambulatory Visit (INDEPENDENT_AMBULATORY_CARE_PROVIDER_SITE_OTHER): Payer: 59

## 2017-11-15 ENCOUNTER — Ambulatory Visit: Payer: 59 | Admitting: Family Medicine

## 2017-11-15 VITALS — BP 122/74 | HR 72 | Ht 75.0 in | Wt 207.0 lb

## 2017-11-15 DIAGNOSIS — M79641 Pain in right hand: Secondary | ICD-10-CM

## 2017-11-15 NOTE — Assessment & Plan Note (Signed)
No fracture was appreciated and does not appear to be a tendinitis.  Does not act like his previous poison ivy infections.  Possible for an allergic reaction as he is cutting down a tree or a bite from an insect.  Does not appear to be an infection and no history of gout -He will continue Zyrtec and can take Duexis with this. -Counseled on supportive care. -He will call back if any changes.  May need to consider x-ray versus antibiotic versus prednisone.

## 2017-11-15 NOTE — Patient Instructions (Signed)
Nice to meet you  Please continue the ice and elevation  Please continue the anti-histamine  Please let me know if you develop redness, worsening pain or a rash  Please take the anti-inflammatory for pain  The swelling may go up your arm as it gets absorbed.

## 2017-11-15 NOTE — Progress Notes (Signed)
Stephen [name]. - 58 y.o. male MRN [id number]  Date of birth: 20-Apr-1960  SUBJECTIVE:  Including CC & ROS.  Chief Complaint  Patient presents with  . Hand Pain    Stephen [name]. is a 57 y.o. male that is presenting with right hand pain and swelling. He was using a weed eater two days ago and noticed pain and dorsal aspect of his hand.  Denies locking. He has been using ice and tylenol. Denies history of gout.  He denies hitting his hand or being bitten.  Denies any change in medications.  Cannot think of an inciting event.  Feels like his symptoms have improved since using ice.  The swelling is located to the radial aspect of the dorsal aspect of his hand.  He has never had any similar symptoms to like this.   Review of Systems  Constitutional: Negative for fever.  HENT: Negative for congestion.   Respiratory: Negative for cough.   Cardiovascular: Negative for chest pain.  Gastrointestinal: Negative for abdominal pain.  Musculoskeletal: Negative for gait problem.  Skin: Negative for color change.  Neurological: Negative for numbness.  Hematological: Negative for adenopathy.  Psychiatric/Behavioral: Negative for agitation.    HISTORY: Past Medical, Surgical, Social, and Family History Reviewed & Updated per EMR.   Pertinent Historical Findings include:  Past Medical History:  Diagnosis Date  . Colon polyps   . Detached retina, right 1992   Dr. [name] at [facility] did orbital band procedure with good results.   . High cholesterol   . Hypertension   . Poison ivy dermatitis 07/29/2015    Past Surgical History:  Procedure Laterality Date  . RETINAL DETACHMENT SURGERY      No Known Allergies  Family History  Problem Relation Age of Onset  . Parkinsonism Mother   . Cancer Father        colon cancer with mets  . Hyperlipidemia Father   . Hypertension Father   . Hypertension Brother   . Diabetes Neg Hx   . Heart disease Neg Hx      Social History   Socioeconomic  History  . Marital status: Married    Spouse name: Not on file  . Number of children: 2  . Years of education: 46  . Highest education level: Not on file  Occupational History  . Occupation: busines    Employer: [employer]  Social Needs  . Financial resource strain: Not on file  . Food insecurity:    Worry: Not on file    Inability: Not on file  . Transportation needs:    Medical: Not on file    Non-medical: Not on file  Tobacco Use  . Smoking status: Never Smoker  . Smokeless tobacco: Never Used  Substance and Sexual Activity  . Alcohol use: Yes    Alcohol/week: 1.0 standard drinks    Types: 1 drink(s) per week  . Drug use: No  . Sexual activity: Yes    Partners: Female  Lifestyle  . Physical activity:    Days per week: Not on file    Minutes per session: Not on file  . Stress: Not on file  Relationships  . Social connections:    Talks on phone: Not on file    Gets together: Not on file    Attends religious service: Not on file    Active member of club or organization: Not on file    Attends meetings of clubs or organizations: Not on file  Relationship status: Not on file  . Intimate partner violence:    Fear of current or ex partner: Not on file    Emotionally abused: Not on file    Physically abused: Not on file    Forced sexual activity: Not on file  Other Topics Concern  . Not on file  Social History Narrative   HSG, [employer] - BS business admin. Married '84 - 1 son '86, 1 dtr -'39. 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 ACP discussed and he is referred to [email or web address].     PHYSICAL EXAM:  VS: BP 122/74 (BP Location: Left Arm, Patient Position: Sitting, Cuff Size: Normal)   Pulse 72   Ht 6\' 3"  (1.905 m)   Wt 207 lb (93.9 kg)   SpO2 98%   BMI 25.87 kg/m  Physical Exam Gen: NAD, alert, cooperative with exam, well-appearing ENT: normal lips, normal nasal mucosa,    Eye: normal EOM, normal conjunctiva and lids CV:  no edema, +2 pedal pulses   Resp: no accessory muscle use, non-labored,  Skin: no rashes, no areas of induration  Neuro: normal tone, normal sensation to touch Psych:  normal insight, alert and oriented MSK:  Right hand: Obvious swelling of the dorsal aspect of the right hand.  Occurring over the index finger predominantly. Limited flexion of the index finger due to swelling and pain. Normal thumb range of motion opposition. Normal pincer grasp. Normal finger abduction and abduction strength to resistance. Normal alignment. Normal wrist range of motion. Normal grip strength. Neurovascular intact  Limited ultrasound: Right hand:  Normal-appearing metacarpals Subcutaneous soft tissue swelling evident in the dorsal aspect of the hand. Normal-appearing first and second dorsal compartment tendons. Normal-appearing second MCP joint. Normal-appearing CMC joint. No abnormalities reviewed in the carpal bones.  Summary: soft tissue swelling   Ultrasound and interpretation by Clearance Coots, MD      ASSESSMENT & PLAN:   Right hand pain No fracture was appreciated and does not appear to be a tendinitis.  Does not act like his previous poison ivy infections.  Possible for an allergic reaction as he is cutting down a tree or a bite from an insect.  Does not appear to be an infection and no history of gout -He will continue Zyrtec and can take Duexis with this. -Counseled on supportive care. -He will call back if any changes.  May need to consider x-ray versus antibiotic versus prednisone.

## 2017-11-28 MED FILL — LISINOPRIL 20 MG TABLET: 20 | 90 days supply | Qty: 90 | Fill #1

## 2017-12-07 NOTE — Progress Notes (Signed)
[name] [address] [place], [place] [address] Phone: [phone] Subjective:    I Stephen [name] am serving as a Education administrator for Dr. Hulan Saas.   CC: Back pain  JIR:[id number]  Stephen [name]. is a 57 y.o. male coming in with complaint of back pain. States his back is a little sore today. Right hand pain. Seen by Raeford Razor. Was trimming a tree when he hurt his hand. Tight when he makes a fist. Decreased grip strength. Initially thought it was an allergic reaction. Believed he was bit by something. Right knuckle was swollen. No numbness and tingling noted.  Onset- 3 weeks ago Location- Metacarpal  Character- Achy  Reliving factors- Ice, medication Severity-7 out of 10   Patient is also having still has neck pain.  No radiation of pain.  Has responded well to manipulation.  Some tightness.  Wants to feel better with his grandkids coming to visit    Past Medical History:  Diagnosis Date  . Colon polyps   . Detached retina, right 1992   Dr. [name] at [facility] did orbital band procedure with good results.   . High cholesterol   . Hypertension   . Poison ivy dermatitis 07/29/2015   Past Surgical History:  Procedure Laterality Date  . RETINAL DETACHMENT SURGERY     Social History   Socioeconomic History  . Marital status: Married    Spouse name: Not on file  . Number of children: 2  . Years of education: 96  . Highest education level: Not on file  Occupational History  . Occupation: busines    Employer: [employer]  Social Needs  . Financial resource strain: Not on file  . Food insecurity:    Worry: Not on file    Inability: Not on file  . Transportation needs:    Medical: Not on file    Non-medical: Not on file  Tobacco Use  . Smoking status: Never Smoker  . Smokeless tobacco: Never Used  Substance and Sexual Activity  . Alcohol use: Yes    Alcohol/week: 1.0 standard drinks    Types: 1 drink(s) per week  . Drug use: No  . Sexual  activity: Yes    Partners: Female  Lifestyle  . Physical activity:    Days per week: Not on file    Minutes per session: Not on file  . Stress: Not on file  Relationships  . Social connections:    Talks on phone: Not on file    Gets together: Not on file    Attends religious service: Not on file    Active member of club or organization: Not on file    Attends meetings of clubs or organizations: Not on file    Relationship status: Not on file  Other Topics Concern  . Not on file  Social History Narrative   HSG, [employer] - BS business admin. Married '84 - 1 son '86, 1 dtr -'80. 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 ACP discussed and he is referred to [email or web address].   No Known Allergies Family History  Problem Relation Age of Onset  . Parkinsonism Mother   . Cancer Father        colon cancer with mets  . Hyperlipidemia Father   . Hypertension Father   . Hypertension Brother   . Diabetes Neg Hx   . Heart disease Neg Hx     Current Outpatient Medications (  Endocrine & Metabolic):  .  predniSONE (DELTASONE) 50 MG tablet, Take 1 tablet (50 mg total) by mouth daily.  Current Outpatient Medications (Cardiovascular):  .  lisinopril (PRINIVIL,ZESTRIL) 20 MG tablet, Take 1 tablet (20 mg total) by mouth daily. .  pravastatin (PRAVACHOL) 40 MG tablet, TAKE 1 TABLET BY MOUTH AT BEDTIME.     Current Outpatient Medications (Other):  .  fish oil-omega-3 fatty acids 1000 MG capsule, Take 2 g by mouth daily.   .  Multiple Vitamin (MULTIVITAMIN) tablet, Take 1 tablet by mouth daily.   Marland Kitchen  venlafaxine XR (EFFEXOR XR) 75 MG 24 hr capsule, Take 1 capsule (75 mg total) by mouth daily with breakfast. .  doxycycline (VIBRA-TABS) 100 MG tablet, Take 1 tablet (100 mg total) by mouth 2 (two) times daily for 7 days.    Past medical history, social, surgical and family history all reviewed in electronic medical  record.  No pertanent information unless stated regarding to the chief complaint.   Review of Systems:  No headache, visual changes, nausea, vomiting, diarrhea, constipation, dizziness, abdominal pain, skin rash, fevers, chills, night sweats, weight loss, swollen lymph nodes, body aches, joint swelling, chest pain, shortness of breath, mood changes.  Positive muscle aches  Objective  Blood pressure (!) 144/84, pulse 69, height 6\' 3"  (1.905 m), weight 208 lb (94.3 kg), SpO2 98 %.   General: No apparent distress alert and oriented x3 mood and affect normal, dressed appropriately.  HEENT: Pupils equal, extraocular movements intact  Respiratory: Patient's speak in full sentences and does not appear short of breath  Cardiovascular: No lower extremity edema, non tender, no erythema  Skin: Warm dry intact with no signs of infection or rash on extremities or on axial skeleton.  Abdomen: Soft nontender  Neuro: Cranial nerves II through XII are intact, neurovascularly intact in all extremities with 2+ DTRs and 2+ pulses.  Lymph: No lymphadenopathy of posterior or anterior cervical chain or axillae bilaterally.  Gait normal with good balance and coordination.  MSK:  Non tender with full range of motion and good stability and symmetric strength and tone of shoulders, elbows, wrist, hip, knee and ankles bilaterally.  Hand exam shows the first metatarsal still significantly swollen.  Has swelling around the dorsal aspect of the hand.  Unable to make a full fist.  Neurovascularly intact.  Good capillary refill.  Mild warm to touch  Neck: Inspection loss of lordosis. No palpable stepoffs. Negative Spurling's maneuver. Mild limited range of motion in all planes by 5 degrees Grip strength and sensation normal in bilateral hands Strength good C4 to T1 distribution No sensory change to C4 to T1 Negative Hoffman sign bilaterally Reflexes normal Tightness of the trapezius bilaterally   Limited muscular  skeletal ultrasound was performed and interpreted by Lyndal Pulley  Limited ultrasound of patient's first metacarpal shows that patient does have effusion noted.  No cortical defect noted.  Patient does have between the first and second metacarpal joints and area that is round and cystic.  Mild increase in Doppler flow and some swelling of the soft tissue. Impression: Possible capsulitis but also possible small abscess formation noted.  Osteopathic findings C2 flexed rotated and side bent right C4 flexed rotated and side bent left T3 extended rotated and side bent right inhaled third rib T7 extended rotated and side bent left L2 flexed rotated and side bent right Sacrum right on right    Impression and Recommendations:     This case required medical decision  making of moderate complexity. The above documentation has been reviewed and is accurate and complete Lyndal Pulley, DO       Note: This dictation was prepared with Dragon dictation along with smaller phrase technology. Any transcriptional errors that result from this process are unintentional.

## 2017-12-08 ENCOUNTER — Ambulatory Visit: Payer: 59 | Admitting: Family Medicine

## 2017-12-08 ENCOUNTER — Ambulatory Visit: Payer: Self-pay

## 2017-12-08 ENCOUNTER — Encounter: Payer: Self-pay | Admitting: Family Medicine

## 2017-12-08 VITALS — BP 144/84 | HR 69 | Ht 75.0 in | Wt 208.0 lb

## 2017-12-08 DIAGNOSIS — M79641 Pain in right hand: Secondary | ICD-10-CM | POA: Diagnosis not present

## 2017-12-08 DIAGNOSIS — M999 Biomechanical lesion, unspecified: Secondary | ICD-10-CM | POA: Diagnosis not present

## 2017-12-08 DIAGNOSIS — M5412 Radiculopathy, cervical region: Secondary | ICD-10-CM

## 2017-12-08 MED ORDER — DOXYCYCLINE HYCLATE 100 MG PO TABS: 100.0000 mg | ORAL_TABLET | Freq: Two times a day (BID) | ORAL | 0 refills | Status: AC

## 2017-12-08 MED ORDER — PREDNISONE 50 MG PO TABS: 50.0000 mg | ORAL_TABLET | Freq: Every day | ORAL | 0 refills | Status: DC

## 2017-12-08 MED FILL — predniSONE 50 MG TABS: 50 | 5 days supply | Qty: 5 | Fill #0

## 2017-12-08 MED FILL — DOXYCYCLINE HYCLATE 100 MG: 100 | 7 days supply | Qty: 14 | Fill #0

## 2017-12-08 NOTE — Assessment & Plan Note (Signed)
Stable at the moment.  Continue the conservative therapy.  Responding well to the manipulation.  Continue the Effexor at this time.  Discussed posture and ergonomics.  Follow-up again in 4 to 8 weeks

## 2017-12-08 NOTE — Patient Instructions (Signed)
Good to see you  Ice is your friend Stay active Doxycycline 2 times a day for 7 days Prednisone daily for 5 days  See me again in 7-8 weeks otherwise for neck

## 2017-12-08 NOTE — Assessment & Plan Note (Signed)
Decision today to treat with OMT was based on Physical Exam  After verbal consent patient was treated with HVLA, ME, FPR techniques in cervical, thoracic, lumbar and sacral areas  Patient tolerated the procedure well with improvement in symptoms  Patient given exercises, stretches and lifestyle modifications  See medications in patient instructions if given  Patient will follow up in 4-6 weeks 

## 2017-12-08 NOTE — Assessment & Plan Note (Signed)
Possible infectious etiology versus possible uric acid deposition.  Patient given prednisone as well as doxycycline for further control.  Hold on any type of x-ray.  Follow-up again in 2 to 3 weeks if not completely resolved

## 2017-12-19 ENCOUNTER — Encounter: Payer: Self-pay | Admitting: Family Medicine

## 2018-01-20 MED FILL — PRAVASTATIN NA 40 MG TAB: 40 | 90 days supply | Qty: 90 | Fill #1

## 2018-01-25 NOTE — Progress Notes (Signed)
[name] [address] [place], [place] [address] Phone: [phone] Subjective:    I Stephen [name] am serving as a Education administrator for Dr. Hulan Saas.   CC: Neck pain and hand pain follow-up  FVC:[id number]  Legend Tumminello. is a 57 y.o. male coming in with complaint of hand pain. States that his hand is still sore.  Patient had what appeared to be a very small avulsion fracture versus possible synovitis.  Is now making progress but continues to have discomfort though.  Neck known degenerative disc disease.  No longer having any weakness.  Has been responding fairly well with all the other conservative therapy.  Feels good overall.  Mild tightness.     Past Medical History:  Diagnosis Date  . Colon polyps   . Detached retina, right 1992   Dr. [name] at [facility] did orbital band procedure with good results.   . High cholesterol   . Hypertension   . Poison ivy dermatitis 07/29/2015   Past Surgical History:  Procedure Laterality Date  . RETINAL DETACHMENT SURGERY     Social History   Socioeconomic History  . Marital status: Married    Spouse name: Not on file  . Number of children: 2  . Years of education: 43  . Highest education level: Not on file  Occupational History  . Occupation: busines    Employer: [employer]  Social Needs  . Financial resource strain: Not on file  . Food insecurity:    Worry: Not on file    Inability: Not on file  . Transportation needs:    Medical: Not on file    Non-medical: Not on file  Tobacco Use  . Smoking status: Never Smoker  . Smokeless tobacco: Never Used  Substance and Sexual Activity  . Alcohol use: Yes    Alcohol/week: 1.0 standard drinks    Types: 1 drink(s) per week  . Drug use: No  . Sexual activity: Yes    Partners: Female  Lifestyle  . Physical activity:    Days per week: Not on file    Minutes per session: Not on file  . Stress: Not on file  Relationships  . Social connections:    Talks  on phone: Not on file    Gets together: Not on file    Attends religious service: Not on file    Active member of club or organization: Not on file    Attends meetings of clubs or organizations: Not on file    Relationship status: Not on file  Other Topics Concern  . Not on file  Social History Narrative   HSG, [employer] - BS business admin. Married '84 - 1 son '86, 1 dtr -'55. 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 ACP discussed and he is referred to [email or web address].   No Known Allergies Family History  Problem Relation Age of Onset  . Parkinsonism Mother   . Cancer Father        colon cancer with mets  . Hyperlipidemia Father   . Hypertension Father   . Hypertension Brother   . Diabetes Neg Hx   . Heart disease Neg Hx      Current Outpatient Medications (Cardiovascular):  .  lisinopril (PRINIVIL,ZESTRIL) 20 MG tablet, Take 1 tablet (20 mg total) by mouth daily. .  pravastatin (PRAVACHOL) 40 MG tablet, TAKE 1 TABLET BY MOUTH AT BEDTIME.     Current Outpatient  Medications (Other):  .  fish oil-omega-3 fatty acids 1000 MG capsule, Take 2 g by mouth daily.   .  Multiple Vitamin (MULTIVITAMIN) tablet, Take 1 tablet by mouth daily.   Marland Kitchen  venlafaxine XR (EFFEXOR XR) 75 MG 24 hr capsule, Take 1 capsule (75 mg total) by mouth daily with breakfast.    Past medical history, social, surgical and family history all reviewed in electronic medical record.  No pertanent information unless stated regarding to the chief complaint.   Review of Systems:  No headache, visual changes, nausea, vomiting, diarrhea, constipation, dizziness, abdominal pain, skin rash, fevers, chills, night sweats, weight loss, swollen lymph nodes, body aches, joint swelling, muscle aches, chest pain, shortness of breath, mood changes.   Objective  Blood pressure (!) 150/70, pulse 74, height 6\' 3"  (1.905 m), weight 211 lb (95.7 kg),  SpO2 96 %.   General: No apparent distress alert and oriented x3 mood and affect normal, dressed appropriately.  HEENT: Pupils equal, extraocular movements intact  Respiratory: Patient's speak in full sentences and does not appear short of breath  Cardiovascular: No lower extremity edema, non tender, no erythema  Skin: Warm dry intact with no signs of infection or rash on extremities or on axial skeleton.  Abdomen: Soft nontender  Neuro: Cranial nerves II through XII are intact, neurovascularly intact in all extremities with 2+ DTRs and 2+ pulses.  Lymph: No lymphadenopathy of posterior or anterior cervical chain or axillae bilaterally.  Gait normal with good balance and coordination.  MSK:  Non tender with full range of motion and good stability and symmetric strength and tone of shoulders, elbows, wrist, hip, knee and ankles bilaterally.  Neck: Inspection mild loss of lordosis. No palpable stepoffs. Negative Spurling's maneuver. Good range of motion in all planes of 5 to 10 degrees Grip strength and sensation normal in bilateral hands Strength good C4 to T1 distribution No sensory change to C4 to T1 Negative Hoffman sign bilaterally Reflexes normal Tightness in the trapezius bilaterally  Hand exam shows the patient still has some mild swelling over the MCP.  Mildly tender.  Still lacks last 5 degrees of flexion of the index finger MCP no angulation noted.  Good grip strength  Osteopathic findings C6 flexed rotated and side bent right  T3 extended rotated and side bent right inhaled third rib L2 flexed rotated and side bent right Sacrum right on right    Impression and Recommendations:     This case required medical decision making of moderate complexity. The above documentation has been reviewed and is accurate and complete Stephen Pulley, DO       Note: This dictation was prepared with Dragon dictation along with smaller phrase technology. Any transcriptional errors that  result from this process are unintentional.

## 2018-01-27 ENCOUNTER — Ambulatory Visit (INDEPENDENT_AMBULATORY_CARE_PROVIDER_SITE_OTHER)
Admission: RE | Admit: 2018-01-27 | Discharge: 2018-01-27 | Disposition: A | Discharge status: Home / Self Care | Payer: 59 | Source: Ambulatory Visit | Attending: Family Medicine | Admitting: Family Medicine

## 2018-01-27 ENCOUNTER — Encounter: Payer: Self-pay | Admitting: Family Medicine

## 2018-01-27 ENCOUNTER — Ambulatory Visit (INDEPENDENT_AMBULATORY_CARE_PROVIDER_SITE_OTHER): Payer: 59 | Admitting: Family Medicine

## 2018-01-27 VITALS — BP 150/70 | HR 74 | Ht 75.0 in | Wt 211.0 lb

## 2018-01-27 DIAGNOSIS — M999 Biomechanical lesion, unspecified: Secondary | ICD-10-CM

## 2018-01-27 DIAGNOSIS — M5412 Radiculopathy, cervical region: Secondary | ICD-10-CM

## 2018-01-27 DIAGNOSIS — M79641 Pain in right hand: Secondary | ICD-10-CM | POA: Diagnosis not present

## 2018-01-27 DIAGNOSIS — M7989 Other specified soft tissue disorders: Secondary | ICD-10-CM | POA: Diagnosis not present

## 2018-01-27 NOTE — Patient Instructions (Signed)
Good to see you  Stephen [name] is your friend Buddy tape the finger next 2 weeks during the day  Colchicine 2 times a day for 3 days and write me on Monday  Xray downstairs See me again in 4-8 weeks

## 2018-01-28 ENCOUNTER — Encounter: Payer: Self-pay | Admitting: Family Medicine

## 2018-01-28 NOTE — Assessment & Plan Note (Signed)
X-rays ordered today to rule out occult fracture.  Likely more of a synovitis.  Trial of gout medication given.

## 2018-01-28 NOTE — Assessment & Plan Note (Signed)
Doing well at this time.  Still responding to manipulation.  Discussed icing regimen and home exercise.  Discussed which activities of doing which wants to avoid.  Increase activity as tolerated.  Follow-up 4 to 8 weeks

## 2018-01-28 NOTE — Assessment & Plan Note (Signed)
Decision today to treat with OMT was based on Physical Exam  After verbal consent patient was treated with HVLA, ME, FPR techniques in cervical, thoracic, rib lumbar and sacral areas  Patient tolerated the procedure well with improvement in symptoms  Patient given exercises, stretches and lifestyle modifications  See medications in patient instructions if given  Patient will follow up in 4-8 weeks 

## 2018-02-01 ENCOUNTER — Encounter: Payer: Self-pay | Admitting: Family Medicine

## 2018-02-23 ENCOUNTER — Ambulatory Visit: Payer: 59 | Admitting: Family Medicine

## 2018-02-23 ENCOUNTER — Encounter: Payer: Self-pay | Admitting: Family Medicine

## 2018-02-23 DIAGNOSIS — J029 Acute pharyngitis, unspecified: Secondary | ICD-10-CM | POA: Diagnosis not present

## 2018-02-23 MED ORDER — AMOXICILLIN 500 MG PO CAPS: 500.0000 mg | ORAL_CAPSULE | Freq: Three times a day (TID) | ORAL | 0 refills | Status: AC

## 2018-02-23 MED FILL — AMOXICILLIN 500 MG CAPSULE: 500 | 10 days supply | Qty: 30 | Fill #0

## 2018-02-23 NOTE — Progress Notes (Signed)
Stephen [name]. - 57 y.o. male MRN [id number]  Date of birth: 19-Nov-1960  Subjective Chief Complaint  Patient presents with  . Sore Throat  . Cough  . Sinus Problem    HPI Stephen [name]. is a 57 y.o. male here today with complaint of sinus congestion, sore throat, and mild cough.  Symptoms started about 1 week ago and have not improved, actually worsened this morning.  + strep throat contact.  He denies fever, chills, chest pain, shortness of breath, or wheezing.  Using OTC without much improvement.  He is pushing fluids and is using a humidifier at home.   ROS:  A comprehensive ROS was completed and negative except as noted per HPI  No Known Allergies  Past Medical History:  Diagnosis Date  . Colon polyps   . Detached retina, right 1992   Dr. [name] at [facility] did orbital band procedure with good results.   . High cholesterol   . Hypertension   . Poison ivy dermatitis 07/29/2015    Past Surgical History:  Procedure Laterality Date  . RETINAL DETACHMENT SURGERY      Social History   Socioeconomic History  . Marital status: Married    Spouse name: Not on file  . Number of children: 2  . Years of education: 53  . Highest education level: Not on file  Occupational History  . Occupation: busines    Employer: [employer]  Social Needs  . Financial resource strain: Not on file  . Food insecurity:    Worry: Not on file    Inability: Not on file  . Transportation needs:    Medical: Not on file    Non-medical: Not on file  Tobacco Use  . Smoking status: Never Smoker  . Smokeless tobacco: Never Used  Substance and Sexual Activity  . Alcohol use: Yes    Alcohol/week: 1.0 standard drinks    Types: 1 drink(s) per week  . Drug use: No  . Sexual activity: Yes    Partners: Female  Lifestyle  . Physical activity:    Days per week: Not on file    Minutes per session: Not on file  . Stress: Not on file  Relationships  . Social connections:    Talks on phone: Not on  file    Gets together: Not on file    Attends religious service: Not on file    Active member of club or organization: Not on file    Attends meetings of clubs or organizations: Not on file    Relationship status: Not on file  Other Topics Concern  . Not on file  Social History Narrative   HSG, [employer] - BS business admin. Married '84 - 1 son '86, 1 dtr -'31. 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 ACP discussed and he is referred to [email or web address].    Family History  Problem Relation Age of Onset  . Parkinsonism Mother   . Cancer Father        colon cancer with mets  . Hyperlipidemia Father   . Hypertension Father   . Hypertension Brother   . Diabetes Neg Hx   . Heart disease Neg Hx     Health Maintenance  Topic Date Due  . TETANUS/TDAP  04/23/2019  . COLONOSCOPY  09/05/2024  . INFLUENZA VACCINE  Completed  . Hepatitis C Screening  Completed  . HIV Screening  Completed    ----------------------------------------------------------------------------------------------------------------------------------------------------------------------------------------------------------------- Physical Exam  BP 130/70   Pulse 80   Temp 98.2 F (36.8 C)   Wt 208 lb 6.4 oz (94.5 kg)   SpO2 97%   BMI 26.05 kg/m   Physical Exam  Constitutional: He appears well-nourished. No distress.  HENT:  Head: Normocephalic and atraumatic.  Mouth/Throat: Oropharynx is clear and moist.  Eyes: No scleral icterus.  Neck: Neck supple. No thyromegaly present.  Cardiovascular: Normal rate, regular rhythm and normal heart sounds.  Pulmonary/Chest: Effort normal and breath sounds normal.  Lymphadenopathy:    He has no cervical adenopathy.  Neurological: He is alert.  Skin: Skin is warm and dry.  Psychiatric: He has a normal mood and affect. His behavior is normal.     ------------------------------------------------------------------------------------------------------------------------------------------------------------------------------------------------------------------- Assessment and Plan  Pharyngitis Meds ordered this encounter  Medications  . amoxicillin (AMOXIL) 500 MG capsule    Sig: Take 1 capsule (500 mg total) by mouth 3 (three) times daily for 10 days.    Dispense:  30 capsule    Refill:  0  Will cover empirically given strep exposure Push fluids May continue OTC medications as needed

## 2018-02-23 NOTE — Patient Instructions (Signed)
-  Start amoxicillin -You may use OTC medications such as mucinex or cough medication as well.  -Continue to push fluids and use humidifier -If symptoms worsen please let me know

## 2018-02-23 NOTE — Assessment & Plan Note (Signed)
Meds ordered this encounter  Medications  . amoxicillin (AMOXIL) 500 MG capsule    Sig: Take 1 capsule (500 mg total) by mouth 3 (three) times daily for 10 days.    Dispense:  30 capsule    Refill:  0  Will cover empirically given strep exposure Push fluids May continue OTC medications as needed

## 2018-02-27 ENCOUNTER — Other Ambulatory Visit: Payer: Self-pay | Admitting: Family Medicine

## 2018-02-27 DIAGNOSIS — L814 Other melanin hyperpigmentation: Secondary | ICD-10-CM | POA: Diagnosis not present

## 2018-02-27 DIAGNOSIS — L821 Other seborrheic keratosis: Secondary | ICD-10-CM | POA: Diagnosis not present

## 2018-02-27 DIAGNOSIS — Z86018 Personal history of other benign neoplasm: Secondary | ICD-10-CM | POA: Diagnosis not present

## 2018-02-27 DIAGNOSIS — D225 Melanocytic nevi of trunk: Secondary | ICD-10-CM | POA: Diagnosis not present

## 2018-02-27 DIAGNOSIS — I10 Essential (primary) hypertension: Secondary | ICD-10-CM

## 2018-02-27 DIAGNOSIS — Z23 Encounter for immunization: Secondary | ICD-10-CM | POA: Diagnosis not present

## 2018-02-27 MED FILL — LISINOPRIL 20 MG TABLET: 20 | 90 days supply | Qty: 90 | Fill #0

## 2018-03-09 NOTE — Progress Notes (Signed)
Stephen [name], [place] [address] Phone: [phone] Subjective:    I Stephen [name] am serving as a Education administrator for Dr. Hulan Saas.   CC: Neck pain follow-up  HWE:[id number]  Stephen [name]. is a 57 y.o. male coming in with complaint of right hand pain. States the hand is doing well.  Patient states 90% better.  Patient continues to have some mild neck and back pain.  Has responded well to manipulation.  No longer have any of the radicular symptoms going down the arm.     Past Medical History:  Diagnosis Date  . Colon polyps   . Detached retina, right 1992   Dr. [name] at [facility] did orbital band procedure with good results.   . High cholesterol   . Hypertension   . Poison ivy dermatitis 07/29/2015   Past Surgical History:  Procedure Laterality Date  . RETINAL DETACHMENT SURGERY     Social History   Socioeconomic History  . Marital status: Married    Spouse name: Not on file  . Number of children: 2  . Years of education: 73  . Highest education level: Not on file  Occupational History  . Occupation: busines    Employer: [employer]  Social Needs  . Financial resource strain: Not on file  . Food insecurity:    Worry: Not on file    Inability: Not on file  . Transportation needs:    Medical: Not on file    Non-medical: Not on file  Tobacco Use  . Smoking status: Never Smoker  . Smokeless tobacco: Never Used  Substance and Sexual Activity  . Alcohol use: Yes    Alcohol/week: 1.0 standard drinks    Types: 1 drink(s) per week  . Drug use: No  . Sexual activity: Yes    Partners: Female  Lifestyle  . Physical activity:    Days per week: Not on file    Minutes per session: Not on file  . Stress: Not on file  Relationships  . Social connections:    Talks on phone: Not on file    Gets together: Not on file    Attends religious service: Not on file    Active member of club or organization: Not on file    Attends  meetings of clubs or organizations: Not on file    Relationship status: Not on file  Other Topics Concern  . Not on file  Social History Narrative   HSG, [employer] - BS business admin. Married '84 - 1 son '86, 1 dtr -'64. 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 ACP discussed and he is referred to [email or web address].   No Known Allergies Family History  Problem Relation Age of Onset  . Parkinsonism Mother   . Cancer Father        colon cancer with mets  . Hyperlipidemia Father   . Hypertension Father   . Hypertension Brother   . Diabetes Neg Hx   . Heart disease Neg Hx      Current Outpatient Medications (Cardiovascular):  .  lisinopril (PRINIVIL,ZESTRIL) 20 MG tablet, TAKE 1 TABLET (20 MG TOTAL) BY MOUTH DAILY. .  pravastatin (PRAVACHOL) 40 MG tablet, TAKE 1 TABLET BY MOUTH AT BEDTIME.     Current Outpatient Medications (Other):  .  fish oil-omega-3 fatty acids 1000 MG capsule, Take 2 g by mouth daily.   .  Multiple Vitamin (MULTIVITAMIN)  tablet, Take 1 tablet by mouth daily.   Marland Kitchen  venlafaxine XR (EFFEXOR XR) 75 MG 24 hr capsule, Take 1 capsule (75 mg total) by mouth daily with breakfast.    Past medical history, social, surgical and family history all reviewed in electronic medical record.  No pertanent information unless stated regarding to the chief complaint.   Review of Systems:  No headache, visual changes, nausea, vomiting, diarrhea, constipation, dizziness, abdominal pain, skin rash, fevers, chills, night sweats, weight loss, swollen lymph nodes, body aches, joint swelling, muscle aches, chest pain, shortness of breath, mood changes.   Objective  Blood pressure 128/72, pulse 69, height 6\' 3"  (1.905 m), weight 210 lb (95.3 kg), SpO2 97 %.    General: No apparent distress alert and oriented x3 mood and affect normal, dressed appropriately.  HEENT: Pupils equal, extraocular movements intact    Respiratory: Patient's speak in full sentences and does not appear short of breath  Cardiovascular: No lower extremity edema, non tender, no erythema  Skin: Warm dry intact with no signs of infection or rash on extremities or on axial skeleton.  Abdomen: Soft nontender  Neuro: Cranial nerves II through XII are intact, neurovascularly intact in all extremities with 2+ DTRs and 2+ pulses.  Lymph: No lymphadenopathy of posterior or anterior cervical chain or axillae bilaterally.  Gait normal with good balance and coordination.  MSK:  Non tender with full range of motion and good stability and symmetric strength and tone of shoulders, elbows, wrist, hip, knee and ankles bilaterally.  Neck: Inspection loss of lordosis. No palpable stepoffs. Negative Spurling's maneuver.  Mild limited range of motion especially with the sidebending bilaterally right greater than left Grip strength and sensation normal in bilateral hands Strength good C4 to T1 distribution No sensory change to C4 to T1 Negative Hoffman sign bilaterally Reflexes normal Increased tightness of the right trapezius  Osteopathic findings C2 flexed rotated and side bent right C7 flexed rotated and side bent left T8 extended rotated and side bent left L4 flexed rotated and side bent right Sacrum right on right    Impression and Recommendations:     This case required medical decision making of moderate complexity. The above documentation has been reviewed and is accurate and complete Lyndal Pulley, DO       Note: This dictation was prepared with Dragon dictation along with smaller phrase technology. Any transcriptional errors that result from this process are unintentional.

## 2018-03-10 ENCOUNTER — Encounter: Payer: Self-pay | Admitting: Family Medicine

## 2018-03-10 ENCOUNTER — Ambulatory Visit (INDEPENDENT_AMBULATORY_CARE_PROVIDER_SITE_OTHER): Payer: 59 | Admitting: Family Medicine

## 2018-03-10 VITALS — BP 128/72 | HR 69 | Ht 75.0 in | Wt 210.0 lb

## 2018-03-10 DIAGNOSIS — M999 Biomechanical lesion, unspecified: Secondary | ICD-10-CM | POA: Diagnosis not present

## 2018-03-10 DIAGNOSIS — M5412 Radiculopathy, cervical region: Secondary | ICD-10-CM | POA: Diagnosis not present

## 2018-03-10 NOTE — Assessment & Plan Note (Signed)
Decision today to treat with OMT was based on Physical Exam  After verbal consent patient was treated with HVLA, ME, FPR techniques in cervical, thoracic, lumbar and sacral areas  Patient tolerated the procedure well with improvement in symptoms  Patient given exercises, stretches and lifestyle modifications  See medications in patient instructions if given  Patient will follow up in 6-8 weeks 

## 2018-03-10 NOTE — Patient Instructions (Signed)
Great to see you  Stephen [name] is yoru friend Stay active Posture is key  Try to change work standing/sitting every 2 hours or so  See me again in 6-7 weeks

## 2018-03-10 NOTE — Assessment & Plan Note (Signed)
No longer have any radicular symptoms.  Mild degenerative disc disease noted.  Discussed which activities to do which wants to avoid.  Increase activity as tolerated.  Discussed icing regimen and home exercises.  Follow-up again in 6 to 8 weeks

## 2018-03-14 ENCOUNTER — Encounter: Payer: Self-pay | Admitting: Family Medicine

## 2018-03-14 ENCOUNTER — Ambulatory Visit: Payer: 59 | Admitting: Family Medicine

## 2018-03-14 VITALS — BP 126/80 | HR 67 | Temp 97.9°F | Ht 75.0 in | Wt 210.0 lb

## 2018-03-14 DIAGNOSIS — J029 Acute pharyngitis, unspecified: Secondary | ICD-10-CM | POA: Diagnosis not present

## 2018-03-14 LAB — POCT RAPID STREP A (OFFICE): Rapid Strep A Screen: NEGATIVE

## 2018-03-14 NOTE — Patient Instructions (Signed)

## 2018-03-14 NOTE — Progress Notes (Signed)
Established Patient Office Visit  Subjective:  Patient ID: Stephen [name]., male    DOB: [birth year]  Age: 57 y.o. MRN: [id number]  CC:  Chief Complaint  Patient presents with  . Sore Throat    HPI [name] presents for evaluation of a sore throat since Saturday. There has been no fever.  Patient has a slight headache denies nausea vomiting or change in his appetite.  There is been no fever or chills.  There is no cough.  Denies facial pressure teeth pain or rhinorrhea.  Past Medical History:  Diagnosis Date  . Colon polyps   . Detached retina, right 1992   Dr. [name] at [facility] did orbital band procedure with good results.   . High cholesterol   . Hypertension   . Poison ivy dermatitis 07/29/2015    Past Surgical History:  Procedure Laterality Date  . RETINAL DETACHMENT SURGERY      Family History  Problem Relation Age of Onset  . Parkinsonism Mother   . Cancer Father        colon cancer with mets  . Hyperlipidemia Father   . Hypertension Father   . Hypertension Brother   . Diabetes Neg Hx   . Heart disease Neg Hx     Social History   Socioeconomic History  . Marital status: Married    Spouse name: Not on file  . Number of children: 2  . Years of education: 31  . Highest education level: Not on file  Occupational History  . Occupation: busines    Employer: [employer]  Social Needs  . Financial resource strain: Not on file  . Food insecurity:    Worry: Not on file    Inability: Not on file  . Transportation needs:    Medical: Not on file    Non-medical: Not on file  Tobacco Use  . Smoking status: Never Smoker  . Smokeless tobacco: Never Used  Substance and Sexual Activity  . Alcohol use: Yes    Alcohol/week: 1.0 standard drinks    Types: 1 drink(s) per week  . Drug use: No  . Sexual activity: Yes    Partners: Female  Lifestyle  . Physical activity:    Days per week: Not on file    Minutes per session: Not on file  . Stress: Not on file   Relationships  . Social connections:    Talks on phone: Not on file    Gets together: Not on file    Attends religious service: Not on file    Active member of club or organization: Not on file    Attends meetings of clubs or organizations: Not on file    Relationship status: Not on file  . Intimate partner violence:    Fear of current or ex partner: Not on file    Emotionally abused: Not on file    Physically abused: Not on file    Forced sexual activity: Not on file  Other Topics Concern  . Not on file  Social History Narrative   HSG, [employer] - BS business admin. Married '84 - 1 son '86, 1 dtr -'19. 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 ACP discussed and he is referred to [email or web address].    Outpatient Medications Prior to Visit  Medication Sig Dispense Refill  . fish oil-omega-3 fatty acids 1000 MG capsule Take 2 g by mouth daily.      Marland Kitchen lisinopril (  PRINIVIL,ZESTRIL) 20 MG tablet TAKE 1 TABLET (20 MG TOTAL) BY MOUTH DAILY. 90 tablet 1  . Multiple Vitamin (MULTIVITAMIN) tablet Take 1 tablet by mouth daily.      . pravastatin (PRAVACHOL) 40 MG tablet TAKE 1 TABLET BY MOUTH AT BEDTIME. 90 tablet 1  . venlafaxine XR (EFFEXOR XR) 75 MG 24 hr capsule Take 1 capsule (75 mg total) by mouth daily with breakfast. 90 capsule 3   No facility-administered medications prior to visit.     No Known Allergies  ROS Review of Systems  Constitutional: Negative for chills, diaphoresis, fatigue, fever and unexpected weight change.  HENT: Positive for congestion and sore throat. Negative for postnasal drip, rhinorrhea, sinus pressure, sinus pain, trouble swallowing and voice change.   Eyes: Negative for photophobia and visual disturbance.  Respiratory: Negative.   Cardiovascular: Negative.   Gastrointestinal: Negative.   Musculoskeletal: Negative for arthralgias and myalgias.  Skin: Negative for pallor and  rash.  Neurological: Positive for headaches. Negative for seizures and numbness.  Hematological: Does not bruise/bleed easily.  Psychiatric/Behavioral: Negative.       Objective:    Physical Exam  Constitutional: He is oriented to person, place, and time. He appears well-developed and well-nourished. No distress.  HENT:  Head: Normocephalic and atraumatic.  Right Ear: External ear normal.  Left Ear: External ear normal.  Mouth/Throat: Uvula is midline and mucous membranes are normal. Posterior oropharyngeal erythema present. No oropharyngeal exudate, posterior oropharyngeal edema or tonsillar abscesses.  Eyes: Pupils are equal, round, and reactive to light. Right eye exhibits no discharge. Left eye exhibits no discharge.  Neck: Neck supple. No JVD present. No tracheal tenderness present. No tracheal deviation present. No thyromegaly present.  Cardiovascular: Normal rate, regular rhythm and normal heart sounds.  Pulmonary/Chest: Effort normal and breath sounds normal. No stridor.  Lymphadenopathy:    He has no cervical adenopathy.  Neurological: He is alert and oriented to person, place, and time.  Skin: Skin is warm and dry. He is not diaphoretic.  Psychiatric: He has a normal mood and affect. His behavior is normal.    BP 126/80   Pulse 67   Temp 97.9 F (36.6 C) (Oral)   Ht 6\' 3"  (1.905 m)   Wt 210 lb (95.3 kg)   SpO2 98%   BMI 26.25 kg/m  Wt Readings from Last 3 Encounters:  03/14/18 210 lb (95.3 kg)  03/10/18 210 lb (95.3 kg)  02/23/18 208 lb 6.4 oz (94.5 kg)   BP Readings from Last 3 Encounters:  03/14/18 126/80  03/10/18 128/72  02/23/18 130/70   Guideline developer:  UpToDate (see UpToDate for funding source) Date Released: June 2014  There are no preventive care reminders to display for this patient.  There are no preventive care reminders to display for this patient.  Lab Results  Component Value Date   TSH 0.69 02/15/2017   Lab Results  Component  Value Date   WBC 7.4 09/02/2017   HGB 14.4 09/02/2017   HCT 41.6 09/02/2017   MCV 90.4 09/02/2017   PLT 182 09/02/2017   Lab Results  Component Value Date   NA 144 02/15/2017   K 4.3 02/15/2017   CO2 31 02/15/2017   GLUCOSE 101 (H) 02/15/2017   BUN 22 02/15/2017   CREATININE 1.01 02/15/2017   BILITOT 0.8 02/15/2017   ALKPHOS 58 02/15/2017   AST 23 02/15/2017   ALT 36 02/15/2017   PROT 6.8 02/15/2017   ALBUMIN 4.7 02/15/2017   CALCIUM 9.6  02/15/2017   GFR 81.08 02/15/2017   Lab Results  Component Value Date   CHOL 151 02/15/2017   Lab Results  Component Value Date   HDL 48.80 02/15/2017   Lab Results  Component Value Date   LDLCALC 87 02/15/2017   Lab Results  Component Value Date   TRIG 75.0 02/15/2017   Lab Results  Component Value Date   CHOLHDL 3 02/15/2017   No results found for: HGBA1C    Assessment & Plan:   Problem List Items Addressed This Visit      Respiratory   Pharyngitis - Primary   Relevant Orders   POCT rapid strep A (Completed)      No orders of the defined types were placed in this encounter.   Follow-up: Return if symptoms worsen or fail to improve.   Anticipatory guidance was given for sore throat care eluding salt water gargles Tylenol Advil and the light.  Patient will let me know if he is not improved by the end of the week.

## 2018-04-21 ENCOUNTER — Other Ambulatory Visit: Payer: Self-pay | Admitting: Family Medicine

## 2018-04-21 DIAGNOSIS — E782 Mixed hyperlipidemia: Secondary | ICD-10-CM

## 2018-04-21 MED FILL — VENLAFAXINE HCL ER 37.5 MG: 37.5 | 90 days supply | Qty: 180 | Fill #1

## 2018-04-21 MED FILL — PRAVASTATIN NA 40 MG TAB: 40 | 90 days supply | Qty: 90 | Fill #0

## 2018-04-24 NOTE — Progress Notes (Signed)
[name] [address] [place], [place] [address] Phone: [phone] Subjective:   Fontaine No, am serving as a scribe for Dr. Hulan Saas.  CC: Back pain  STM:[id number]    03/11/2019: No longer have any radicular symptoms.  Mild degenerative disc disease noted.  Discussed which activities to do which wants to avoid.  Increase activity as tolerated.  Discussed icing regimen and home exercises.  Follow-up again in 6 to 8 weeks  Update 04/25/2018: Stephen [name]. is a 58 y.o. male coming in with complaint of back pain. Patient feels that he is doing a lot better than before he started OMT.  Patient states doing relatively well overall.  No radicular symptoms.       Past Medical History:  Diagnosis Date  . Colon polyps   . Detached retina, right 1992   Dr. [name] at [facility] did orbital band procedure with good results.   . High cholesterol   . Hypertension   . Poison ivy dermatitis 07/29/2015   Past Surgical History:  Procedure Laterality Date  . RETINAL DETACHMENT SURGERY     Social History   Socioeconomic History  . Marital status: Married    Spouse name: Not on file  . Number of children: 2  . Years of education: 22  . Highest education level: Not on file  Occupational History  . Occupation: busines    Employer: [employer]  Social Needs  . Financial resource strain: Not on file  . Food insecurity:    Worry: Not on file    Inability: Not on file  . Transportation needs:    Medical: Not on file    Non-medical: Not on file  Tobacco Use  . Smoking status: Never Smoker  . Smokeless tobacco: Never Used  Substance and Sexual Activity  . Alcohol use: Yes    Alcohol/week: 1.0 standard drinks    Types: 1 drink(s) per week  . Drug use: No  . Sexual activity: Yes    Partners: Female  Lifestyle  . Physical activity:    Days per week: Not on file    Minutes per session: Not on file  . Stress: Not on file  Relationships  .  Social connections:    Talks on phone: Not on file    Gets together: Not on file    Attends religious service: Not on file    Active member of club or organization: Not on file    Attends meetings of clubs or organizations: Not on file    Relationship status: Not on file  Other Topics Concern  . Not on file  Social History Narrative   HSG, [employer] - BS business admin. Married '84 - 1 son '86, 1 dtr -'61. 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 ACP discussed and he is referred to [email or web address].   No Known Allergies Family History  Problem Relation Age of Onset  . Parkinsonism Mother   . Cancer Father        colon cancer with mets  . Hyperlipidemia Father   . Hypertension Father   . Hypertension Brother   . Diabetes Neg Hx   . Heart disease Neg Hx      Current Outpatient Medications (Cardiovascular):  .  lisinopril (PRINIVIL,ZESTRIL) 20 MG tablet, TAKE 1 TABLET (20 MG TOTAL) BY MOUTH DAILY. .  pravastatin (PRAVACHOL) 40 MG tablet, TAKE 1 TABLET BY MOUTH AT BEDTIME.  Current Outpatient Medications (Other):  .  fish oil-omega-3 fatty acids 1000 MG capsule, Take 2 g by mouth daily.   .  Multiple Vitamin (MULTIVITAMIN) tablet, Take 1 tablet by mouth daily.   Marland Kitchen  venlafaxine XR (EFFEXOR XR) 75 MG 24 hr capsule, Take 1 capsule (75 mg total) by mouth daily with breakfast.    Past medical history, social, surgical and family history all reviewed in electronic medical record.  No pertanent information unless stated regarding to the chief complaint.   Review of Systems:  No headache, visual changes, nausea, vomiting, diarrhea, constipation, dizziness, abdominal pain, skin rash, fevers, chills, night sweats, weight loss, swollen lymph nodes, body aches, joint swelling, muscle aches, chest pain, shortness of breath, mood changes.   Objective  There were no vitals taken for this visit. Systems  examined below as of    General: No apparent distress alert and oriented x3 mood and affect normal, dressed appropriately.  HEENT: Pupils equal, extraocular movements intact  Respiratory: Patient's speak in full sentences and does not appear short of breath  Cardiovascular: No lower extremity edema, non tender, no erythema  Skin: Warm dry intact with no signs of infection or rash on extremities or on axial skeleton.  Abdomen: Soft nontender  Neuro: Cranial nerves II through XII are intact, neurovascularly intact in all extremities with 2+ DTRs and 2+ pulses.  Lymph: No lymphadenopathy of posterior or anterior cervical chain or axillae bilaterally.  Gait normal with good balance and coordination.  MSK:  Non tender with full range of motion and good stability and symmetric strength and tone of shoulders, elbows, wrist, hip, knee and ankles bilaterally.  Neck: Inspection loss of lordosis. No palpable stepoffs. Negative Spurling's maneuver. Full neck range of motion Grip strength and sensation normal in bilateral hands Strength good C4 to T1 distribution No sensory change to C4 to T1 Negative Hoffman sign bilaterally Reflexes normal Tightness of the trapezius bilaterally right greater than left  Back exam does have some loss of lordosis as well.  Tender over the right sacroiliac joint.  Mild positive Corky Sox.  Negative straight leg test.  Still has tightness of the hamstrings.  Neurovascular intact distally.  Osteopathic findings C6 flexed rotated and side bent left T3 extended rotated and side bent right inhaled third rib T9 extended rotated and side bent left L2 flexed rotated and side bent right Sacrum right on right     Impression and Recommendations:     This case required medical decision making of moderate complexity. The above documentation has been reviewed and is accurate and complete Lyndal Pulley, DO       Note: This dictation was prepared with Dragon dictation  along with smaller phrase technology. Any transcriptional errors that result from this process are unintentional.

## 2018-04-25 ENCOUNTER — Ambulatory Visit (INDEPENDENT_AMBULATORY_CARE_PROVIDER_SITE_OTHER): Payer: 59 | Admitting: Family Medicine

## 2018-04-25 ENCOUNTER — Encounter: Payer: Self-pay | Admitting: Family Medicine

## 2018-04-25 VITALS — BP 102/62 | HR 65 | Ht 75.0 in | Wt 209.0 lb

## 2018-04-25 DIAGNOSIS — M5412 Radiculopathy, cervical region: Secondary | ICD-10-CM | POA: Diagnosis not present

## 2018-04-25 DIAGNOSIS — M999 Biomechanical lesion, unspecified: Secondary | ICD-10-CM | POA: Diagnosis not present

## 2018-04-25 NOTE — Assessment & Plan Note (Signed)
Decision today to treat with OMT was based on Physical Exam  After verbal consent patient was treated with HVLA, ME, FPR techniques in cervical, thoracic, lumbar and sacral areas  Patient tolerated the procedure well with improvement in symptoms  Patient given exercises, stretches and lifestyle modifications  See medications in patient instructions if given  Patient will follow up in 4-8 weeks 

## 2018-04-25 NOTE — Assessment & Plan Note (Signed)
No longer having any radicular symptoms.  Patient is no longer taking the gabapentin at the moment.  Patient has been trying to be active.  Continues with the Effexor.  Follow-up with me again in 4 to 8 weeks.

## 2018-04-25 NOTE — Patient Instructions (Signed)
Good to see you  You are doing great  Remember to stretch after working out.  See me again in 6-7 weeks

## 2018-05-22 MED FILL — LISINOPRIL 20 MG TABLET: 20 | 90 days supply | Qty: 90 | Fill #1

## 2018-06-13 ENCOUNTER — Other Ambulatory Visit: Payer: Self-pay

## 2018-06-13 ENCOUNTER — Ambulatory Visit (INDEPENDENT_AMBULATORY_CARE_PROVIDER_SITE_OTHER): Payer: 59 | Admitting: Family Medicine

## 2018-06-13 ENCOUNTER — Encounter: Payer: Self-pay | Admitting: Family Medicine

## 2018-06-13 VITALS — BP 132/90 | HR 79 | Ht 75.0 in | Wt 205.0 lb

## 2018-06-13 DIAGNOSIS — M999 Biomechanical lesion, unspecified: Secondary | ICD-10-CM | POA: Diagnosis not present

## 2018-06-13 DIAGNOSIS — M5412 Radiculopathy, cervical region: Secondary | ICD-10-CM | POA: Diagnosis not present

## 2018-06-13 NOTE — Patient Instructions (Signed)
Stay safe  See me again in 27months

## 2018-06-13 NOTE — Progress Notes (Signed)
[name] [place], [place] [address] Phone: [phone] Subjective:   I Stephen [name] am serving as a Education administrator for Dr. Hulan Saas.   CC: Back and neck pain follow-up  [id number]  Stephen [name]. is a 58 y.o. male coming in with complaint of back pain. States that he is doing well.  Patient states that he has been feeling relatively well.  Trying to decrease stress as much as possible.  Patient has been working on Engineer, building services.  Patient states though that sometimes difficult to do the exercises on a regular basis.      Past Medical History:  Diagnosis Date  . Colon polyps   . Detached retina, right 1992   Dr. [name] at [facility] did orbital band procedure with good results.   . High cholesterol   . Hypertension   . Poison ivy dermatitis 07/29/2015   Past Surgical History:  Procedure Laterality Date  . RETINAL DETACHMENT SURGERY     Social History   Socioeconomic History  . Marital status: Married    Spouse name: Not on file  . Number of children: 2  . Years of education: 45  . Highest education level: Not on file  Occupational History  . Occupation: busines    Employer: [employer]  Social Needs  . Financial resource strain: Not on file  . Food insecurity:    Worry: Not on file    Inability: Not on file  . Transportation needs:    Medical: Not on file    Non-medical: Not on file  Tobacco Use  . Smoking status: Never Smoker  . Smokeless tobacco: Never Used  Substance and Sexual Activity  . Alcohol use: Yes    Alcohol/week: 1.0 standard drinks    Types: 1 drink(s) per week  . Drug use: No  . Sexual activity: Yes    Partners: Female  Lifestyle  . Physical activity:    Days per week: Not on file    Minutes per session: Not on file  . Stress: Not on file  Relationships  . Social connections:    Talks on phone: Not on file    Gets together: Not on file    Attends religious service: Not on file   Active member of club or organization: Not on file    Attends meetings of clubs or organizations: Not on file    Relationship status: Not on file  Other Topics Concern  . Not on file  Social History Narrative   HSG, [employer] - BS business admin. Married '84 - 1 son '86, 1 dtr -'76. 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 ACP discussed and he is referred to [email or web address].   No Known Allergies Family History  Problem Relation Age of Onset  . Parkinsonism Mother   . Cancer Father        colon cancer with mets  . Hyperlipidemia Father   . Hypertension Father   . Hypertension Brother   . Diabetes Neg Hx   . Heart disease Neg Hx      Current Outpatient Medications (Cardiovascular):  .  lisinopril (PRINIVIL,ZESTRIL) 20 MG tablet, TAKE 1 TABLET (20 MG TOTAL) BY MOUTH DAILY. .  pravastatin (PRAVACHOL) 40 MG tablet, TAKE 1 TABLET BY MOUTH AT BEDTIME.     Current Outpatient Medications (Other):  .  fish oil-omega-3 fatty acids 1000 MG capsule, Take 2 g by mouth daily.   Marland Kitchen  Multiple Vitamin (MULTIVITAMIN) tablet, Take 1 tablet by mouth daily.   Marland Kitchen  venlafaxine XR (EFFEXOR XR) 75 MG 24 hr capsule, Take 1 capsule (75 mg total) by mouth daily with breakfast.    Past medical history, social, surgical and family history all reviewed in electronic medical record.  No pertanent information unless stated regarding to the chief complaint.   Review of Systems:  No headache, visual changes, nausea, vomiting, diarrhea, constipation, dizziness, abdominal pain, skin rash, fevers, chills, night sweats, weight loss, swollen lymph nodes, body aches, joint swelling,  chest pain, shortness of breath, mood changes.  Positive muscle aches  Objective  Blood pressure 132/90, pulse 79, height 6\' 3"  (1.905 m), weight 205 lb (93 kg), SpO2 97 %.    General: No apparent distress alert and oriented x3 mood and affect normal,  dressed appropriately.  HEENT: Pupils equal, extraocular movements intact  Respiratory: Patient's speak in full sentences and does not appear short of breath  Cardiovascular: No lower extremity edema, non tender, no erythema  Skin: Warm dry intact with no signs of infection or rash on extremities or on axial skeleton.  Abdomen: Soft nontender  Neuro: Cranial nerves II through XII are intact, neurovascularly intact in all extremities with 2+ DTRs and 2+ pulses.  Lymph: No lymphadenopathy of posterior or anterior cervical chain or axillae bilaterally.  Gait normal with good balance and coordination.  MSK:  Non tender with full range of motion and good stability and symmetric strength and tone of shoulders, elbows, wrist, hip, knee and ankles bilaterally.  Neck: Inspection loss of lordosis. No palpable stepoffs. Negative Spurling's maneuver. Patient does have some mild limitation in range of motion lacking of 5 degrees of extension. Grip strength and sensation normal in bilateral hands Strength good C4 to T1 distribution No sensory change to C4 to T1 Negative Hoffman sign bilaterally Reflexes normal Tightness of the trapezius bilaterally  Osteopathic findings C2 flexed rotated and side bent right C7 flexed rotated and side bent left T5 extended rotated and side bent left L2 flexed rotated and side bent right Sacrum right on right    Impression and Recommendations:     This case required medical decision making of moderate complexity. The above documentation has been reviewed and is accurate and complete Lyndal Pulley, DO       Note: This dictation was prepared with Dragon dictation along with smaller phrase technology. Any transcriptional errors that result from this process are unintentional.

## 2018-06-13 NOTE — Assessment & Plan Note (Signed)
Decision today to treat with OMT was based on Physical Exam  After verbal consent patient was treated with HVLA, ME, FPR techniques in cervical, thoracic, lumbar and sacral areas  Patient tolerated the procedure well with improvement in symptoms  Patient given exercises, stretches and lifestyle modifications  See medications in patient instructions if given  Patient will follow up in 6-8 weeks 

## 2018-06-13 NOTE — Assessment & Plan Note (Signed)
No radicular symptoms at this time.  Continue the Effexor, discussed icing regimen and home exercise.  Discussed the posture.  Due to the current outbreak though will avoid office visits for 7 to 8 weeks.  Patient knows if worsening symptoms can call but avoiding manipulation from now.

## 2018-07-24 ENCOUNTER — Other Ambulatory Visit: Payer: Self-pay | Admitting: Family Medicine

## 2018-07-24 DIAGNOSIS — I10 Essential (primary) hypertension: Secondary | ICD-10-CM

## 2018-07-24 MED FILL — PRAVASTATIN NA 40 MG TAB: 40 | 90 days supply | Qty: 90 | Fill #1

## 2018-08-14 NOTE — Progress Notes (Signed)
[name] [place], [place] [address] Phone: [phone] Subjective:   I Stephen [name] am serving as a Education administrator for Dr. Hulan Saas.  I'm seeing this patient by the request  of:    CC: Back pain follow-up  [id number]  Stephen [name]. is a 58 y.o. male coming in with complaint of back pain. States that he is doing well.  Patient has been doing well.  Some mild neck pain with some mild radicular symptoms previously but seems to be doing better.  Still gets fatigued in the arm with a lot of painting which she did recently.      Past Medical History:  Diagnosis Date  . Colon polyps   . Detached retina, right 1992   Dr. [name] at [facility] did orbital band procedure with good results.   . High cholesterol   . Hypertension   . Poison ivy dermatitis 07/29/2015   Past Surgical History:  Procedure Laterality Date  . RETINAL DETACHMENT SURGERY     Social History   Socioeconomic History  . Marital status: Married    Spouse name: Not on file  . Number of children: 2  . Years of education: 67  . Highest education level: Not on file  Occupational History  . Occupation: busines    Employer: [employer]  Social Needs  . Financial resource strain: Not on file  . Food insecurity:    Worry: Not on file    Inability: Not on file  . Transportation needs:    Medical: Not on file    Non-medical: Not on file  Tobacco Use  . Smoking status: Never Smoker  . Smokeless tobacco: Never Used  Substance and Sexual Activity  . Alcohol use: Yes    Alcohol/week: 1.0 standard drinks    Types: 1 drink(s) per week  . Drug use: No  . Sexual activity: Yes    Partners: Female  Lifestyle  . Physical activity:    Days per week: Not on file    Minutes per session: Not on file  . Stress: Not on file  Relationships  . Social connections:    Talks on phone: Not on file    Gets together: Not on file    Attends religious service: Not on file    Active  member of club or organization: Not on file    Attends meetings of clubs or organizations: Not on file    Relationship status: Not on file  Other Topics Concern  . Not on file  Social History Narrative   HSG, [employer] - BS business admin. Married '84 - 1 son '86, 1 dtr -'29. 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 ACP discussed and he is referred to [email or web address].   No Known Allergies Family History  Problem Relation Age of Onset  . Parkinsonism Mother   . Cancer Father        colon cancer with mets  . Hyperlipidemia Father   . Hypertension Father   . Hypertension Brother   . Diabetes Neg Hx   . Heart disease Neg Hx      Current Outpatient Medications (Cardiovascular):  .  lisinopril (ZESTRIL) 20 MG tablet, TAKE 1 TABLET BY MOUTH ONCE DAILY .  pravastatin (PRAVACHOL) 40 MG tablet, TAKE 1 TABLET BY MOUTH AT BEDTIME.     Current Outpatient Medications (Other):  .  fish oil-omega-3 fatty acids 1000 MG capsule, Take  2 g by mouth daily.   .  Multiple Vitamin (MULTIVITAMIN) tablet, Take 1 tablet by mouth daily.   Marland Kitchen  venlafaxine XR (EFFEXOR XR) 75 MG 24 hr capsule, Take 1 capsule (75 mg total) by mouth daily with breakfast. .  doxycycline (VIBRA-TABS) 100 MG tablet, Take 1 tablet (100 mg total) by mouth 2 (two) times daily for 21 days.    Past medical history, social, surgical and family history all reviewed in electronic medical record.  No pertanent information unless stated regarding to the chief complaint.   Review of Systems:  No headache, visual changes, nausea, vomiting, diarrhea, constipation, dizziness, abdominal pain, skin rash, fevers, chills, night sweats, weight loss, swollen lymph nodes, body aches, joint swelling, chest pain, shortness of breath, mood changes.  Positive muscle aches  Objective  Blood pressure 130/72, pulse 72, height 6\' 3"  (1.905 m), weight 91.2 kg, SpO2 98 %.     General: No apparent distress alert and oriented x3 mood and affect normal, dressed appropriately.  HEENT: Pupils equal, extraocular movements intact  Respiratory: Patient's speak in full sentences and does not appear short of breath  Cardiovascular: No lower extremity edema, non tender, no erythema  Skin: Warm dry intact with no signs of infection or rash on extremities or on axial skeleton.  Abdomen: Soft nontender  Neuro: Cranial nerves II through XII are intact, neurovascularly intact in all extremities with 2+ DTRs and 2+ pulses.  Lymph: No lymphadenopathy of posterior or anterior cervical chain or axillae bilaterally.  Gait normal with good balance and coordination.  MSK:  Non tender with full range of motion and good stability and symmetric strength and tone of shoulders, elbows, wrist, hip, knee and ankles bilaterally.  Neck: Inspection loss of lordosis. No palpable stepoffs. Negative Spurling's maneuver. Full neck range of motion Grip strength and sensation normal in bilateral hands Strength good C4 to T1 distribution No sensory change to C4 to T1 Negative Hoffman sign bilaterally Reflexes normal  Tightness of the trapezius bilaterally  Osteopathic findings  C2 flexed rotated and side bent right C6 flexed rotated and side bent left T3 extended rotated and side bent right inhaled third rib T9 extended rotated and side bent left L4 flexed rotated and side bent right Sacrum right on right    Impression and Recommendations:     This case required medical decision making of moderate complexity. The above documentation has been reviewed and is accurate and complete Lyndal Pulley, DO       Note: This dictation was prepared with Dragon dictation along with smaller phrase technology. Any transcriptional errors that result from this process are unintentional.

## 2018-08-15 ENCOUNTER — Encounter: Payer: Self-pay | Admitting: Family Medicine

## 2018-08-15 ENCOUNTER — Other Ambulatory Visit: Payer: Self-pay

## 2018-08-15 ENCOUNTER — Ambulatory Visit (INDEPENDENT_AMBULATORY_CARE_PROVIDER_SITE_OTHER): Payer: 59 | Admitting: Family Medicine

## 2018-08-15 VITALS — BP 130/72 | HR 72 | Ht 75.0 in | Wt 201.0 lb

## 2018-08-15 DIAGNOSIS — M999 Biomechanical lesion, unspecified: Secondary | ICD-10-CM

## 2018-08-15 DIAGNOSIS — M5412 Radiculopathy, cervical region: Secondary | ICD-10-CM

## 2018-08-15 MED ORDER — DOXYCYCLINE HYCLATE 100 MG PO TABS: 100.0000 mg | ORAL_TABLET | Freq: Two times a day (BID) | ORAL | 0 refills | Status: AC

## 2018-08-15 NOTE — Assessment & Plan Note (Signed)
Decision today to treat with OMT was based on Physical Exam  After verbal consent patient was treated with HVLA, ME, FPR techniques in cervical, thoracic, lumbar and sacral areas  Patient tolerated the procedure well with improvement in symptoms  Patient given exercises, stretches and lifestyle modifications  See medications in patient instructions if given  Patient will follow up in 4-8 weeks 

## 2018-08-15 NOTE — Assessment & Plan Note (Signed)
Doing very well at this time.  Discussed posture and ergonomics.  Discussed which activities to do which was to avoid.  Patient is increase activity slowly over the course the next several days.  Patient will follow-up again in 4 to 8 weeks

## 2018-08-15 NOTE — Patient Instructions (Addendum)
Good to see you  Ice still can help  Stay active and watch position.  You are doing great  Sent in the doxycycline 2 times a day for 3 weeks if you get a rash  See you again in 6-7 weeks

## 2018-08-23 MED FILL — LISINOPRIL 20 MG TABLET: 20 | 90 days supply | Qty: 90 | Fill #0

## 2018-09-19 ENCOUNTER — Encounter: Payer: Self-pay | Admitting: Family Medicine

## 2018-09-19 ENCOUNTER — Ambulatory Visit (INDEPENDENT_AMBULATORY_CARE_PROVIDER_SITE_OTHER): Payer: 59 | Admitting: Family Medicine

## 2018-09-19 VITALS — BP 130/76 | HR 82 | Ht 75.0 in | Wt 203.0 lb

## 2018-09-19 DIAGNOSIS — E782 Mixed hyperlipidemia: Secondary | ICD-10-CM | POA: Diagnosis not present

## 2018-09-19 DIAGNOSIS — Z0000 Encounter for general adult medical examination without abnormal findings: Secondary | ICD-10-CM | POA: Diagnosis not present

## 2018-09-19 DIAGNOSIS — H6123 Impacted cerumen, bilateral: Secondary | ICD-10-CM

## 2018-09-19 DIAGNOSIS — I10 Essential (primary) hypertension: Secondary | ICD-10-CM | POA: Diagnosis not present

## 2018-09-19 NOTE — Progress Notes (Signed)
Established Patient Office Visit  Subjective:  Patient ID: Stephen Junkin., male    DOB: March 14, 1961  Age: 58 y.o. MRN: [id number]  CC:  Chief Complaint  Patient presents with  . Annual Exam    HPI Stephen [name]. presents for a complete physical.  He has been doing well.  Blood pressure has been controlled well with lisinopril and he is having no issues taking it.  Denies cough.  Taking Pravachol for his cholesterol that is also been well controlled.  Last LDL was 87.  Exercises some by walking.  Colonoscopies are every 5 years because of family history.  Seeing the eye doctor in August.  Dental care is pending.  Past Medical History:  Diagnosis Date  . Colon polyps   . Detached retina, right 1992   Dr. [name] at [facility] did orbital band procedure with good results.   . High cholesterol   . Hypertension   . Poison ivy dermatitis 07/29/2015    Past Surgical History:  Procedure Laterality Date  . RETINAL DETACHMENT SURGERY      Family History  Problem Relation Age of Onset  . Parkinsonism Mother   . Cancer Father        colon cancer with mets  . Hyperlipidemia Father   . Hypertension Father   . Hypertension Brother   . Diabetes Neg Hx   . Heart disease Neg Hx     Social History   Socioeconomic History  . Marital status: Married    Spouse name: Not on file  . Number of children: 2  . Years of education: 10  . Highest education level: Not on file  Occupational History  . Occupation: busines    Employer: [employer]  Social Needs  . Financial resource strain: Not on file  . Food insecurity    Worry: Not on file    Inability: Not on file  . Transportation needs    Medical: Not on file    Non-medical: Not on file  Tobacco Use  . Smoking status: Never Smoker  . Smokeless tobacco: Never Used  Substance and Sexual Activity  . Alcohol use: Yes    Alcohol/week: 1.0 standard drinks    Types: 1 drink(s) per week  . Drug use: No  . Sexual activity: Yes   Partners: Female  Lifestyle  . Physical activity    Days per week: Not on file    Minutes per session: Not on file  . Stress: Not on file  Relationships  . Social Herbalist on phone: Not on file    Gets together: Not on file    Attends religious service: Not on file    Active member of club or organization: Not on file    Attends meetings of clubs or organizations: Not on file    Relationship status: Not on file  . Intimate partner violence    Fear of current or ex partner: Not on file    Emotionally abused: Not on file    Physically abused: Not on file    Forced sexual activity: Not on file  Other Topics Concern  . Not on file  Social History Narrative   HSG, [employer] - BS business admin. Married '84 - 1 son '86, 1 dtr -'53. 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 ACP discussed and he is referred to [email or web address].    Outpatient Medications Prior to Visit  Medication Sig  Dispense Refill  . fish oil-omega-3 fatty acids 1000 MG capsule Take 2 g by mouth daily.      Marland Kitchen lisinopril (ZESTRIL) 20 MG tablet TAKE 1 TABLET BY MOUTH ONCE DAILY 90 tablet 0  . Multiple Vitamin (MULTIVITAMIN) tablet Take 1 tablet by mouth daily.      . pravastatin (PRAVACHOL) 40 MG tablet TAKE 1 TABLET BY MOUTH AT BEDTIME. 90 tablet 1  . venlafaxine XR (EFFEXOR XR) 75 MG 24 hr capsule Take 1 capsule (75 mg total) by mouth daily with breakfast. 90 capsule 3   No facility-administered medications prior to visit.     No Known Allergies  ROS Review of Systems  Constitutional: Negative.   HENT: Negative.   Eyes: Negative for photophobia and visual disturbance.  Respiratory: Negative.   Cardiovascular: Negative.   Gastrointestinal: Negative.   Endocrine: Negative for polyphagia and polyuria.  Genitourinary: Negative for difficulty urinating, frequency and urgency.  Musculoskeletal: Negative for gait problem and  joint swelling.  Skin: Negative for pallor and rash.  Allergic/Immunologic: Negative for immunocompromised state.  Neurological: Negative for light-headedness and headaches.  Hematological: Does not bruise/bleed easily.  Psychiatric/Behavioral: Negative.       Objective:    Physical Exam  Constitutional: He is oriented to person, place, and time. He appears well-developed and well-nourished. No distress.  HENT:  Head: Normocephalic and atraumatic.  Right Ear: External ear normal.  Left Ear: External ear normal.  Mouth/Throat: Oropharynx is clear and moist. No oropharyngeal exudate.  Eyes: Pupils are equal, round, and reactive to light. Conjunctivae are normal. Right eye exhibits no discharge. Left eye exhibits no discharge. No scleral icterus.  Neck: Neck supple. No JVD present. No tracheal deviation present. No thyromegaly present.  Cardiovascular: Normal rate, regular rhythm and normal heart sounds.  Pulmonary/Chest: Effort normal and breath sounds normal. No stridor.  Abdominal: Bowel sounds are normal. He exhibits no distension. There is no abdominal tenderness. There is no rebound and no guarding.  Genitourinary: Rectum:     Guaiac result negative.     No rectal mass, anal fissure, tenderness, external hemorrhoid, internal hemorrhoid or abnormal anal tone.  Prostate is not enlarged and not tender.  Musculoskeletal:        General: No edema.  Lymphadenopathy:    He has no cervical adenopathy.  Neurological: He is alert and oriented to person, place, and time.  Skin: Skin is warm and dry. He is not diaphoretic.  Psychiatric: He has a normal mood and affect. His behavior is normal.    BP 130/76   Pulse 82   Ht 6\' 3"  (1.905 m)   Wt 203 lb (92.1 kg)   SpO2 99%   BMI 25.37 kg/m  Wt Readings from Last 3 Encounters:  09/19/18 203 lb (92.1 kg)  08/15/18 201 lb (91.2 kg)  06/13/18 205 lb (93 kg)   BP Readings from Last 3 Encounters:  09/19/18 130/76  08/15/18 130/72   06/13/18 132/90   Guideline developer:  UpToDate (see UpToDate for funding source) Date Released: June 2014  There are no preventive care reminders to display for this patient.  There are no preventive care reminders to display for this patient.  Lab Results  Component Value Date   TSH 0.69 02/15/2017   Lab Results  Component Value Date   WBC 7.4 09/02/2017   HGB 14.4 09/02/2017   HCT 41.6 09/02/2017   MCV 90.4 09/02/2017   PLT 182 09/02/2017   Lab Results  Component Value Date  NA 144 02/15/2017   K 4.3 02/15/2017   CO2 31 02/15/2017   GLUCOSE 101 (H) 02/15/2017   BUN 22 02/15/2017   CREATININE 1.01 02/15/2017   BILITOT 0.8 02/15/2017   ALKPHOS 58 02/15/2017   AST 23 02/15/2017   ALT 36 02/15/2017   PROT 6.8 02/15/2017   ALBUMIN 4.7 02/15/2017   CALCIUM 9.6 02/15/2017   GFR 81.08 02/15/2017   Lab Results  Component Value Date   CHOL 151 02/15/2017   Lab Results  Component Value Date   HDL 48.80 02/15/2017   Lab Results  Component Value Date   LDLCALC 87 02/15/2017   Lab Results  Component Value Date   TRIG 75.0 02/15/2017   Lab Results  Component Value Date   CHOLHDL 3 02/15/2017   No results found for: HGBA1C    Assessment & Plan:   Problem List Items Addressed This Visit      Cardiovascular and Mediastinum   Essential hypertension   Relevant Orders   Comprehensive metabolic panel   Urinalysis, Routine w reflex microscopic     Nervous and Auditory   Excessive cerumen in both ear canals     Other   Mixed hyperlipidemia   Relevant Orders   Comprehensive metabolic panel   Lipid panel   Healthcare maintenance - Primary   Relevant Orders   CBC   PSA      No orders of the defined types were placed in this encounter.   Follow-up: Return return fasting for labs..   Patient given information on health maintenance and disease prevention.  Suggested that he use some Debrox eardrops and return for an ear lavage.  He will schedule and  make an appointment for fasting blood work as ordered above.

## 2018-09-19 NOTE — Patient Instructions (Signed)
Ear Drops, Adult You have been diagnosed with a condition that requires you to put drops of medicine into your ears. Ear drops are a medicine that is placed in the ear. This sheet gives you information about how to use ear drops. Your health care provider may also give you more specific instructions. Supplies needed:  Cotton ball.  Medicine. How to put ear drops into your ear  1. Wash your hands thoroughly with soap and water. 2. Make sure your ears are clean and dry. If there is any ear wax or drainage at the outermost portion of the ear canal, wipe it out gently with a cotton-tipped applicator. 3. Warm up the medicine by holding it in the palm of your hand for a few minutes. 4. Shake the medicine if it is a suspension. 5. Use the dropper to draw up the medicine. 6. Hold the dropper above your ear canal and put the drops in the affected ear as instructed. Do not put the dropper into your ear at any time. It may help to pull the outer flap of the ear up and back while you put the drops in. Doing this will straighten out the ear canal so the medicine can get into the canal easier. 7. To make sure your ear soaks up the medicine, do either of these things: ? Lie down with the affected ear facing up for 10 minutes. This will cause the drops to stay in the ear canal and run down and fill the canal. ? Gently put a cotton ball in your ear canal. Leave enough of the cotton ball out so it can be easily removed. Do not push the cotton ball down into your ear with a cotton-tipped swab or other instrument. You can remove the cotton ball once the medicine has been absorbed. 8. If both ears need the drops, repeat the procedure for the other ear. Your health care provider will let you know if you need to put drops in both ears. Follow these instructions at home:  Use the ear drops for as long as directed by your health care provider, even if you begin to feel better.  Always wash your hands before and after  handling the ear drops.  Keep the ear drops at room temperature.  Keep all follow-up visits as told by your health care provider. This is important. Contact a health care provider if:  Your condition gets worse.  Your pain gets worse.  You notice any unusual drainage from your ear, especially if the drainage has a bad smell.  You have trouble hearing.  You have used the ear drops for the amount of time recommended by your health care provider, but your symptoms have not improved. Get help right away if:  You experience a form of dizziness in which you feel as if the room is spinning and you feel nauseated (vertigo).  The outside of your ear becomes red or swollen.  You develop a severe headache with or without neck stiffness. Summary  Ear drops are a medicine that is placed in the ear.  Put drops in the affected ear as instructed.  Use the ear drops for as long as directed by your health care provider, even if your symptoms begin to get better.  Keep all follow-up visits as told by your health care provider. This is important. This information is not intended to replace advice given to you by your health care provider. Make sure you discuss any questions you have with  your health care provider. Document Released: 03/02/2001 Document Revised: 02/18/2017 Document Reviewed: 03/11/2016 Elsevier Patient Education  2020 [employer] Maintenance, Male Adopting a healthy lifestyle and getting preventive care are important in promoting health and wellness. Ask your health care provider about:  The right schedule for you to have regular tests and exams.  Things you can do on your own to prevent diseases and keep yourself healthy. What should I know about diet, weight, and exercise? Eat a healthy diet   Eat a diet that includes plenty of vegetables, fruits, low-fat dairy products, and lean protein.  Do not eat a lot of foods that are high in solid fats, added sugars, or  sodium. Maintain a healthy weight Body mass index (BMI) is a measurement that can be used to identify possible weight problems. It estimates body fat based on height and weight. Your health care provider can help determine your BMI and help you achieve or maintain a healthy weight. Get regular exercise Get regular exercise. This is one of the most important things you can do for your health. Most adults should:  Exercise for at least 150 minutes each week. The exercise should increase your heart rate and make you sweat (moderate-intensity exercise).  Do strengthening exercises at least twice a week. This is in addition to the moderate-intensity exercise.  Spend less time sitting. Even light physical activity can be beneficial. Watch cholesterol and blood lipids Have your blood tested for lipids and cholesterol at 58 years of age, then have this test every 5 years. You may need to have your cholesterol levels checked more often if:  Your lipid or cholesterol levels are high.  You are older than 58 years of age.  You are at high risk for heart disease. What should I know about cancer screening? Many types of cancers can be detected early and may often be prevented. Depending on your health history and family history, you may need to have cancer screening at various ages. This may include screening for:  Colorectal cancer.  Prostate cancer.  Skin cancer.  Lung cancer. What should I know about heart disease, diabetes, and high blood pressure? Blood pressure and heart disease  High blood pressure causes heart disease and increases the risk of stroke. This is more likely to develop in people who have high blood pressure readings, are of African descent, or are overweight.  Talk with your health care provider about your target blood pressure readings.  Have your blood pressure checked: ? Every 3-5 years if you are 40-52 years of age. ? Every year if you are 61 years old or older.   If you are between the ages of 55 and 37 and are a current or former smoker, ask your health care provider if you should have a one-time screening for abdominal aortic aneurysm (AAA). Diabetes Have regular diabetes screenings. This checks your fasting blood sugar level. Have the screening done:  Once every three years after age 34 if you are at a normal weight and have a low risk for diabetes.  More often and at a younger age if you are overweight or have a high risk for diabetes. What should I know about preventing infection? Hepatitis B If you have a higher risk for hepatitis B, you should be screened for this virus. Talk with your health care provider to find out if you are at risk for hepatitis B infection. Hepatitis C Blood testing is recommended for:  Everyone born from [birth year]  through 1965.  Anyone with known risk factors for hepatitis C. Sexually transmitted infections (STIs)  You should be screened each year for STIs, including gonorrhea and chlamydia, if: ? You are sexually active and are younger than 58 years of age. ? You are older than 58 years of age and your health care provider tells you that you are at risk for this type of infection. ? Your sexual activity has changed since you were last screened, and you are at increased risk for chlamydia or gonorrhea. Ask your health care provider if you are at risk.  Ask your health care provider about whether you are at high risk for HIV. Your health care provider may recommend a prescription medicine to help prevent HIV infection. If you choose to take medicine to prevent HIV, you should first get tested for HIV. You should then be tested every 3 months for as long as you are taking the medicine. Follow these instructions at home: Lifestyle  Do not use any products that contain nicotine or tobacco, such as cigarettes, e-cigarettes, and chewing tobacco. If you need help quitting, ask your health care provider.  Do not use street  drugs.  Do not share needles.  Ask your health care provider for help if you need support or information about quitting drugs. Alcohol use  Do not drink alcohol if your health care provider tells you not to drink.  If you drink alcohol: ? Limit how much you have to 0-2 drinks a day. ? Be aware of how much alcohol is in your drink. In the [place] one drink equals one 12 oz bottle of beer (355 mL), one 5 oz glass of wine (148 mL), or one 1 oz glass of hard liquor (44 mL). General instructions  Schedule regular health, dental, and eye exams.  Stay current with your vaccines.  Tell your health care provider if: ? You often feel depressed. ? You have ever been abused or do not feel safe at home. Summary  Adopting a healthy lifestyle and getting preventive care are important in promoting health and wellness.  Follow your health care provider's instructions about healthy diet, exercising, and getting tested or screened for diseases.  Follow your health care provider's instructions on monitoring your cholesterol and blood pressure. This information is not intended to replace advice given to you by your health care provider. Make sure you discuss any questions you have with your health care provider. Document Released: 09/04/2007 Document Revised: 03/01/2018 Document Reviewed: 03/01/2018 Elsevier Patient Education  2020 [employer].

## 2018-09-20 ENCOUNTER — Telehealth: Payer: Self-pay

## 2018-09-20 NOTE — Telephone Encounter (Signed)

## 2018-09-21 ENCOUNTER — Other Ambulatory Visit (INDEPENDENT_AMBULATORY_CARE_PROVIDER_SITE_OTHER): Payer: 59

## 2018-09-21 DIAGNOSIS — Z0000 Encounter for general adult medical examination without abnormal findings: Secondary | ICD-10-CM | POA: Diagnosis not present

## 2018-09-21 DIAGNOSIS — I10 Essential (primary) hypertension: Secondary | ICD-10-CM | POA: Diagnosis not present

## 2018-09-21 DIAGNOSIS — E782 Mixed hyperlipidemia: Secondary | ICD-10-CM

## 2018-09-21 LAB — LIPID PANEL
Cholesterol: 143 mg/dL (ref 0–200)
HDL: 49.4 mg/dL (ref >39.00)
LDL Cholesterol: 76 mg/dL (ref 0–99)
NonHDL: 93.25
Total CHOL/HDL Ratio: 3
Triglycerides: 87 mg/dL (ref 0.0–149.0)
VLDL: 17.4 mg/dL (ref 0.0–40.0)

## 2018-09-21 LAB — URINALYSIS, ROUTINE W REFLEX MICROSCOPIC
Bilirubin Urine: NEGATIVE
Hgb urine dipstick: NEGATIVE
Ketones, ur: NEGATIVE
Leukocytes,Ua: NEGATIVE
Nitrite: NEGATIVE
RBC / HPF: NONE SEEN (ref 0–?)
Specific Gravity, Urine: 1.025 (ref 1.000–1.030)
Total Protein, Urine: NEGATIVE
Urine Glucose: NEGATIVE
Urobilinogen, UA: 0.2 (ref 0.0–1.0)
WBC, UA: NONE SEEN (ref 0–?)
pH: 6 (ref 5.0–8.0)

## 2018-09-21 LAB — CBC
HCT: 42.3 % (ref 39.0–52.0)
Hemoglobin: 14.2 g/dL (ref 13.0–17.0)
MCHC: 33.7 g/dL (ref 30.0–36.0)
MCV: 91.8 fl (ref 78.0–100.0)
Platelets: 175 10*3/uL (ref 150.0–400.0)
RBC: 4.6 Mil/uL (ref 4.22–5.81)
RDW: 13.5 % (ref 11.5–15.5)
WBC: 5.1 10*3/uL (ref 4.0–10.5)

## 2018-09-21 LAB — COMPREHENSIVE METABOLIC PANEL
ALT: 33 U/L (ref 0–53)
AST: 24 U/L (ref 0–37)
Albumin: 4.6 g/dL (ref 3.5–5.2)
Alkaline Phosphatase: 56 U/L (ref 39–117)
BUN: 18 mg/dL (ref 6–23)
CO2: 30 mEq/L (ref 19–32)
Calcium: 9 mg/dL (ref 8.4–10.5)
Chloride: 106 mEq/L (ref 96–112)
Creatinine, Ser: 1.07 mg/dL (ref 0.40–1.50)
GFR: 70.97 mL/min (ref >60.00)
Glucose, Bld: 95 mg/dL (ref 70–99)
Potassium: 4 mEq/L (ref 3.5–5.1)
Sodium: 143 mEq/L (ref 135–145)
Total Bilirubin: 0.7 mg/dL (ref 0.2–1.2)
Total Protein: 6.7 g/dL (ref 6.0–8.3)

## 2018-09-21 LAB — PSA: PSA: 0.69 ng/mL (ref 0.10–4.00)

## 2018-09-26 ENCOUNTER — Ambulatory Visit (INDEPENDENT_AMBULATORY_CARE_PROVIDER_SITE_OTHER): Payer: 59 | Admitting: Family Medicine

## 2018-09-26 ENCOUNTER — Other Ambulatory Visit: Payer: Self-pay

## 2018-09-26 ENCOUNTER — Encounter: Payer: Self-pay | Admitting: Family Medicine

## 2018-09-26 VITALS — BP 120/70 | HR 71 | Ht 75.0 in

## 2018-09-26 DIAGNOSIS — M5412 Radiculopathy, cervical region: Secondary | ICD-10-CM

## 2018-09-26 DIAGNOSIS — M999 Biomechanical lesion, unspecified: Secondary | ICD-10-CM

## 2018-09-26 NOTE — Progress Notes (Signed)
[name] [address], [place] [address] Phone: [phone] Subjective:    I'm seeing this patient by the request  of:    CC: Neck pain follow-up  [id number]  Stephen [name]. is a 58 y.o. male coming in with complaint of neck pain.  Patient has had this for quite some time.  Has known degenerative disc disease with some radicular symptoms intermittently.  Some mild increase on the right side.  More discomfort recently.  Working on a computer on a more regular basis.  Denies any weakness in the upper extremity though.     Past Medical History:  Diagnosis Date  . Colon polyps   . Detached retina, right 1992   Dr. [name] at [facility] did orbital band procedure with good results.   . High cholesterol   . Hypertension   . Poison ivy dermatitis 07/29/2015   Past Surgical History:  Procedure Laterality Date  . RETINAL DETACHMENT SURGERY     Social History   Socioeconomic History  . Marital status: Married    Spouse name: Not on file  . Number of children: 2  . Years of education: 31  . Highest education level: Not on file  Occupational History  . Occupation: busines    Employer: [employer]  Social Needs  . Financial resource strain: Not on file  . Food insecurity    Worry: Not on file    Inability: Not on file  . Transportation needs    Medical: Not on file    Non-medical: Not on file  Tobacco Use  . Smoking status: Never Smoker  . Smokeless tobacco: Never Used  Substance and Sexual Activity  . Alcohol use: Yes    Alcohol/week: 1.0 standard drinks    Types: 1 drink(s) per week  . Drug use: No  . Sexual activity: Yes    Partners: Female  Lifestyle  . Physical activity    Days per week: Not on file    Minutes per session: Not on file  . Stress: Not on file  Relationships  . Social Herbalist on phone: Not on file    Gets together: Not on file    Attends religious service: Not on file    Active member of club  or organization: Not on file    Attends meetings of clubs or organizations: Not on file    Relationship status: Not on file  Other Topics Concern  . Not on file  Social History Narrative   HSG, [employer] - BS business admin. Married '84 - 1 son '86, 1 dtr -'46. 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 ACP discussed and he is referred to [email or web address].   No Known Allergies Family History  Problem Relation Age of Onset  . Parkinsonism Mother   . Cancer Father        colon cancer with mets  . Hyperlipidemia Father   . Hypertension Father   . Hypertension Brother   . Diabetes Neg Hx   . Heart disease Neg Hx      Current Outpatient Medications (Cardiovascular):  .  lisinopril (ZESTRIL) 20 MG tablet, TAKE 1 TABLET BY MOUTH ONCE DAILY .  pravastatin (PRAVACHOL) 40 MG tablet, TAKE 1 TABLET BY MOUTH AT BEDTIME.     Current Outpatient Medications (Other):  .  fish oil-omega-3 fatty acids 1000 MG capsule, Take 2 g by mouth daily.   Marland Kitchen  Multiple Vitamin (MULTIVITAMIN) tablet, Take 1 tablet by mouth daily.   Marland Kitchen  venlafaxine (EFFEXOR) 37.5 MG tablet, Take 37.5 mg by mouth 2 (two) times daily.    Past medical history, social, surgical and family history all reviewed in electronic medical record.  No pertanent information unless stated regarding to the chief complaint.   Review of Systems:  No headache, visual changes, nausea, vomiting, diarrhea, constipation, dizziness, abdominal pain, skin rash, fevers, chills, night sweats, weight loss, swollen lymph nodes, body aches, joint swelling, muscle aches, chest pain, shortness of breath, mood changes.   Objective  Blood pressure 120/70, pulse 71, height 6\' 3"  (1.905 m), SpO2 98 %.   General: No apparent distress alert and oriented x3 mood and affect normal, dressed appropriately.  HEENT: Pupils equal, extraocular movements intact  Respiratory: Patient's speak in  full sentences and does not appear short of breath  Cardiovascular: No lower extremity edema, non tender, no erythema  Skin: Warm dry intact with no signs of infection or rash on extremities or on axial skeleton.  Abdomen: Soft nontender  Neuro: Cranial nerves II through XII are intact, neurovascularly intact in all extremities with 2+ DTRs and 2+ pulses.  Lymph: No lymphadenopathy of posterior or anterior cervical chain or axillae bilaterally.  Gait normal with good balance and coordination.  MSK:  Non tender with full range of motion and good stability and symmetric strength and tone of shoulders, elbows, wrist, hip, knee and ankles bilaterally.  Neck: Inspection loss of lordosis. No palpable stepoffs. Negative Spurling's maneuver. Range of motion lacking last 5 degrees of extension.  Mild decrease in sidebending bilaterally Grip strength and sensation normal in bilateral hands Strength good C4 to T1 distribution No sensory change to C4 to T1 Negative Hoffman sign bilaterally Reflexes normal  Osteopathic findings C2 flexed rotated and side bent right C7 flexed rotated and side bent left T6 extended rotated and side bent left L3 flexed rotated and side bent right Sacrum right on right    Impression and Recommendations:     This case required medical decision making of moderate complexity. The above documentation has been reviewed and is accurate and complete Lyndal Pulley, DO       Note: This dictation was prepared with Dragon dictation along with smaller phrase technology. Any transcriptional errors that result from this process are unintentional.

## 2018-09-26 NOTE — Assessment & Plan Note (Signed)
Decision today to treat with OMT was based on Physical Exam  After verbal consent patient was treated with HVLA, ME, FPR techniques in cervical, thoracic, lumbar and sacral areas  Patient tolerated the procedure well with improvement in symptoms  Patient given exercises, stretches and lifestyle modifications  See medications in patient instructions if given  Patient will follow up in 4 weeks 

## 2018-09-26 NOTE — Patient Instructions (Signed)
Keep watching posture See me in 5-6 weeks

## 2018-09-26 NOTE — Assessment & Plan Note (Signed)
Stable overall.  Discussed which activities of doing which wants to avoid.  Patient is to increase activity slowly over the course the next several weeks.  We discussed avoiding certain activities.  Discussed posture and ergonomics.  Patient is to increase activities.  Follow-up again 4 to 8 weeks

## 2018-10-18 ENCOUNTER — Other Ambulatory Visit: Payer: Self-pay | Admitting: Family Medicine

## 2018-10-18 DIAGNOSIS — E782 Mixed hyperlipidemia: Secondary | ICD-10-CM

## 2018-10-18 MED FILL — PRAVASTATIN NA 40 MG TAB: 40 | 90 days supply | Qty: 90 | Fill #0

## 2018-10-23 DIAGNOSIS — H5211 Myopia, right eye: Secondary | ICD-10-CM | POA: Diagnosis not present

## 2018-10-23 DIAGNOSIS — H524 Presbyopia: Secondary | ICD-10-CM | POA: Diagnosis not present

## 2018-10-23 DIAGNOSIS — H52222 Regular astigmatism, left eye: Secondary | ICD-10-CM | POA: Diagnosis not present

## 2018-10-23 DIAGNOSIS — H52221 Regular astigmatism, right eye: Secondary | ICD-10-CM | POA: Diagnosis not present

## 2018-10-23 DIAGNOSIS — H5202 Hypermetropia, left eye: Secondary | ICD-10-CM | POA: Diagnosis not present

## 2018-11-01 ENCOUNTER — Other Ambulatory Visit: Payer: Self-pay | Admitting: Family Medicine

## 2018-11-03 NOTE — Telephone Encounter (Signed)
Pharmacy called stating the pt is out of this medication. Please advise.

## 2018-11-06 MED FILL — VENLAFAXINE HCL ER 37.5 MG: 37.5 | 90 days supply | Qty: 180 | Fill #0

## 2018-11-07 NOTE — Assessment & Plan Note (Signed)
Decision today to treat with OMT was based on Physical Exam  After verbal consent patient was treated with HVLA, ME, FPR techniques in cervical, thoracic, lumbar and sacral areas  Patient tolerated the procedure well with improvement in symptoms  Patient given exercises, stretches and lifestyle modifications  See medications in patient instructions if given  Patient will follow up in 4-8 weeks 

## 2018-11-07 NOTE — Progress Notes (Signed)
[name] [address] [place], [place] [address] Phone: [phone] Subjective:   I [name] am serving as a Education administrator for Dr. Hulan Saas.  I'm seeing this patient by the request  of:    CC: Neck and back pain follow-up  IRS:[id number]  Stephen [name]. is a 58 y.o. male coming in with complaint of neck and back pain. States that he is doing well.  Pain the patient has been making significant improvement.  Discussed with patient that I think a formal home exercise, which activities of doing which wants to avoid.  Patient has been doing somewhat been doing more yard work.  Feels that that has contributed to some of the neck pain.     Past Medical History:  Diagnosis Date  . Colon polyps   . Detached retina, right 1992   Dr. [name] at [facility] did orbital band procedure with good results.   . High cholesterol   . Hypertension   . Poison ivy dermatitis 07/29/2015   Past Surgical History:  Procedure Laterality Date  . RETINAL DETACHMENT SURGERY     Social History   Socioeconomic History  . Marital status: Married    Spouse name: Not on file  . Number of children: 2  . Years of education: 67  . Highest education level: Not on file  Occupational History  . Occupation: busines    Employer: [employer]  Social Needs  . Financial resource strain: Not on file  . Food insecurity    Worry: Not on file    Inability: Not on file  . Transportation needs    Medical: Not on file    Non-medical: Not on file  Tobacco Use  . Smoking status: Never Smoker  . Smokeless tobacco: Never Used  Substance and Sexual Activity  . Alcohol use: Yes    Alcohol/week: 1.0 standard drinks    Types: 1 drink(s) per week  . Drug use: No  . Sexual activity: Yes    Partners: Female  Lifestyle  . Physical activity    Days per week: Not on file    Minutes per session: Not on file  . Stress: Not on file  Relationships  . Social Herbalist on phone: Not  on file    Gets together: Not on file    Attends religious service: Not on file    Active member of club or organization: Not on file    Attends meetings of clubs or organizations: Not on file    Relationship status: Not on file  Other Topics Concern  . Not on file  Social History Narrative   HSG, [employer] - BS business admin. Married '84 - 1 son '86, 1 dtr -'71. 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 ACP discussed and he is referred to [email or web address].   No Known Allergies Family History  Problem Relation Age of Onset  . Parkinsonism Mother   . Cancer Father        colon cancer with mets  . Hyperlipidemia Father   . Hypertension Father   . Hypertension Brother   . Diabetes Neg Hx   . Heart disease Neg Hx      Current Outpatient Medications (Cardiovascular):  .  lisinopril (ZESTRIL) 20 MG tablet, TAKE 1 TABLET BY MOUTH ONCE DAILY .  pravastatin (PRAVACHOL) 40 MG tablet, TAKE 1 TABLET BY MOUTH AT BEDTIME.  Current Outpatient Medications (Other):  .  fish oil-omega-3 fatty acids 1000 MG capsule, Take 2 g by mouth daily.   .  Multiple Vitamin (MULTIVITAMIN) tablet, Take 1 tablet by mouth daily.   Marland Kitchen  venlafaxine (EFFEXOR) 37.5 MG tablet, Take 37.5 mg by mouth 2 (two) times daily. Marland Kitchen  venlafaxine XR (EFFEXOR-XR) 37.5 MG 24 hr capsule, TAKE 2 CAPSULES BY MOUTH DAILY WITH BREAKFAST    Past medical history, social, surgical and family history all reviewed in electronic medical record.  No pertanent information unless stated regarding to the chief complaint.   Review of Systems:  No headache, visual changes, nausea, vomiting, diarrhea, constipation, dizziness, abdominal pain, skin rash, fevers, chills, night sweats, weight loss, swollen lymph nodes, body aches, joint swelling,  chest pain, shortness of breath, mood changes.  Positive muscle aches  Objective  Blood pressure 120/70, pulse 71, height  6\' 3"  (1.905 m), weight 201 lb (91.2 kg), SpO2 98 %.  General: No apparent distress alert and oriented x3 mood and affect normal, dressed appropriately.  HEENT: Pupils equal, extraocular movements intact  Respiratory: Patient's speak in full sentences and does not appear short of breath  Cardiovascular: No lower extremity edema, non tender, no erythema  Skin: Warm dry intact with no signs of infection or rash on extremities or on axial skeleton.  Abdomen: Soft nontender  Neuro: Cranial nerves II through XII are intact, neurovascularly intact in all extremities with 2+ DTRs and 2+ pulses.  Lymph: No lymphadenopathy of posterior or anterior cervical chain or axillae bilaterally.  Gait normal with good balance and coordination.  MSK:  Non tender with full range of motion and good stability and symmetric strength and tone of shoulders, elbows, wrist, hip, knee and ankles bilaterally.  Neck: Inspection mild loss of lordosis. No palpable stepoffs. Negative Spurling's maneuver. Full neck range of motion Grip strength and sensation normal in bilateral hands Strength good C4 to T1 distribution No sensory change to C4 to T1 Negative Hoffman sign bilaterally Reflexes normal Tightness in the trapezius bilaterally  Back Exam:  Inspection: Unremarkable  Motion: Flexion 45 deg, Extension 45 deg, Side Bending to 45 deg bilaterally,  Rotation to 45 deg bilaterally  SLR laying: Negative  XSLR laying: Negative  Palpable tenderness: Tender to palpation paraspinal musculature lumbar spine right greater than left. FABER: negative. Sensory change: Gross sensation intact to all lumbar and sacral dermatomes.  Reflexes: 2+ at both patellar tendons, 2+ at achilles tendons, Babinski's downgoing.  Strength at foot  Plantar-flexion: 5/5 Dorsi-flexion: 5/5 Eversion: 5/5 Inversion: 5/5  Leg strength  Quad: 5/5 Hamstring: 5/5 Hip flexor: 5/5 Hip abductors: 5/5    Osteopathic findings C2 flexed rotated and  side bent right C4 flexed rotated and side bent left T9 extended rotated and side bent left L2 flexed rotated and side bent right Sacrum right on right    Impression and Recommendations:     This case required medical decision making of moderate complexity. The above documentation has been reviewed and is accurate and complete Lyndal Pulley, DO       Note: This dictation was prepared with Dragon dictation along with smaller phrase technology. Any transcriptional errors that result from this process are unintentional.

## 2018-11-07 NOTE — Assessment & Plan Note (Signed)
Continues to have intermittent pain.  Not stopping him from any significant activity level.  Has responded fairly well to osteopathic manipulation.  Patient is wanting to avoid anything such as medications on to mature regular process.  Follow-up again in 4 to 8 weeks

## 2018-11-08 ENCOUNTER — Other Ambulatory Visit: Payer: Self-pay

## 2018-11-08 ENCOUNTER — Encounter: Payer: Self-pay | Admitting: Family Medicine

## 2018-11-08 ENCOUNTER — Ambulatory Visit (INDEPENDENT_AMBULATORY_CARE_PROVIDER_SITE_OTHER): Payer: 59 | Admitting: Family Medicine

## 2018-11-08 VITALS — BP 120/70 | HR 71 | Ht 75.0 in | Wt 201.0 lb

## 2018-11-08 DIAGNOSIS — M999 Biomechanical lesion, unspecified: Secondary | ICD-10-CM | POA: Diagnosis not present

## 2018-11-08 DIAGNOSIS — M5412 Radiculopathy, cervical region: Secondary | ICD-10-CM

## 2018-11-08 NOTE — Patient Instructions (Signed)
Good to see you Good luck with the yard work See me again in 4-5 weeks

## 2018-11-20 ENCOUNTER — Other Ambulatory Visit: Payer: Self-pay | Admitting: Family Medicine

## 2018-11-20 DIAGNOSIS — I10 Essential (primary) hypertension: Secondary | ICD-10-CM

## 2018-11-20 MED FILL — LISINOPRIL 20 MG TABLET: 20 | 90 days supply | Qty: 90 | Fill #0

## 2018-12-13 ENCOUNTER — Encounter: Payer: Self-pay | Admitting: Family Medicine

## 2018-12-13 ENCOUNTER — Other Ambulatory Visit: Payer: Self-pay

## 2018-12-13 ENCOUNTER — Ambulatory Visit: Payer: 59 | Admitting: Family Medicine

## 2018-12-13 VITALS — BP 126/72 | HR 74 | Ht 75.0 in | Wt 201.0 lb

## 2018-12-13 DIAGNOSIS — M999 Biomechanical lesion, unspecified: Secondary | ICD-10-CM | POA: Diagnosis not present

## 2018-12-13 DIAGNOSIS — M5412 Radiculopathy, cervical region: Secondary | ICD-10-CM

## 2018-12-13 NOTE — Patient Instructions (Signed)
Doing well Watch lifting mechanics See me again in 6 weeks

## 2018-12-13 NOTE — Progress Notes (Signed)
[name] [place], [place] [address] Phone: [phone] Subjective:   Stephen [name], am serving as a scribe for Dr. Hulan Saas.  I'm seeing this patient by the request  of:    CC: neck and back pain   [id number]   11/08/2018 Continues to have intermittent pain.  Not stopping him from any significant activity level.  Has responded fairly well to osteopathic manipulation.  Patient is wanting to avoid anything such as medications on to mature regular process.  Follow-up again in 4 to 8 weeks  Update 12/13/2018 Stephen [name]. is a 58 y.o. male coming in with complaint of back pain. Is here for OMT to manage his cervical and lumbar pain. Patient states has been moving boxes.  He is in the process of moving houses.  Patient denies any radiation down the arm when he has had previously.  [name] weakness. -     Past Medical History:  Diagnosis Date  . Colon polyps   . Detached retina, right 1992   Dr. [name] at [facility] did orbital band procedure with good results.   . High cholesterol   . Hypertension   . Poison ivy dermatitis 07/29/2015   Past Surgical History:  Procedure Laterality Date  . RETINAL DETACHMENT SURGERY     Social History   Socioeconomic History  . Marital status: Married    Spouse name: Not on file  . Number of children: 2  . Years of education: 77  . Highest education level: Not on file  Occupational History  . Occupation: busines    Employer: [employer]  Social Needs  . Financial resource strain: Not on file  . Food insecurity    Worry: Not on file    Inability: Not on file  . Transportation needs    Medical: Not on file    Non-medical: Not on file  Tobacco Use  . Smoking status: Never Smoker  . Smokeless tobacco: Never Used  Substance and Sexual Activity  . Alcohol use: Yes    Alcohol/week: 1.0 standard drinks    Types: 1 drink(s) per week  . Drug use: [name]  . Sexual activity: Yes    Partners: Female   Lifestyle  . Physical activity    Days per week: Not on file    Minutes per session: Not on file  . Stress: Not on file  Relationships  . Social Herbalist on phone: Not on file    Gets together: Not on file    Attends religious service: Not on file    Active member of club or organization: Not on file    Attends meetings of clubs or organizations: Not on file    Relationship status: Not on file  Other Topics Concern  . Not on file  Social History Narrative   HSG, [employer] - BS business admin. Married '84 - 1 son '86, 1 dtr -'90. 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 ACP discussed and he is referred to [email or web address].   [name] Known Allergies Family History  Problem Relation Age of Onset  . Parkinsonism Mother   . Cancer Father        colon cancer with mets  . Hyperlipidemia Father   . Hypertension Father   . Hypertension Brother   . Diabetes Neg Hx   . Heart disease Neg Hx      Current Outpatient Medications (  Cardiovascular):  .  lisinopril (ZESTRIL) 20 MG tablet, TAKE 1 TABLET BY MOUTH ONCE DAILY .  pravastatin (PRAVACHOL) 40 MG tablet, TAKE 1 TABLET BY MOUTH AT BEDTIME.     Current Outpatient Medications (Other):  .  fish oil-omega-3 fatty acids 1000 MG capsule, Take 2 g by mouth daily.   .  Multiple Vitamin (MULTIVITAMIN) tablet, Take 1 tablet by mouth daily.   Marland Kitchen  venlafaxine (EFFEXOR) 37.5 MG tablet, Take 37.5 mg by mouth 2 (two) times daily. Marland Kitchen  venlafaxine XR (EFFEXOR-XR) 37.5 MG 24 hr capsule, TAKE 2 CAPSULES BY MOUTH DAILY WITH BREAKFAST    Past medical history, social, surgical and family history all reviewed in electronic medical record.  [name] pertanent information unless stated regarding to the chief complaint.   Review of Systems:  [name] headache, visual changes, nausea, vomiting, diarrhea, constipation, dizziness, abdominal pain, skin rash, fevers, chills, night sweats,  weight loss, swollen lymph nodes, body aches, joint swelling, he is in agreement, chest pain, shortness of breath, mood changes.  Positive muscle aches  Objective  Blood pressure 126/72, pulse 74, height 6\' 3"  (1.905 m), weight 201 lb (91.2 kg), SpO2 98 %.     General: [name] apparent distress alert and oriented x3 mood and affect normal, dressed appropriately.  HEENT: Pupils equal, extraocular movements intact  Respiratory: Patient's speak in full sentences and does not appear short of breath  Cardiovascular: [name] lower extremity edema, non tender, [name] erythema  Skin: Warm dry intact with [name] signs of infection or rash on extremities or on axial skeleton.  Abdomen: Soft nontender  Neuro: Cranial nerves II through XII are intact, neurovascularly intact in all extremities with 2+ DTRs and 2+ pulses.  Lymph: [name] lymphadenopathy of posterior or anterior cervical chain or axillae bilaterally.  Gait normal with good balance and coordination.  MSK:  Non tender with full range of motion and good stability and symmetric strength and tone of shoulders, elbows, wrist, hip, knee and ankles bilaterally.  Neck: Inspection mild loss of lordosis. [name] palpable stepoffs. Negative Spurling's maneuver. Mild limitation with right-sided rotation and sidebending Grip strength and sensation normal in bilateral hands Strength good C4 to T1 distribution [name] sensory change to C4 to T1 Negative Hoffman sign bilaterally Reflexes normal Tightness of the trapezius right greater than left  Osteopathic findings  C2 flexed rotated and side bent left C6 flexed rotated and side bent left T3 extended rotated and side bent right inhaled third rib T11 extended rotated and side bent left     Impression and Recommendations:     This case required medical decision making of moderate complexity. The above documentation has been reviewed and is accurate and complete Lyndal Pulley, DO       Note: This dictation was  prepared with Dragon dictation along with smaller phrase technology. Any transcriptional errors that result from this process are unintentional.

## 2018-12-13 NOTE — Assessment & Plan Note (Signed)
Decision today to treat with OMT was based on Physical Exam  After verbal consent patient was treated with HVLA, ME, FPR techniques in cervical, thoracic, rib areas  Patient tolerated the procedure well with improvement in symptoms  Patient given exercises, stretches and lifestyle modifications  See medications in patient instructions if given  Patient will follow up in 4-8 weeks 

## 2018-12-13 NOTE — Assessment & Plan Note (Signed)
Has had radicular symptoms previously.  Discussed posture and ergonomics, which activities to do which wants to avoid.  Increase activity as tolerated.  Patient is having no significant radicular symptoms and responding fairly well to the Effexor.  Follow-up again in 4 to 8 weeks

## 2018-12-16 ENCOUNTER — Other Ambulatory Visit: Payer: Self-pay

## 2018-12-16 ENCOUNTER — Emergency Department (HOSPITAL_BASED_OUTPATIENT_CLINIC_OR_DEPARTMENT_OTHER)
Admission: EM | Admit: 2018-12-16 | Discharge: 2018-12-16 | Disposition: A | Discharge status: Home / Self Care | Payer: 59 | Attending: Emergency Medicine | Admitting: Emergency Medicine

## 2018-12-16 ENCOUNTER — Encounter (HOSPITAL_BASED_OUTPATIENT_CLINIC_OR_DEPARTMENT_OTHER): Payer: Self-pay

## 2018-12-16 DIAGNOSIS — Y929 Unspecified place or not applicable: Secondary | ICD-10-CM | POA: Diagnosis not present

## 2018-12-16 DIAGNOSIS — S61214A Laceration without foreign body of right ring finger without damage to nail, initial encounter: Secondary | ICD-10-CM | POA: Insufficient documentation

## 2018-12-16 DIAGNOSIS — Y999 Unspecified external cause status: Secondary | ICD-10-CM | POA: Diagnosis not present

## 2018-12-16 DIAGNOSIS — W268XXA Contact with other sharp object(s), not elsewhere classified, initial encounter: Secondary | ICD-10-CM | POA: Diagnosis not present

## 2018-12-16 DIAGNOSIS — I10 Essential (primary) hypertension: Secondary | ICD-10-CM | POA: Diagnosis not present

## 2018-12-16 DIAGNOSIS — E782 Mixed hyperlipidemia: Secondary | ICD-10-CM | POA: Diagnosis not present

## 2018-12-16 DIAGNOSIS — Z79899 Other long term (current) drug therapy: Secondary | ICD-10-CM | POA: Insufficient documentation

## 2018-12-16 DIAGNOSIS — Y9389 Activity, other specified: Secondary | ICD-10-CM | POA: Diagnosis not present

## 2018-12-16 DIAGNOSIS — S61212A Laceration without foreign body of right middle finger without damage to nail, initial encounter: Secondary | ICD-10-CM | POA: Insufficient documentation

## 2018-12-16 DIAGNOSIS — Z23 Encounter for immunization: Secondary | ICD-10-CM | POA: Insufficient documentation

## 2018-12-16 MED ORDER — TETANUS-DIPHTH-ACELL PERTUSSIS 5-2.5-18.5 LF-MCG/0.5 IM SUSP
0.5000 mL | Freq: Once | INTRAMUSCULAR | Status: AC
  Administered 2018-12-16: 0.5 mL via INTRAMUSCULAR
  Filled 2018-12-16: qty 0.5

## 2018-12-16 MED ORDER — LIDOCAINE HCL (PF) 2 % IJ SOLN
10.0000 mL | Freq: Once | INTRAMUSCULAR | Status: DC
  Filled 2018-12-16: qty 10

## 2018-12-16 MED ORDER — LIDOCAINE HCL 2 % IJ SOLN
INTRAMUSCULAR | Status: AC
  Filled 2018-12-16: qty 20

## 2018-12-16 NOTE — ED Notes (Signed)
EMT Amy performing wound cleanse and applying splint

## 2018-12-16 NOTE — ED Provider Notes (Signed)
[facility] Provider Note   CSN: [id number] Arrival date & time: 12/16/18  2021     History   Chief Complaint Chief Complaint  Patient presents with   Finger Injury    HPI Stephen [name]. is a 58 y.o. male.     HPI  58 year old male presents with right finger laceration.  He was trying to move a metal filing cabinet and his finger slipped on the edge, causing a laceration to his ulnar fourth finger.  There is a small laceration to his middle finger.  Had a hard time controlling the ring finger laceration.  No numbness or weakness.  Some throbbing pain.  Unknown last tetanus immunization. The metal did not break or loosen.  Past Medical History:  Diagnosis Date   Colon polyps    Detached retina, right 67   Dr. [name] at [facility] did orbital band procedure with good results.    High cholesterol    Hypertension    Poison ivy dermatitis 07/29/2015    Patient Active Problem List   Diagnosis Date Noted   Right hand pain 11/15/2017   Dermatitis due to drug reaction 09/02/2017   Drug-induced erythroderma 09/02/2017   Laryngitis, acute 08/04/2017   Nonallopathic lesion of cervical region 04/06/2017   Nonallopathic lesion of thoracic region 04/06/2017   Nonallopathic lesion of lumbosacral region 04/06/2017   Cervical radiculopathy at C8 03/04/2017   Excessive cerumen in both ear canals 02/15/2017   Neuropathy, cervical plexus 02/15/2017   Poison ivy dermatitis 07/29/2015   Pharyngitis 09/26/2013   Back strain 06/11/2013   Healthcare maintenance 04/25/2011   Mixed hyperlipidemia 03/14/2010   Essential hypertension 03/14/2010    Past Surgical History:  Procedure Laterality Date   RETINAL DETACHMENT SURGERY          Home Medications    Prior to Admission medications   Medication Sig Start Date End Date Taking? Authorizing Provider  fish oil-omega-3 fatty acids 1000 MG capsule Take 2 g by mouth daily.       [provider]  lisinopril (ZESTRIL) 20 MG tablet TAKE 1 TABLET BY MOUTH ONCE DAILY 11/20/18   [name], MD  Multiple Vitamin (MULTIVITAMIN) tablet Take 1 tablet by mouth daily.      [provider]  pravastatin (PRAVACHOL) 40 MG tablet TAKE 1 TABLET BY MOUTH AT BEDTIME. 10/18/18   [name], MD  venlafaxine [facility]) 37.5 MG tablet Take 37.5 mg by mouth 2 (two) times daily.    [provider]  venlafaxine XR (EFFEXOR-XR) 37.5 MG 24 hr capsule TAKE 2 CAPSULES BY MOUTH DAILY WITH BREAKFAST 11/06/18   Lyndal Pulley, DO    Family History Family History  Problem Relation Age of Onset   Parkinsonism Mother    Cancer Father        colon cancer with mets   Hyperlipidemia Father    Hypertension Father    Hypertension Brother    Diabetes Neg Hx    Heart disease Neg Hx     Social History Social History   Tobacco Use   Smoking status: Never Smoker   Smokeless tobacco: Never Used  Substance Use Topics   Alcohol use: Yes    Alcohol/week: 1.0 standard drinks    Types: 1 Standard drinks or equivalent per week   Drug use: No     Allergies   Patient has no known allergies.   Review of Systems Review of Systems  Skin: Positive for wound.  Neurological: Negative for weakness and numbness.     Physical Exam Updated Vital Signs BP (!) 152/88 (BP Location: Left Arm)    Pulse 71    Temp 98.4 F (36.9 C) (Oral)    Resp 18    Ht 6\' 3"  (1.905 m)    Wt 91.2 kg    SpO2 100%    BMI 25.12 kg/m   Physical Exam Vitals signs and nursing note reviewed.  Constitutional:      Appearance: He is well-developed.  HENT:     Head: Normocephalic and atraumatic.     Right Ear: External ear normal.     Left Ear: External ear normal.     Nose: Nose normal.  Eyes:     General:        Right eye: No discharge.        Left eye: No discharge.  Neck:     Musculoskeletal: Neck supple.  Cardiovascular:     Rate and Rhythm: Normal rate.    Pulmonary:     Effort: Pulmonary effort is normal.  Abdominal:     General: There is no distension.  Musculoskeletal:     Right hand: He exhibits tenderness (mild) and laceration. He exhibits normal range of motion and no swelling.       Hands:     Comments: Normal strength/sensation and capillary refill in right fingers. Normal ROM of fingers.  Skin:    General: Skin is warm and dry.  Neurological:     Mental Status: He is alert.  Psychiatric:        Mood and Affect: Mood is not anxious.      ED Treatments / Results  Labs (all labs ordered are listed, but only abnormal results are displayed) Labs Reviewed - No data to display  EKG None  Radiology No results found.  Procedures Procedures (including critical care time)  Medications Ordered in ED Medications  lidocaine (XYLOCAINE) 2 % injection 10 mL (has no administration in time range)  lidocaine (XYLOCAINE) 2 % (with pres) injection (has no administration in time range)  Tdap (BOOSTRIX) injection 0.5 mL (0.5 mLs Intramuscular Given 12/16/18 2200)     Initial Impression / Assessment and Plan / ED Course  I have reviewed the triage vital signs and the nursing notes.  Pertinent labs & imaging results that were available during my care of the patient were reviewed by me and considered in my medical decision making (see chart for details).        Patient with superficial laceration.  Also has a very small laceration to the middle finger but does not appear to need repair.  Please see Beverley Fiedler Soto's note for laceration repair.  Otherwise no real trauma to suggest fracture or need for x-ray.  It is superficial and highly doubt foreign body.  Final Clinical Impressions(s) / ED Diagnoses   Final diagnoses:  Laceration of right ring finger without foreign body without damage to nail, initial encounter    ED Discharge Orders    None       Sherwood Gambler, MD 12/17/18 0009

## 2018-12-16 NOTE — ED Notes (Signed)
ED Provider at bedside suturing patient

## 2018-12-16 NOTE — ED Notes (Signed)
Lidocaine at bedside.

## 2018-12-16 NOTE — ED Provider Notes (Signed)
LACERATION REPAIR Performed by: Janeece Fitting Authorized by: Janeece Fitting Consent: Verbal consent obtained. Risks and benefits: risks, benefits and alternatives were discussed Consent given by: patient Patient identity confirmed: provided demographic data Prepped and Draped in normal sterile fashion Wound explored  Laceration Location: right ring finger   Laceration Length: 1 cm  No Foreign Bodies seen or palpated  Anesthesia: local infiltration  Local anesthetic: lidocaine without epinephrine  Anesthetic total: 2 ml  Irrigation method: syringe Amount of cleaning: standard  Skin closure: close   Number of sutures: 2  Technique: simple interrupted  Patient tolerance: Patient tolerated the procedure well with no immediate complications.    Janeece Fitting, PA-C 12/16/18 2215    Sherwood Gambler, MD 12/17/18 [phone]

## 2018-12-16 NOTE — ED Triage Notes (Signed)
Pt has a lac to 4th digit. Pt was moving a filing cabinet and caught it on some metal.

## 2018-12-16 NOTE — ED Notes (Signed)
Spouse escorted to bedside at patient's request.

## 2018-12-18 ENCOUNTER — Encounter: Payer: Self-pay | Admitting: Family Medicine

## 2018-12-25 ENCOUNTER — Other Ambulatory Visit: Payer: Self-pay

## 2018-12-26 ENCOUNTER — Encounter: Payer: Self-pay | Admitting: Family Medicine

## 2018-12-26 ENCOUNTER — Ambulatory Visit: Payer: 59 | Admitting: Family Medicine

## 2018-12-26 VITALS — BP 124/80 | HR 70 | Ht 75.0 in | Wt 200.1 lb

## 2018-12-26 DIAGNOSIS — Z4802 Encounter for removal of sutures: Secondary | ICD-10-CM | POA: Diagnosis not present

## 2018-12-26 NOTE — Progress Notes (Signed)
Established Patient Office Visit  Subjective:  Patient ID: Stephen Canuto., male    DOB: [birth year]  Age: 58 y.o. MRN: [id number]  CC:  Chief Complaint  Patient presents with  . remove stitches    HPI Stephen [name]. presents for suture removal status post suture replacement into the pad of the fourth finger 10 days ago.  Wound is done well.  There is been no bleeding erythema discharge or streaking.  No fever or chills.  Past Medical History:  Diagnosis Date  . Colon polyps   . Detached retina, right 1992   Dr. [name] at [facility] did orbital band procedure with good results.   . High cholesterol   . Hypertension   . Poison ivy dermatitis 07/29/2015    Past Surgical History:  Procedure Laterality Date  . RETINAL DETACHMENT SURGERY      Family History  Problem Relation Age of Onset  . Parkinsonism Mother   . Cancer Father        colon cancer with mets  . Hyperlipidemia Father   . Hypertension Father   . Hypertension Brother   . Diabetes Neg Hx   . Heart disease Neg Hx     Social History   Socioeconomic History  . Marital status: Married    Spouse name: Not on file  . Number of children: 2  . Years of education: 67  . Highest education level: Not on file  Occupational History  . Occupation: busines    Employer: [employer]  Social Needs  . Financial resource strain: Not on file  . Food insecurity    Worry: Not on file    Inability: Not on file  . Transportation needs    Medical: Not on file    Non-medical: Not on file  Tobacco Use  . Smoking status: Never Smoker  . Smokeless tobacco: Never Used  Substance and Sexual Activity  . Alcohol use: Yes    Alcohol/week: 1.0 standard drinks    Types: 1 Standard drinks or equivalent per week  . Drug use: No  . Sexual activity: Yes    Partners: Female  Lifestyle  . Physical activity    Days per week: Not on file    Minutes per session: Not on file  . Stress: Not on file  Relationships  . Social  Herbalist on phone: Not on file    Gets together: Not on file    Attends religious service: Not on file    Active member of club or organization: Not on file    Attends meetings of clubs or organizations: Not on file    Relationship status: Not on file  . Intimate partner violence    Fear of current or ex partner: Not on file    Emotionally abused: Not on file    Physically abused: Not on file    Forced sexual activity: Not on file  Other Topics Concern  . Not on file  Social History Narrative   HSG, [employer] - BS business admin. Married '84 - 1 son '86, 1 dtr -'57. 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 ACP discussed and he is referred to [email or web address].    Outpatient Medications Prior to Visit  Medication Sig Dispense Refill  . fish oil-omega-3 fatty acids 1000 MG capsule Take 2 g by mouth daily.      Marland Kitchen lisinopril (ZESTRIL) 20 MG tablet TAKE 1 TABLET BY  MOUTH ONCE DAILY 90 tablet 0  . Multiple Vitamin (MULTIVITAMIN) tablet Take 1 tablet by mouth daily.      . pravastatin (PRAVACHOL) 40 MG tablet TAKE 1 TABLET BY MOUTH AT BEDTIME. 90 tablet 1  . venlafaxine (EFFEXOR) 37.5 MG tablet Take 37.5 mg by mouth 2 (two) times daily.    Marland Kitchen venlafaxine XR (EFFEXOR-XR) 37.5 MG 24 hr capsule TAKE 2 CAPSULES BY MOUTH DAILY WITH BREAKFAST 180 capsule 1   No facility-administered medications prior to visit.     No Known Allergies  ROS Review of Systems  Constitutional: Negative.   Respiratory: Negative.   Cardiovascular: Negative.   Skin: Positive for wound. Negative for color change.  Neurological: Negative for weakness and numbness.  Psychiatric/Behavioral: Negative.       Objective:    Physical Exam  Constitutional: He is oriented to person, place, and time. He appears well-developed and well-nourished. No distress.  HENT:  Head: Normocephalic and atraumatic.  Right Ear: External ear  normal.  Left Ear: External ear normal.  Eyes: Conjunctivae are normal. Right eye exhibits no discharge. Left eye exhibits no discharge. No scleral icterus.  Neck: No JVD present. No tracheal deviation present.  Pulmonary/Chest: Effort normal.  Neurological: He is alert and oriented to person, place, and time.  Skin: He is not diaphoretic.     Psychiatric: He has a normal mood and affect. His behavior is normal.    BP 124/80   Pulse 70   Ht 6\' 3"  (1.905 m)   Wt 200 lb 2 oz (90.8 kg)   SpO2 98%   BMI 25.01 kg/m  Wt Readings from Last 3 Encounters:  12/26/18 200 lb 2 oz (90.8 kg)  12/16/18 201 lb (91.2 kg)  12/13/18 201 lb (91.2 kg)   BP Readings from Last 3 Encounters:  12/26/18 124/80  12/16/18 (!) 152/88  12/13/18 126/72   Guideline developer:  UpToDate (see UpToDate for funding source) Date Released: June 2014  Health Maintenance Due  Topic Date Due  . INFLUENZA VACCINE  10/21/2018    There are no preventive care reminders to display for this patient.  Lab Results  Component Value Date   TSH 0.69 02/15/2017   Lab Results  Component Value Date   WBC 5.1 09/21/2018   HGB 14.2 09/21/2018   HCT 42.3 09/21/2018   MCV 91.8 09/21/2018   PLT 175.0 09/21/2018   Lab Results  Component Value Date   NA 143 09/21/2018   K 4.0 09/21/2018   CO2 30 09/21/2018   GLUCOSE 95 09/21/2018   BUN 18 09/21/2018   CREATININE 1.07 09/21/2018   BILITOT 0.7 09/21/2018   ALKPHOS 56 09/21/2018   AST 24 09/21/2018   ALT 33 09/21/2018   PROT 6.7 09/21/2018   ALBUMIN 4.6 09/21/2018   CALCIUM 9.0 09/21/2018   GFR 70.97 09/21/2018   Lab Results  Component Value Date   CHOL 143 09/21/2018   Lab Results  Component Value Date   HDL 49.40 09/21/2018   Lab Results  Component Value Date   LDLCALC 76 09/21/2018   Lab Results  Component Value Date   TRIG 87.0 09/21/2018   Lab Results  Component Value Date   CHOLHDL 3 09/21/2018   No results found for: HGBA1C     Assessment & Plan:   Problem List Items Addressed This Visit    None    Visit Diagnoses    Encounter for removal of sutures    -  Primary  No orders of the defined types were placed in this encounter.   Follow-up: Return if symptoms worsen or fail to improve.

## 2019-01-17 MED FILL — PRAVASTATIN NA 40 MG TAB: 40 | 90 days supply | Qty: 90 | Fill #1

## 2019-01-29 NOTE — Progress Notes (Signed)
[name] [address] [place], [place] [address] Phone: [phone] Subjective:   Stephen [name], am serving as a scribe for Dr. Hulan Saas.   CC: Neck and upper back pain follow-up  [id number]  Stephen Calderin. is a 58 y.o. male coming in with complaint of neck and upper back pain. Last seen on 12/13/2018. Patient states that he has not had any issues since last visit. Is here for OMT to manage his upper back and neck pain.      Past Medical History:  Diagnosis Date  . Colon polyps   . Detached retina, right 1992   Dr. [name] at [facility] did orbital band procedure with good results.   . High cholesterol   . Hypertension   . Poison ivy dermatitis 07/29/2015   Past Surgical History:  Procedure Laterality Date  . RETINAL DETACHMENT SURGERY     Social History   Socioeconomic History  . Marital status: Married    Spouse name: Not on file  . Number of children: 2  . Years of education: 51  . Highest education level: Not on file  Occupational History  . Occupation: busines    Employer: [employer]  Social Needs  . Financial resource strain: Not on file  . Food insecurity    Worry: Not on file    Inability: Not on file  . Transportation needs    Medical: Not on file    Non-medical: Not on file  Tobacco Use  . Smoking status: Never Smoker  . Smokeless tobacco: Never Used  Substance and Sexual Activity  . Alcohol use: Yes    Alcohol/week: 1.0 standard drinks    Types: 1 Standard drinks or equivalent per week  . Drug use: [name]  . Sexual activity: Yes    Partners: Female  Lifestyle  . Physical activity    Days per week: Not on file    Minutes per session: Not on file  . Stress: Not on file  Relationships  . Social Herbalist on phone: Not on file    Gets together: Not on file    Attends religious service: Not on file    Active member of club or organization: Not on file    Attends meetings of clubs or organizations:  Not on file    Relationship status: Not on file  Other Topics Concern  . Not on file  Social History Narrative   HSG, [employer] - BS business admin. Married '84 - 1 son '86, 1 dtr -'27. 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 ACP discussed and he is referred to [email or web address].   [name] Known Allergies Family History  Problem Relation Age of Onset  . Parkinsonism Mother   . Cancer Father        colon cancer with mets  . Hyperlipidemia Father   . Hypertension Father   . Hypertension Brother   . Diabetes Neg Hx   . Heart disease Neg Hx      Current Outpatient Medications (Cardiovascular):  .  lisinopril (ZESTRIL) 20 MG tablet, TAKE 1 TABLET BY MOUTH ONCE DAILY .  pravastatin (PRAVACHOL) 40 MG tablet, TAKE 1 TABLET BY MOUTH AT BEDTIME.     Current Outpatient Medications (Other):  .  fish oil-omega-3 fatty acids 1000 MG capsule, Take 2 g by mouth daily.   .  Multiple Vitamin (MULTIVITAMIN) tablet, Take 1 tablet by mouth daily.   Marland Kitchen  venlafaxine (EFFEXOR) 37.5 MG tablet, Take 37.5 mg by mouth 2 (two) times daily. Marland Kitchen  venlafaxine XR (EFFEXOR-XR) 37.5 MG 24 hr capsule, TAKE 2 CAPSULES BY MOUTH DAILY WITH BREAKFAST    Past medical history, social, surgical and family history all reviewed in electronic medical record.  [name] pertanent information unless stated regarding to the chief complaint.   Review of Systems:  [name] headache, visual changes, nausea, vomiting, diarrhea, constipation, dizziness, abdominal pain, skin rash, fevers, chills, night sweats, weight loss, swollen lymph nodes, body aches, joint swelling,  chest pain, shortness of breath, mood changes.  Positive muscle aches  Objective  Blood pressure 124/80, pulse 78, height 6\' 3"  (1.905 m), SpO2 98 %.   General: [name] apparent distress alert and oriented x3 mood and affect normal, dressed appropriately.  HEENT: Pupils equal, extraocular movements intact   Respiratory: Patient's speak in full sentences and does not appear short of breath  Cardiovascular: [name] lower extremity edema, non tender, [name] erythema  Skin: Warm dry intact with [name] signs of infection or rash on extremities or on axial skeleton.  Abdomen: Soft nontender  Neuro: Cranial nerves II through XII are intact, neurovascularly intact in all extremities with 2+ DTRs and 2+ pulses.  Lymph: [name] lymphadenopathy of posterior or anterior cervical chain or axillae bilaterally.  Gait normal with good balance and coordination.  MSK:  tender with full range of motion and good stability and symmetric strength and tone of shoulders, elbows, wrist, hip, knee and ankles bilaterally.  Neck exam TTTP, Negative spurling. Mild limited ROM in all plaes, tightness in the muscle between shoulder blades.   Osteopathic findings   C6 flexed rotated and side bent left T8 extended rotated and side bent left L2 flexed rotated and side bent right Sacrum right on right    Impression and Recommendations:     This case required medical decision making of moderate complexity. The above documentation has been reviewed and is accurate and complete Lyndal Pulley, DO       Note: This dictation was prepared with Dragon dictation along with smaller phrase technology. Any transcriptional errors that result from this process are unintentional.

## 2019-01-30 ENCOUNTER — Encounter: Payer: Self-pay | Admitting: Family Medicine

## 2019-01-30 ENCOUNTER — Other Ambulatory Visit: Payer: Self-pay

## 2019-01-30 ENCOUNTER — Ambulatory Visit: Payer: 59 | Admitting: Family Medicine

## 2019-01-30 VITALS — BP 124/80 | HR 78 | Ht 75.0 in

## 2019-01-30 DIAGNOSIS — M5412 Radiculopathy, cervical region: Secondary | ICD-10-CM | POA: Diagnosis not present

## 2019-01-30 DIAGNOSIS — M999 Biomechanical lesion, unspecified: Secondary | ICD-10-CM | POA: Diagnosis not present

## 2019-01-30 NOTE — Assessment & Plan Note (Signed)
No longer having any type of radicular symptoms.  More of the muscle imbalances that are likely contributing to most of the discomfort and pain.  Discussed with patient about ergonomics throughout work.  Patient is responding very well to osteopathic manipulation.  Follow-up again in 4 to 8 weeks.

## 2019-01-30 NOTE — Patient Instructions (Signed)
Continue what you are doing Happy [place] Day See me in 6 weeks

## 2019-01-30 NOTE — Assessment & Plan Note (Addendum)
Decision today to treat with OMT was based on Physical Exam  After verbal consent patient was treated with HVLA, ME, FPR techniques in cervical, thoracic, lumbar and sacral areas  Patient tolerated the procedure well with improvement in symptoms  Patient given exercises, stretches and lifestyle modifications  See medications in patient instructions if given  Patient will follow up in 4-6 weeks 

## 2019-01-31 MED FILL — VENLAFAXINE HCL ER 37.5 MG: 37.5 | 90 days supply | Qty: 180 | Fill #1

## 2019-02-19 ENCOUNTER — Other Ambulatory Visit: Payer: Self-pay | Admitting: Family Medicine

## 2019-02-19 DIAGNOSIS — I10 Essential (primary) hypertension: Secondary | ICD-10-CM

## 2019-02-19 MED FILL — LISINOPRIL 20 MG TABLET: 20 | 90 days supply | Qty: 90 | Fill #0

## 2019-03-14 ENCOUNTER — Encounter: Payer: Self-pay | Admitting: Family Medicine

## 2019-03-14 ENCOUNTER — Other Ambulatory Visit: Payer: Self-pay

## 2019-03-14 ENCOUNTER — Ambulatory Visit (INDEPENDENT_AMBULATORY_CARE_PROVIDER_SITE_OTHER): Payer: 59 | Admitting: Family Medicine

## 2019-03-14 VITALS — BP 100/70 | HR 71 | Ht 75.0 in | Wt 204.0 lb

## 2019-03-14 DIAGNOSIS — M5412 Radiculopathy, cervical region: Secondary | ICD-10-CM

## 2019-03-14 DIAGNOSIS — M999 Biomechanical lesion, unspecified: Secondary | ICD-10-CM | POA: Diagnosis not present

## 2019-03-14 NOTE — Patient Instructions (Signed)
Good to see you.  Doing awesome! Happy Holidays! See me again in 6-7 weeks

## 2019-03-14 NOTE — Progress Notes (Signed)
[name] [place], [place] [address] Phone: [phone] Subjective:   I Stephen [name] am serving as a Education administrator for Dr. Hulan Saas.   This visit occurred during the SARS-CoV-2 public health emergency.  Safety protocols were in place, including screening questions prior to the visit, additional usage of staff PPE, and extensive cleaning of exam room while observing appropriate contact time as indicated for disinfecting solutions.      CC: Neck pain follow-up  [id number]  Stephen Orloff. is a 58 y.o. male coming in with complaint of back pain. Patient states he is doing well.  Patient recently moved to a new house.  Has been moving some boxes but is feeling relatively good.  Patient denies any radiation of the arm, feels like the medications has been helpful as well.  No new symptoms.      Past Medical History:  Diagnosis Date  . Colon polyps   . Detached retina, right 1992   Dr. [name] at [facility] did orbital band procedure with good results.   . High cholesterol   . Hypertension   . Poison ivy dermatitis 07/29/2015   Past Surgical History:  Procedure Laterality Date  . RETINAL DETACHMENT SURGERY     Social History   Socioeconomic History  . Marital status: Married    Spouse name: Not on file  . Number of children: 2  . Years of education: 66  . Highest education level: Not on file  Occupational History  . Occupation: busines    Employer: [employer]  Tobacco Use  . Smoking status: Never Smoker  . Smokeless tobacco: Never Used  Substance and Sexual Activity  . Alcohol use: Yes    Alcohol/week: 1.0 standard drinks    Types: 1 Standard drinks or equivalent per week  . Drug use: No  . Sexual activity: Yes    Partners: Female  Other Topics Concern  . Not on file  Social History Narrative   HSG, [employer] - BS business admin. Married '84 - 1 son '86, 1 dtr -'27. 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 Marriage is in good health. ACP discussed and he is referred to [email or web address].   Social Determinants of Health   Financial Resource Strain:   . Difficulty of Paying Living Expenses: Not on file  Food Insecurity:   . Worried About Charity fundraiser in the Last Year: Not on file  . Ran Out of Food in the Last Year: Not on file  Transportation Needs:   . Lack of Transportation (Medical): Not on file  . Lack of Transportation (Non-Medical): Not on file  Physical Activity:   . Days of Exercise per Week: Not on file  . Minutes of Exercise per Session: Not on file  Stress:   . Feeling of Stress : Not on file  Social Connections:   . Frequency of Communication with Friends and Family: Not on file  . Frequency of Social Gatherings with Friends and Family: Not on file  . Attends Religious Services: Not on file  . Active Member of Clubs or Organizations: Not on file  . Attends Archivist Meetings: Not on file  . Marital Status: Not on file   No Known Allergies Family History  Problem Relation Age of Onset  . Parkinsonism Mother   . Cancer Father        colon cancer with mets  . Hyperlipidemia Father   .  Hypertension Father   . Hypertension Brother   . Diabetes Neg Hx   . Heart disease Neg Hx      Current Outpatient Medications (Cardiovascular):  .  lisinopril (ZESTRIL) 20 MG tablet, TAKE 1 TABLET BY MOUTH ONCE DAILY .  pravastatin (PRAVACHOL) 40 MG tablet, TAKE 1 TABLET BY MOUTH AT BEDTIME.     Current Outpatient Medications (Other):  .  fish oil-omega-3 fatty acids 1000 MG capsule, Take 2 g by mouth daily.   .  Multiple Vitamin (MULTIVITAMIN) tablet, Take 1 tablet by mouth daily.   Marland Kitchen  venlafaxine (EFFEXOR) 37.5 MG tablet, Take 37.5 mg by mouth 2 (two) times daily. Marland Kitchen  venlafaxine XR (EFFEXOR-XR) 37.5 MG 24 hr capsule, TAKE 2 CAPSULES BY MOUTH DAILY WITH BREAKFAST    Past medical history, social, surgical and family history all  reviewed in electronic medical record.  No pertanent information unless stated regarding to the chief complaint.   Review of Systems:  No headache, visual changes, nausea, vomiting, diarrhea, constipation, dizziness, abdominal pain, skin rash, fevers, chills, night sweats, weight loss, swollen lymph nodes, body aches, joint swelling, muscle aches, chest pain, shortness of breath, mood changes.   Objective  Blood pressure 100/70, pulse 71, height 6\' 3"  (1.905 m), weight 204 lb (92.5 kg), SpO2 98 %.    General: No apparent distress alert and oriented x3 mood and affect normal, dressed appropriately.  HEENT: Pupils equal, extraocular movements intact  Respiratory: Patient's speak in full sentences and does not appear short of breath  Cardiovascular: No lower extremity edema, non tender, no erythema  Skin: Warm dry intact with no signs of infection or rash on extremities or on axial skeleton.  Abdomen: Soft nontender  Neuro: Cranial nerves II through XII are intact, neurovascularly intact in all extremities with 2+ DTRs and 2+ pulses.  Lymph: No lymphadenopathy of posterior or anterior cervical chain or axillae bilaterally.  Gait normal with good balance and coordination.  MSK:  Non tender with full range of motion and good stability and symmetric strength and tone of shoulders, elbows, wrist, hip, knee and ankles bilaterally.  Neck: Inspection mild loss of lordosis. No palpable stepoffs. Negative Spurling's maneuver. Limited range of motion in all planes lacking last 10 degrees of sidebending and extension. Grip strength and sensation normal in bilateral hands Strength good C4 to T1 distribution No sensory change to C4 to T1 Negative Hoffman sign bilaterally Reflexes normal  Osteopathic findings  C2 flexed rotated and side bent right T9 extended rotated and side bent left L2 flexed rotated and side bent right Sacrum right on right    Impression and Recommendations:     This case  required medical decision making of moderate complexity. The above documentation has been reviewed and is accurate and complete Stephen Pulley, DO       Note: This dictation was prepared with Dragon dictation along with smaller phrase technology. Any transcriptional errors that result from this process are unintentional.

## 2019-03-14 NOTE — Assessment & Plan Note (Signed)
Decision today to treat with OMT was based on Physical Exam  After verbal consent patient was treated with HVLA, ME, FPR techniques in cervical, thoracic, lumbar and sacral areas  Patient tolerated the procedure well with improvement in symptoms  Patient given exercises, stretches and lifestyle modifications  See medications in patient instructions if given  Patient will follow up in 4-8 weeks 

## 2019-03-14 NOTE — Assessment & Plan Note (Signed)
Cervical radiculopathy.  Discussed with patient in great length again.  Has done really well.  No change in medications at the moment.  Discussed icing regimen.  Follow-up again in 4 to 8 weeks

## 2019-04-19 ENCOUNTER — Other Ambulatory Visit: Payer: Self-pay | Admitting: Family Medicine

## 2019-04-19 DIAGNOSIS — E782 Mixed hyperlipidemia: Secondary | ICD-10-CM

## 2019-04-19 MED FILL — PRAVASTATIN NA 40 MG TAB: 40 | 90 days supply | Qty: 90 | Fill #0

## 2019-05-02 ENCOUNTER — Encounter: Payer: Self-pay | Admitting: Family Medicine

## 2019-05-02 ENCOUNTER — Ambulatory Visit (INDEPENDENT_AMBULATORY_CARE_PROVIDER_SITE_OTHER): Payer: 59 | Admitting: Family Medicine

## 2019-05-02 ENCOUNTER — Other Ambulatory Visit: Payer: Self-pay

## 2019-05-02 VITALS — BP 118/76 | HR 75 | Ht 75.0 in | Wt 204.0 lb

## 2019-05-02 DIAGNOSIS — M999 Biomechanical lesion, unspecified: Secondary | ICD-10-CM

## 2019-05-02 DIAGNOSIS — M5412 Radiculopathy, cervical region: Secondary | ICD-10-CM | POA: Diagnosis not present

## 2019-05-02 NOTE — Assessment & Plan Note (Signed)
Chronic problem but stable.  Discussed ergonomics, discussed medications including the Effexor which patient seems to be doing well at the moment to 37.5 mg.  Discussed medication management.  Patient is going to continue to work on ergonomics and follow-up with me again 4 to 8 weeks.

## 2019-05-02 NOTE — Patient Instructions (Signed)
Posture is key See me in 7 weeks

## 2019-05-02 NOTE — Assessment & Plan Note (Signed)
Decision today to treat with OMT was based on Physical Exam  After verbal consent patient was treated with HVLA, ME, FPR techniques in cervical, thoracic, lumbar and sacral areas  Patient tolerated the procedure well with improvement in symptoms  Patient given exercises, stretches and lifestyle modifications  See medications in patient instructions if given  Patient will follow up in 4-8 weeks 

## 2019-05-02 NOTE — Progress Notes (Signed)
[place] Phone: [phone] Subjective:   Stephen [name], am serving as a scribe for Dr. Hulan Saas. This visit occurred during the SARS-CoV-2 public health emergency.  Safety protocols were in place, including screening questions prior to the visit, additional usage of staff PPE, and extensive cleaning of exam room while observing appropriate contact time as indicated for disinfecting solutions.   I'm seeing this patient by the request  of:  [name], MD  CC: Neck pain follow-up  [id number]  Stephen Anness. is a 59 y.o. male coming in with complaint of neck pain. Last seen on 03/14/2019 for OMT. Patient states overall doing relatively well.  Patient states not any significant pain, [name] radiation of pain at all.      Past Medical History:  Diagnosis Date  . Colon polyps   . Detached retina, right 1992   Dr. [name] at [facility] did orbital band procedure with good results.   . High cholesterol   . Hypertension   . Poison ivy dermatitis 07/29/2015   Past Surgical History:  Procedure Laterality Date  . RETINAL DETACHMENT SURGERY     Social History   Socioeconomic History  . Marital status: Married    Spouse name: Not on file  . Number of children: 2  . Years of education: 30  . Highest education level: Not on file  Occupational History  . Occupation: busines    Employer: [employer]  Tobacco Use  . Smoking status: Never Smoker  . Smokeless tobacco: Never Used  Substance and Sexual Activity  . Alcohol use: Yes    Alcohol/week: 1.0 standard drinks    Types: 1 Standard drinks or equivalent per week  . Drug use: [name]  . Sexual activity: Yes    Partners: Female  Other Topics Concern  . Not on file  Social History Narrative   HSG, [employer] - BS business admin. Married '84 - 1 son '86, 1 dtr -'58. 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 Marriage  is in good health. ACP discussed and he is referred to [email or web address].   Social Determinants of Health   Financial Resource Strain:   . Difficulty of Paying Living Expenses: Not on file  Food Insecurity:   . Worried About Charity fundraiser in the Last Year: Not on file  . Ran Out of Food in the Last Year: Not on file  Transportation Needs:   . Lack of Transportation (Medical): Not on file  . Lack of Transportation (Non-Medical): Not on file  Physical Activity:   . Days of Exercise per Week: Not on file  . Minutes of Exercise per Session: Not on file  Stress:   . Feeling of Stress : Not on file  Social Connections:   . Frequency of Communication with Friends and Family: Not on file  . Frequency of Social Gatherings with Friends and Family: Not on file  . Attends Religious Services: Not on file  . Active Member of Clubs or Organizations: Not on file  . Attends Archivist Meetings: Not on file  . Marital Status: Not on file   [name] Known Allergies Family History  Problem Relation Age of Onset  . Parkinsonism Mother   . Cancer Father        colon cancer with mets  . Hyperlipidemia Father   . Hypertension Father   . Hypertension Brother   . Diabetes Neg  Hx   . Heart disease Neg Hx      Current Outpatient Medications (Cardiovascular):  .  lisinopril (ZESTRIL) 20 MG tablet, TAKE 1 TABLET BY MOUTH ONCE DAILY .  pravastatin (PRAVACHOL) 40 MG tablet, TAKE 1 TABLET BY MOUTH AT BEDTIME.     Current Outpatient Medications (Other):  .  fish oil-omega-3 fatty acids 1000 MG capsule, Take 2 g by mouth daily.   .  Multiple Vitamin (MULTIVITAMIN) tablet, Take 1 tablet by mouth daily.   Marland Kitchen  venlafaxine (EFFEXOR) 37.5 MG tablet, Take 37.5 mg by mouth 2 (two) times daily. Marland Kitchen  venlafaxine XR (EFFEXOR-XR) 37.5 MG 24 hr capsule, TAKE 2 CAPSULES BY MOUTH DAILY WITH BREAKFAST   Reviewed prior external information including notes and imaging from  primary care provider  As well as notes that were available from care everywhere and other healthcare systems.  Past medical history, social, surgical and family history all reviewed in electronic medical record.  [name] pertanent information unless stated regarding to the chief complaint.   Review of Systems:  [name] headache, visual changes, nausea, vomiting, diarrhea, constipation, dizziness, abdominal pain, skin rash, fevers, chills, night sweats, weight loss, swollen lymph nodes, body aches, joint swelling, chest pain, shortness of breath, mood changes. POSITIVE muscle aches  Objective  There were [name] vitals taken for this visit.   General: [name] apparent distress alert and oriented x3 mood and affect normal, dressed appropriately.  HEENT: Pupils equal, extraocular movements intact  Respiratory: Patient's speak in full sentences and does not appear short of breath  Cardiovascular: [name] lower extremity edema, non tender, [name] erythema  Skin: Warm dry intact with [name] signs of infection or rash on extremities or on axial skeleton.  Abdomen: Soft nontender  Neuro: Cranial nerves II through XII are intact, neurovascularly intact in all extremities with 2+ DTRs and 2+ pulses.  Lymph: [name] lymphadenopathy of posterior or anterior cervical chain or axillae bilaterally.  Gait normal with good balance and coordination.  MSK:  Non tender with full range of motion and good stability and symmetric strength and tone of shoulders, elbows, wrist, hip, knee and ankles bilaterally.  Neck exam does have some loss of lordosis.  Tender to palpation in the paraspinal musculature parascapular region.  Very mild scapular dyskinesis noted today.  Mild new symptom.  Pain with Spurling's but [name] radicular symptoms.  5 out of 5 strength of the upper extremities bilaterally and deep tendon reflexes appear to be intact.   Osteopathic findings C2 flexed rotated and side bent right C4 flexed rotated and side bent left T5 extended rotated and side bent left  L2 flexed rotated and side bent right Sacrum right on right    Impression and Recommendations:     This case required medical decision making of moderate complexity. The above documentation has been reviewed and is accurate and complete Stephen Pulley, DO       Note: This dictation was prepared with Dragon dictation along with smaller phrase technology. Any transcriptional errors that result from this process are unintentional.

## 2019-05-04 ENCOUNTER — Other Ambulatory Visit: Payer: Self-pay | Admitting: Family Medicine

## 2019-05-04 MED FILL — VENLAFAXINE HCL ER 37.5 MG: 37.5 | 90 days supply | Qty: 180 | Fill #0

## 2019-05-18 MED FILL — LISINOPRIL 20 MG TABS: 20 | 90 days supply | Qty: 90 | Fill #1

## 2019-06-20 ENCOUNTER — Other Ambulatory Visit: Payer: Self-pay

## 2019-06-20 ENCOUNTER — Ambulatory Visit: Payer: 59 | Admitting: Family Medicine

## 2019-06-20 ENCOUNTER — Encounter: Payer: Self-pay | Admitting: Family Medicine

## 2019-06-20 VITALS — BP 134/62 | HR 74 | Ht 75.0 in | Wt 211.0 lb

## 2019-06-20 DIAGNOSIS — M5412 Radiculopathy, cervical region: Secondary | ICD-10-CM | POA: Diagnosis not present

## 2019-06-20 DIAGNOSIS — M999 Biomechanical lesion, unspecified: Secondary | ICD-10-CM

## 2019-06-20 MED ORDER — TIZANIDINE HCL 4 MG PO CAPS: ORAL_CAPSULE | ORAL | 0 refills | Status: DC

## 2019-06-20 MED FILL — tiZANidine HCL 4 MG TABS: 4 | 30 days supply | Qty: 30 | Fill #0

## 2019-06-20 NOTE — Patient Instructions (Addendum)
Good to see you Zanaflex nightly for the first 3 nights See me again in 4-6 weeks

## 2019-06-20 NOTE — Assessment & Plan Note (Signed)
Chronic problem : Chronic problem but seems to be relatively stable.  interventions previously, including medication management: Doing well with chronic medications including the Effexor.  Also doing well with manipulation and has done physical therapy   Interventions this visit: Osteopathic manipulation.  Encourage continue the same dosing of the Effexor at this time. We discussed with patient the importance ergonomics, home exercises, icing regimen, and over-the-counter natural products.     Return to clinic: 6 to 8 weeks

## 2019-06-20 NOTE — Assessment & Plan Note (Signed)
   Decision today to treat with OMT was based on Physical Exam  After verbal consent patient was treated with HVLA, ME, FPR techniques in cervical, thoracic,  lumbar and sacral areas, all areas are chronic   Patient tolerated the procedure well with improvement in symptoms  Patient given exercises, stretches and lifestyle modifications  See medications in patient instructions if given  Patient will follow up in 4-8 weeks 

## 2019-06-20 NOTE — Progress Notes (Signed)
[facility] [address] [place] Phone: [phone] Subjective:   I Stephen [name] am serving as a Education administrator for Dr. Hulan Saas.  This visit occurred during the SARS-CoV-2 public health emergency.  Safety protocols were in place, including screening questions prior to the visit, additional usage of staff PPE, and extensive cleaning of exam room while observing appropriate contact time as indicated for disinfecting solutions.   I'm seeing this patient by the request  of:  [name], MD  CC: Neck and back pain follow-up  [id number]  Stephen [name]. is a 59 y.o. male coming in with complaint of neck and back pain. Last seen 05/02/2019 for OMT. Patient states he is a little stiff today. Notices more pain on the right side of the neck.  Discussed with patient icing regimen and home exercise, which activities to do which was to avoid.  Patient is to increase activity slowly.  Has not been doing them as regularly as usual.    Past Medical History:  Diagnosis Date  . Colon polyps   . Detached retina, right 1992   Dr. [name] at [facility] did orbital band procedure with good results.   . High cholesterol   . Hypertension   . Poison ivy dermatitis 07/29/2015   Past Surgical History:  Procedure Laterality Date  . RETINAL DETACHMENT SURGERY     Social History   Socioeconomic History  . Marital status: Married    Spouse name: Not on file  . Number of children: 2  . Years of education: 106  . Highest education level: Not on file  Occupational History  . Occupation: busines    Employer: [employer]  Tobacco Use  . Smoking status: Never Smoker  . Smokeless tobacco: Never Used  Substance and Sexual Activity  . Alcohol use: Yes    Alcohol/week: 1.0 standard drinks    Types: 1 Standard drinks or equivalent per week  . Drug use: No  . Sexual activity: Yes    Partners: Female  Other Topics Concern  . Not on file  Social History  Narrative   HSG, [employer] - BS business admin. Married '84 - 1 son '86, 1 dtr -'22. 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 ACP discussed and he is referred to [email or web address].   Social Determinants of Health   Financial Resource Strain:   . Difficulty of Paying Living Expenses:   Food Insecurity:   . Worried About Charity fundraiser in the Last Year:   . Arboriculturist in the Last Year:   Transportation Needs:   . Film/video editor (Medical):   Marland Kitchen Lack of Transportation (Non-Medical):   Physical Activity:   . Days of Exercise per Week:   . Minutes of Exercise per Session:   Stress:   . Feeling of Stress :   Social Connections:   . Frequency of Communication with Friends and Family:   . Frequency of Social Gatherings with Friends and Family:   . Attends Religious Services:   . Active Member of Clubs or Organizations:   . Attends Archivist Meetings:   Marland Kitchen Marital Status:    No Known Allergies Family History  Problem Relation Age of Onset  . Parkinsonism Mother   . Cancer Father        colon cancer with mets  . Hyperlipidemia Father   . Hypertension Father   .  Hypertension Brother   . Diabetes Neg Hx   . Heart disease Neg Hx      Current Outpatient Medications (Cardiovascular):  .  lisinopril (ZESTRIL) 20 MG tablet, TAKE 1 TABLET BY MOUTH ONCE DAILY .  pravastatin (PRAVACHOL) 40 MG tablet, TAKE 1 TABLET BY MOUTH AT BEDTIME.     Current Outpatient Medications (Other):  .  fish oil-omega-3 fatty acids 1000 MG capsule, Take 2 g by mouth daily.   .  Multiple Vitamin (MULTIVITAMIN) tablet, Take 1 tablet by mouth daily.   Marland Kitchen  venlafaxine (EFFEXOR) 37.5 MG tablet, Take 37.5 mg by mouth 2 (two) times daily. Marland Kitchen  venlafaxine XR (EFFEXOR-XR) 37.5 MG 24 hr capsule, TAKE 2 CAPSULES BY MOUTH DAILY WITH BREAKFAST .  tiZANidine (ZANAFLEX) 4 MG capsule, 1 tablet at night   Reviewed prior  external information including notes and imaging from  primary care provider As well as notes that were available from care everywhere and other healthcare systems.  Past medical history, social, surgical and family history all reviewed in electronic medical record.  No pertanent information unless stated regarding to the chief complaint.   Review of Systems:  No  visual changes, nausea, vomiting, diarrhea, constipation, dizziness, abdominal pain, skin rash, fevers, chills, night sweats, weight loss, swollen lymph nodes, body aches, joint swelling, chest pain, shortness of breath, mood changes. POSITIVE muscle aches, headache  Objective  Blood pressure 134/62, pulse 74, height 6\' 3"  (1.905 m), weight 211 lb (95.7 kg), SpO2 96 %.   General: No apparent distress alert and oriented x3 mood and affect normal, dressed appropriately.  HEENT: Pupils equal, extraocular movements intact  Respiratory: Patient's speak in full sentences and does not appear short of breath  Cardiovascular: No lower extremity edema, non tender, no erythema  Neuro: Cranial nerves II through XII are intact, neurovascularly intact in all extremities with 2+ DTRs and 2+ pulses.  Gait normal with good balance and coordination.  MSK:  Non tender with full range of motion and good stability and symmetric strength and tone of shoulders, elbows, wrist, hip, knee and ankles bilaterally.  Neck exam does have some decrease in sidebending and rotation to the right.  No radicular symptoms though which is an improvement.  5 out of 5 strength of the upper extremities.  Does have some lack of the extension of the neck as well by 5 to 10 degrees  Osteopathic findings C2 flexed rotated and side bent right C4 flexed rotated and side bent left T9 extended rotated and side bent left L2 flexed rotated and side bent right Sacrum right on right    Impression and Recommendations:     This case required medical decision making of moderate  complexity. The above documentation has been reviewed and is accurate and complete Stephen Pulley, DO       Note: This dictation was prepared with Dragon dictation along with smaller phrase technology. Any transcriptional errors that result from this process are unintentional.

## 2019-07-16 MED FILL — PRAVASTATIN NA 40 MG TAB: 40 | 90 days supply | Qty: 90 | Fill #1

## 2019-08-03 ENCOUNTER — Encounter: Payer: Self-pay | Admitting: Family Medicine

## 2019-08-03 ENCOUNTER — Other Ambulatory Visit: Payer: Self-pay

## 2019-08-03 ENCOUNTER — Ambulatory Visit: Payer: 59 | Admitting: Family Medicine

## 2019-08-03 DIAGNOSIS — M5412 Radiculopathy, cervical region: Secondary | ICD-10-CM

## 2019-08-03 DIAGNOSIS — M999 Biomechanical lesion, unspecified: Secondary | ICD-10-CM | POA: Diagnosis not present

## 2019-08-03 NOTE — Assessment & Plan Note (Signed)
Chronic problem but stable at the moment.  Discussed lifting mechanics.  Responding well to osteopathic manipulation.  5 out of 5 strength noted today.  Patient is doing very well with all the medications including the Effexor, fish oil, and Zanaflex when needed.  Follow-up with me again in 4 to 8 weeks

## 2019-08-03 NOTE — Patient Instructions (Addendum)
Good to see you Happy Birthday to the grand baby See me again in 6-8 weeks

## 2019-08-03 NOTE — Assessment & Plan Note (Signed)
   Decision today to treat with OMT was based on Physical Exam  After verbal consent patient was treated with HVLA, ME, FPR techniques in cervical, thoracic,  lumbar and sacral areas, all areas are chronic   Patient tolerated the procedure well with improvement in symptoms  Patient given exercises, stretches and lifestyle modifications  See medications in patient instructions if given  Patient will follow up in 4-8 weeks 

## 2019-08-03 NOTE — Progress Notes (Signed)
[facility] [address] [place] Phone: [phone] Subjective:   I Stephen [name] am serving as a Education administrator for Dr. Hulan Saas.  This visit occurred during the SARS-CoV-2 public health emergency.  Safety protocols were in place, including screening questions prior to the visit, additional usage of staff PPE, and extensive cleaning of exam room while observing appropriate contact time as indicated for disinfecting solutions.   I'm seeing this patient by the request  of:  [name], MD  CC: Neck pain follow-up  [id number]  Stephen Ulland. is a 59 y.o. male coming in with complaint of back pain. Last seen 06/20/2019 for OMT. Patient states he is doing well today.  Patient has been doing some more activity with his grandchild but thinks it has exacerbated some of the pain at the moment.     Past Medical History:  Diagnosis Date  . Colon polyps   . Detached retina, right 1992   Dr. [name] at [facility] did orbital band procedure with good results.   . High cholesterol   . Hypertension   . Poison ivy dermatitis 07/29/2015   Past Surgical History:  Procedure Laterality Date  . RETINAL DETACHMENT SURGERY     Social History   Socioeconomic History  . Marital status: Married    Spouse name: Not on file  . Number of children: 2  . Years of education: 62  . Highest education level: Not on file  Occupational History  . Occupation: busines    Employer: [employer]  Tobacco Use  . Smoking status: Never Smoker  . Smokeless tobacco: Never Used  Substance and Sexual Activity  . Alcohol use: Yes    Alcohol/week: 1.0 standard drinks    Types: 1 Standard drinks or equivalent per week  . Drug use: No  . Sexual activity: Yes    Partners: Female  Other Topics Concern  . Not on file  Social History Narrative   HSG, [employer] - BS business admin. Married '84 - 1 son '86, 1 dtr -'66. 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 Marriage is in good health. ACP discussed and he is referred to [email or web address].   Social Determinants of Health   Financial Resource Strain:   . Difficulty of Paying Living Expenses:   Food Insecurity:   . Worried About Charity fundraiser in the Last Year:   . Arboriculturist in the Last Year:   Transportation Needs:   . Film/video editor (Medical):   Marland Kitchen Lack of Transportation (Non-Medical):   Physical Activity:   . Days of Exercise per Week:   . Minutes of Exercise per Session:   Stress:   . Feeling of Stress :   Social Connections:   . Frequency of Communication with Friends and Family:   . Frequency of Social Gatherings with Friends and Family:   . Attends Religious Services:   . Active Member of Clubs or Organizations:   . Attends Archivist Meetings:   Marland Kitchen Marital Status:    No Known Allergies Family History  Problem Relation Age of Onset  . Parkinsonism Mother   . Cancer Father        colon cancer with mets  . Hyperlipidemia Father   . Hypertension Father   . Hypertension Brother   . Diabetes Neg Hx   . Heart disease Neg Hx      Current Outpatient Medications (Cardiovascular):  .  lisinopril (ZESTRIL) 20 MG tablet, TAKE 1 TABLET BY MOUTH ONCE DAILY .  pravastatin (PRAVACHOL) 40 MG tablet, TAKE 1 TABLET BY MOUTH AT BEDTIME.     Current Outpatient Medications (Other):  .  fish oil-omega-3 fatty acids 1000 MG capsule, Take 2 g by mouth daily.   .  Multiple Vitamin (MULTIVITAMIN) tablet, Take 1 tablet by mouth daily.   Marland Kitchen  tiZANidine (ZANAFLEX) 4 MG capsule, 1 tablet at night .  venlafaxine (EFFEXOR) 37.5 MG tablet, Take 37.5 mg by mouth 2 (two) times daily. Marland Kitchen  venlafaxine XR (EFFEXOR-XR) 37.5 MG 24 hr capsule, TAKE 2 CAPSULES BY MOUTH DAILY WITH BREAKFAST   Reviewed prior external information including notes and imaging from  primary care provider As well as notes that were available from  care everywhere and other healthcare systems.  Past medical history, social, surgical and family history all reviewed in electronic medical record.  No pertanent information unless stated regarding to the chief complaint.   Review of Systems:  No headache, visual changes, nausea, vomiting, diarrhea, constipation, dizziness, abdominal pain, skin rash, fevers, chills, night sweats, weight loss, swollen lymph nodes, body aches, joint swelling, chest pain, shortness of breath, mood changes. POSITIVE muscle aches  Objective  Blood pressure 120/60, pulse 70, height 6\' 3"  (1.905 m), weight 211 lb (95.7 kg), SpO2 97 %.   General: No apparent distress alert and oriented x3 mood and affect normal, dressed appropriately.  HEENT: Pupils equal, extraocular movements intact  Respiratory: Patient's speak in full sentences and does not appear short of breath  Cardiovascular: No lower extremity edema, non tender, no erythema  Neuro: Cranial nerves II through XII are intact, neurovascularly intact in all extremities with 2+ DTRs and 2+ pulses.  Gait normal with good balance and coordination.  MSK:  Non tender with full range of motion and good stability and symmetric strength and tone of shoulders, elbows, wrist, hip, knee and ankles bilaterally.  Neck exam does have some loss of lordosis.  Tenderness to palpation in the parascapular region.  Negative Spurling's at the moment.  Osteopathic findings  C2 flexed rotated and side bent right C4 flexed rotated and side bent left T9 extended rotated and side bent left L4 flexed rotated and side bent left Sacrum right on right    Impression and Recommendations:     This case required medical decision making of moderate complexity. The above documentation has been reviewed and is accurate and complete Lyndal Pulley, DO       Note: This dictation was prepared with Dragon dictation along with smaller phrase technology. Any transcriptional errors that  result from this process are unintentional.

## 2019-08-14 ENCOUNTER — Other Ambulatory Visit: Payer: Self-pay | Admitting: Family Medicine

## 2019-08-14 DIAGNOSIS — I10 Essential (primary) hypertension: Secondary | ICD-10-CM

## 2019-08-14 MED FILL — LISINOPRIL 20 MG TABS: 20 | 90 days supply | Qty: 90 | Fill #0

## 2019-09-14 ENCOUNTER — Encounter: Payer: Self-pay | Admitting: Family Medicine

## 2019-09-14 ENCOUNTER — Other Ambulatory Visit: Payer: Self-pay

## 2019-09-14 ENCOUNTER — Ambulatory Visit (INDEPENDENT_AMBULATORY_CARE_PROVIDER_SITE_OTHER): Payer: 59 | Admitting: Family Medicine

## 2019-09-14 VITALS — BP 140/90 | HR 62 | Ht 75.0 in | Wt 208.0 lb

## 2019-09-14 DIAGNOSIS — M5412 Radiculopathy, cervical region: Secondary | ICD-10-CM | POA: Diagnosis not present

## 2019-09-14 DIAGNOSIS — M999 Biomechanical lesion, unspecified: Secondary | ICD-10-CM | POA: Diagnosis not present

## 2019-09-14 NOTE — Assessment & Plan Note (Signed)
   Decision today to treat with OMT was based on Physical Exam  After verbal consent patient was treated with HVLA, ME, FPR techniques in cervical, thoracic, rib,areas, all areas are chronic   Patient tolerated the procedure well with improvement in symptoms  Patient given exercises, stretches and lifestyle modifications  See medications in patient instructions if given  Patient will follow up in 4-8 weeks 

## 2019-09-14 NOTE — Progress Notes (Signed)
[facility] [address] [place] Phone: [phone] Subjective:   I [name] am serving as a Education administrator for Dr. Hulan Saas.  This visit occurred during the SARS-CoV-2 public health emergency.  Safety protocols were in place, including screening questions prior to the visit, additional usage of staff PPE, and extensive cleaning of exam room while observing appropriate contact time as indicated for disinfecting solutions.   I'm seeing this patient by the request  of:  [name], MD  CC: Neck pain follow-up  [id number]  Stephen [name]. is a 59 y.o. male coming in with complaint of neck pain. Last seen 08/03/2019 for OMT. Patient states he is doing well.  Overall stable at the moment.  Not having anything significant.  No radicular symptoms.  Happy with the result so far.      Past Medical History:  Diagnosis Date  . Colon polyps   . Detached retina, right 1992   Dr. [name] at [facility] did orbital band procedure with good results.   . High cholesterol   . Hypertension   . Poison ivy dermatitis 07/29/2015   Past Surgical History:  Procedure Laterality Date  . RETINAL DETACHMENT SURGERY     Social History   Socioeconomic History  . Marital status: Married    Spouse name: Not on file  . Number of children: 2  . Years of education: 72  . Highest education level: Not on file  Occupational History  . Occupation: busines    Employer: [employer]  Tobacco Use  . Smoking status: Never Smoker  . Smokeless tobacco: Never Used  Vaping Use  . Vaping Use: Never used  Substance and Sexual Activity  . Alcohol use: Yes    Alcohol/week: 1.0 standard drink    Types: 1 Standard drinks or equivalent per week  . Drug use: No  . Sexual activity: Yes    Partners: Female  Other Topics Concern  . Not on file  Social History Narrative   HSG, [employer] - BS business admin. Married '84 - 1 son '86, 1 dtr -'43. 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 Marriage is in good health. ACP discussed and he is referred to [email or web address].   Social Determinants of Health   Financial Resource Strain:   . Difficulty of Paying Living Expenses:   Food Insecurity:   . Worried About Charity fundraiser in the Last Year:   . Arboriculturist in the Last Year:   Transportation Needs:   . Film/video editor (Medical):   Marland Kitchen Lack of Transportation (Non-Medical):   Physical Activity:   . Days of Exercise per Week:   . Minutes of Exercise per Session:   Stress:   . Feeling of Stress :   Social Connections:   . Frequency of Communication with Friends and Family:   . Frequency of Social Gatherings with Friends and Family:   . Attends Religious Services:   . Active Member of Clubs or Organizations:   . Attends Archivist Meetings:   Marland Kitchen Marital Status:    No Known Allergies Family History  Problem Relation Age of Onset  . Parkinsonism Mother   . Cancer Father        colon cancer with mets  . Hyperlipidemia Father   . Hypertension Father   . Hypertension Brother   . Diabetes Neg Hx   . Heart disease Neg Hx  Current Outpatient Medications (Cardiovascular):  .  lisinopril (ZESTRIL) 20 MG tablet, TAKE 1 TABLET BY MOUTH ONCE DAILY .  pravastatin (PRAVACHOL) 40 MG tablet, TAKE 1 TABLET BY MOUTH AT BEDTIME.     Current Outpatient Medications (Other):  .  fish oil-omega-3 fatty acids 1000 MG capsule, Take 2 g by mouth daily.   .  Multiple Vitamin (MULTIVITAMIN) tablet, Take 1 tablet by mouth daily.   Marland Kitchen  tiZANidine (ZANAFLEX) 4 MG capsule, 1 tablet at night .  venlafaxine (EFFEXOR) 37.5 MG tablet, Take 37.5 mg by mouth 2 (two) times daily. Marland Kitchen  venlafaxine XR (EFFEXOR-XR) 37.5 MG 24 hr capsule, TAKE 2 CAPSULES BY MOUTH DAILY WITH BREAKFAST   Reviewed prior external information including notes and imaging from  primary care provider As well as notes  that were available from care everywhere and other healthcare systems.  Past medical history, social, surgical and family history all reviewed in electronic medical record.  No pertanent information unless stated regarding to the chief complaint.   Review of Systems:  No headache, visual changes, nausea, vomiting, diarrhea, constipation, dizziness, abdominal pain, skin rash, fevers, chills, night sweats, weight loss, swollen lymph nodes, body aches, joint swelling, chest pain, shortness of breath, mood changes. POSITIVE muscle aches  Objective  Blood pressure 140/90, pulse 62, height 6\' 3"  (1.905 m), weight 208 lb (94.3 kg), SpO2 97 %.   General: No apparent distress alert and oriented x3 mood and affect normal, dressed appropriately.  HEENT: Pupils equal, extraocular movements intact  Respiratory: Patient's speak in full sentences and does not appear short of breath  Cardiovascular: No lower extremity edema, non tender, no erythema  Neuro: Cranial nerves II through XII are intact, neurovascularly intact in all extremities with 2+ DTRs and 2+ pulses.  Gait normal with good balance and coordination.  MSK:   Neck exam mild loss of lordosis.  Patient doing relatively well with some mild tightness in the parascapular region.  Negative Spurling's.   Osteopathic findings  C2 flexed rotated and side bent right T3 extended rotated and side bent right inhaled third rib      Impression and Recommendations:     The above documentation has been reviewed and is accurate and complete Stephen Pulley, DO       Note: This dictation was prepared with Dragon dictation along with smaller phrase technology. Any transcriptional errors that result from this process are unintentional.

## 2019-09-14 NOTE — Patient Instructions (Signed)
Doing great! Keep working on posture See me again in 6-8 weeks

## 2019-09-14 NOTE — Assessment & Plan Note (Signed)
Continues to improve at this time.  Has Zanaflex, which was Effexor, discussed which activities to do which wants to avoid.  Patient will increase activity as tolerated.  Responds well to manipulation.  Again in 8 weeks

## 2019-10-15 ENCOUNTER — Other Ambulatory Visit: Payer: Self-pay

## 2019-10-15 ENCOUNTER — Other Ambulatory Visit: Payer: Self-pay | Admitting: Family Medicine

## 2019-10-15 DIAGNOSIS — E782 Mixed hyperlipidemia: Secondary | ICD-10-CM

## 2019-10-15 MED ORDER — PRAVASTATIN SODIUM 40 MG PO TABS: 40.0000 mg | ORAL_TABLET | Freq: Every day | ORAL | 0 refills | Status: DC

## 2019-10-15 MED FILL — PRAVASTATIN NA 40 MG TAB: 40 | 90 days supply | Qty: 90 | Fill #0

## 2019-10-24 DIAGNOSIS — H524 Presbyopia: Secondary | ICD-10-CM | POA: Diagnosis not present

## 2019-10-24 DIAGNOSIS — H5211 Myopia, right eye: Secondary | ICD-10-CM | POA: Diagnosis not present

## 2019-10-24 DIAGNOSIS — H52222 Regular astigmatism, left eye: Secondary | ICD-10-CM | POA: Diagnosis not present

## 2019-10-24 DIAGNOSIS — H52221 Regular astigmatism, right eye: Secondary | ICD-10-CM | POA: Diagnosis not present

## 2019-11-01 ENCOUNTER — Other Ambulatory Visit: Payer: Self-pay | Admitting: Family Medicine

## 2019-11-01 MED FILL — VENLAFAXINE HCL ER 37.5 MG: 37.5 | 90 days supply | Qty: 180 | Fill #0

## 2019-11-06 ENCOUNTER — Ambulatory Visit: Payer: 59 | Admitting: Family Medicine

## 2019-11-06 ENCOUNTER — Encounter: Payer: Self-pay | Admitting: Family Medicine

## 2019-11-06 ENCOUNTER — Other Ambulatory Visit: Payer: Self-pay

## 2019-11-06 VITALS — BP 136/82 | HR 68 | Ht 75.0 in | Wt 214.0 lb

## 2019-11-06 DIAGNOSIS — M999 Biomechanical lesion, unspecified: Secondary | ICD-10-CM

## 2019-11-06 DIAGNOSIS — M5412 Radiculopathy, cervical region: Secondary | ICD-10-CM | POA: Diagnosis not present

## 2019-11-06 NOTE — Patient Instructions (Addendum)
Good to see you Overall much better See me again in 7 weeks

## 2019-11-06 NOTE — Assessment & Plan Note (Signed)
Stable overall.  Discussed posture and ergonomics, patient is making progress.  Continue to take the Effexor if it is beneficial.  Discussed icing regimen, discussed posture.  Follow-up again in 4 to 8 weeks

## 2019-11-06 NOTE — Progress Notes (Signed)
[place] [address] [place] Phone: [phone] Subjective:   I [name] am serving as a Education administrator for Dr. Hulan Saas.  This visit occurred during the SARS-CoV-2 public health emergency.  Safety protocols were in place, including screening questions prior to the visit, additional usage of staff PPE, and extensive cleaning of exam room while observing appropriate contact time as indicated for disinfecting solutions.   I'm seeing this patient by the request  of:  [name], MD  CC: Neck pain and back pain follow-up  [id number]  Stephen [name]. is a 59 y.o. male coming in with complaint of back and neck pain Patient states he is doing well today.  Mild tightness but nothing severe.  Feeling better overall.  Medications patient has been prescribed: Effexor, and Zanaflex  Taking: Yes         Reviewed prior external information including notes and imaging from previsou exam, outside providers and external EMR if available.   As well as notes that were available from care everywhere and other healthcare systems.  Past medical history, social, surgical and family history all reviewed in electronic medical record.  No pertanent information unless stated regarding to the chief complaint.   Past Medical History:  Diagnosis Date  . Colon polyps   . Detached retina, right 1992   Dr. [name] at [facility] did orbital band procedure with good results.   . High cholesterol   . Hypertension   . Poison ivy dermatitis 07/29/2015    No Known Allergies   Review of Systems:  No headache, visual changes, nausea, vomiting, diarrhea, constipation, dizziness, abdominal pain, skin rash, fevers, chills, night sweats, weight loss, swollen lymph nodes, body aches, joint swelling, chest pain, shortness of breath, mood changes. POSITIVE muscle aches  Objective  Blood pressure 136/82, pulse 68, height 6\' 3"  (1.905 m), weight 214 lb  (97.1 kg), SpO2 95 %.   General: No apparent distress alert and oriented x3 mood and affect normal, dressed appropriately.  HEENT: Pupils equal, extraocular movements intact  Respiratory: Patient's speak in full sentences and does not appear short of breath  Cardiovascular: No lower extremity edema, non tender, no erythema  Neuro: Cranial nerves II through XII are intact, neurovascularly intact in all extremities with 2+ DTRs and 2+ pulses.  Gait normal with good balance and coordination.  MSK:  Non tender with full range of motion and good stability and symmetric strength and tone of shoulders, elbows, wrist, hip, knee and ankles bilaterally.  Neck exam does have some mild loss of lordosis, tender to palpation in the paraspinal musculature lumbar spine right greater than left.  Osteopathic findings  C2 flexed rotated and side bent right C7 flexed rotated and side bent left T8 extended rotated and side bent left L1 flexed rotated and side bent right Sacrum right on right       Assessment and Plan:    Nonallopathic problems  Decision today to treat with OMT was based on Physical Exam  After verbal consent patient was treated with HVLA, ME, FPR techniques in cervical, rib, thoracic, lumbar, and sacral  areas  Patient tolerated the procedure well with improvement in symptoms  Patient given exercises, stretches and lifestyle modifications  See medications in patient instructions if given  Patient will follow up in 4-8 weeks      The above documentation has been reviewed and is accurate and complete Lyndal Pulley, DO  Note: This dictation was prepared with Dragon dictation along with smaller phrase technology. Any transcriptional errors that result from this process are unintentional.

## 2019-11-15 MED FILL — LISINOPRIL 20 MG TABS: 20 | 90 days supply | Qty: 90 | Fill #1

## 2019-12-18 ENCOUNTER — Ambulatory Visit: Payer: Self-pay | Attending: Internal Medicine

## 2019-12-18 ENCOUNTER — Other Ambulatory Visit: Payer: Self-pay

## 2019-12-18 DIAGNOSIS — Z23 Encounter for immunization: Secondary | ICD-10-CM

## 2019-12-18 NOTE — Progress Notes (Signed)
   Covid-19 Vaccination Clinic  Name:  Uvaldo Rybacki    MRN: [id number] DOB: 07-30-1960  12/18/2019  Mr. Winterbottom was observed post Covid-19 immunization for 15 minutes without incident. He was provided with Vaccine Information Sheet and instruction to access the V-Safe system.  Vaccinated by Elisha Headland Fidelis  Mr. Koudelka was instructed to call 911 with any severe reactions post vaccine: Marland Kitchen Difficulty breathing  . Swelling of face and throat  . A fast heartbeat  . A bad rash all over body  . Dizziness and weakness

## 2019-12-26 ENCOUNTER — Other Ambulatory Visit: Payer: Self-pay

## 2019-12-26 ENCOUNTER — Encounter: Payer: Self-pay | Admitting: Family Medicine

## 2019-12-26 ENCOUNTER — Ambulatory Visit: Payer: 59 | Admitting: Family Medicine

## 2019-12-26 VITALS — BP 130/80 | HR 73 | Ht 75.0 in | Wt 212.0 lb

## 2019-12-26 DIAGNOSIS — M999 Biomechanical lesion, unspecified: Secondary | ICD-10-CM

## 2019-12-26 DIAGNOSIS — M5412 Radiculopathy, cervical region: Secondary | ICD-10-CM | POA: Diagnosis not present

## 2019-12-26 NOTE — Assessment & Plan Note (Signed)
Patient did do some driving does have a very mild chronic exacerbation noted today.  Patient was on Effexor in the relatively well.  Patient has been referred for breakthrough.  Multiloculation.  Follow-up again 6 to 8 weeks

## 2019-12-26 NOTE — Patient Instructions (Signed)
Glad you survived the obstacle course See me again in 6-8 weeks

## 2019-12-26 NOTE — Progress Notes (Signed)
[facility] [address] [place] Phone: [phone] Subjective:   I Stephen [name] am serving as a Education administrator for Dr. Hulan Saas.  This visit occurred during the SARS-CoV-2 public health emergency.  Safety protocols were in place, including screening questions prior to the visit, additional usage of staff PPE, and extensive cleaning of exam room while observing appropriate contact time as indicated for disinfecting solutions.   I'm seeing this patient by the request  of:  [name], MD  CC: Neck pain follow-up  [id number]  Stephen [name]. is a 59 y.o. male coming in with complaint of back and neck pain. OMT 11/06/2019. Patient states he is doing well today.  Patient has been relatively active.  Did have a long car drive and thinks that has exacerbated some of it.  Patient describes it as a dull, throbbing aching pain.  No radiation down the arm though.  Doing the exercises occasionally, continuing the same medications.  Medications patient has been prescribed: Effexor, Zanaflex  Taking: Yes         Reviewed prior external information including notes and imaging from previsou exam, outside providers and external EMR if available.   As well as notes that were available from care everywhere and other healthcare systems.  Past medical history, social, surgical and family history all reviewed in electronic medical record.  No pertanent information unless stated regarding to the chief complaint.   Past Medical History:  Diagnosis Date  . Colon polyps   . Detached retina, right 1992   Dr. [name] at [facility] did orbital band procedure with good results.   . High cholesterol   . Hypertension   . Poison ivy dermatitis 07/29/2015    No Known Allergies   Review of Systems:  No headache, visual changes, nausea, vomiting, diarrhea, constipation, dizziness, abdominal pain, skin rash, fevers, chills, night sweats, weight loss,  swollen lymph nodes, body aches, joint swelling, chest pain, shortness of breath, mood changes. POSITIVE muscle aches  Objective  Blood pressure 130/80, pulse 73, height 6\' 3"  (1.905 m), weight 212 lb (96.2 kg), SpO2 98 %.   General: No apparent distress alert and oriented x3 mood and affect normal, dressed appropriately.  HEENT: Pupils equal, extraocular movements intact  Respiratory: Patient's speak in full sentences and does not appear short of breath  Cardiovascular: No lower extremity edema, non tender, no erythema  Neuro: Cranial nerves II through XII are intact, neurovascularly intact in all extremities with 2+ DTRs and 2+ pulses.  Gait normal with good balance and coordination.  MSK:  Non tender with full range of motion and good stability and symmetric strength and tone of shoulders, elbows, wrist, hip, knee and ankles bilaterally.  Back - Normal skin, Spine with normal alignment and no deformity.  No tenderness to vertebral process palpation.  Paraspinous muscles are not tender and without spasm.   Range of motion is full at neck and lumbar sacral regions  Osteopathic findings   C6 flexed rotated and side bent left T8 extended rotated and side bent left L1 flexed rotated and side bent right Sacrum right on right       Assessment and Plan:  Cervical radiculopathy at C8 Patient did do some driving does have a very mild chronic exacerbation noted today.  Patient was on Effexor in the relatively well.  Patient has been referred for breakthrough.  Multiloculation.  Follow-up again 6 to 8 weeks    Nonallopathic problems  Decision today to treat with OMT was based on Physical Exam  After verbal consent patient was treated with HVLA, ME, FPR techniques in cervical, rib, thoracic, lumbar, and sacral  areas  Patient tolerated the procedure well with improvement in symptoms  Patient given exercises, stretches and lifestyle modifications  See medications in patient  instructions if given  Patient will follow up in 4-8 weeks      The above documentation has been reviewed and is accurate and complete Stephen Pulley, DO       Note: This dictation was prepared with Dragon dictation along with smaller phrase technology. Any transcriptional errors that result from this process are unintentional.

## 2020-01-08 ENCOUNTER — Encounter: Payer: Self-pay | Admitting: Family Medicine

## 2020-01-10 ENCOUNTER — Other Ambulatory Visit: Payer: Self-pay | Admitting: Family Medicine

## 2020-01-10 DIAGNOSIS — E782 Mixed hyperlipidemia: Secondary | ICD-10-CM

## 2020-01-10 MED FILL — PRAVASTATIN NA 40 MG TAB: 40 | 90 days supply | Qty: 90 | Fill #0

## 2020-01-24 ENCOUNTER — Other Ambulatory Visit (HOSPITAL_BASED_OUTPATIENT_CLINIC_OR_DEPARTMENT_OTHER): Payer: Self-pay | Admitting: Optometry

## 2020-01-24 DIAGNOSIS — H00022 Hordeolum internum right lower eyelid: Secondary | ICD-10-CM | POA: Diagnosis not present

## 2020-01-24 MED FILL — DOXYCYCLINE HYCLATE 100 MG: 100 | 10 days supply | Qty: 20 | Fill #0

## 2020-01-24 MED FILL — POLYMYXIN B/TMP EYE DROPS: 10000-0.1 | 50 days supply | Qty: 10 | Fill #0

## 2020-01-31 MED FILL — VENLAFAXINE HCL ER 37.5 MG: 37.5 | 90 days supply | Qty: 180 | Fill #1

## 2020-02-11 ENCOUNTER — Other Ambulatory Visit: Payer: Self-pay | Admitting: Family Medicine

## 2020-02-11 ENCOUNTER — Other Ambulatory Visit: Payer: Self-pay | Admitting: Family

## 2020-02-11 DIAGNOSIS — I10 Essential (primary) hypertension: Secondary | ICD-10-CM

## 2020-02-11 MED FILL — LISINOPRIL 20 MG TABS: 20 | 90 days supply | Qty: 90 | Fill #0

## 2020-02-18 NOTE — Progress Notes (Signed)
Stephen [name] Phone: [phone] Subjective:   Stephen [name], am serving as a scribe for Dr. Hulan Saas. This visit occurred during the SARS-CoV-2 public health emergency.  Safety protocols were in place, including screening questions prior to the visit, additional usage of staff PPE, and extensive cleaning of exam room while observing appropriate contact time as indicated for disinfecting solutions.   I'm seeing this patient by the request  of:  [name], MD  CC: Neck pain follow-up  [id number]  Stephen [name]. is a 59 y.o. male coming in with complaint of back and neck pain. OMT 12/26/2019. Patient states doing relatively well overall.  Very mild discomfort.  Been able to increase activity as tolerated.  [name] new symptoms.  Medications patient has been prescribed: Effexor and Zanaflex  Taking: Yes         Reviewed prior external information including notes and imaging from previsou exam, outside providers and external EMR if available.   As well as notes that were available from care everywhere and other healthcare systems.  Past medical history, social, surgical and family history all reviewed in electronic medical record.  [name] pertanent information unless stated regarding to the chief complaint.   Past Medical History:  Diagnosis Date  . Colon polyps   . Detached retina, right 1992   Dr. [name] at [facility] did orbital band procedure with good results.   . High cholesterol   . Hypertension   . Poison ivy dermatitis 07/29/2015    [name] Known Allergies   Review of Systems:  [name] headache, visual changes, nausea, vomiting, diarrhea, constipation, dizziness, abdominal pain, skin rash, fevers, chills, night sweats, weight loss, swollen lymph nodes, body aches, joint swelling, chest pain, shortness of breath, mood changes. POSITIVE muscle aches  Objective  Blood pressure 124/82, pulse 75, height  6\' 3"  (1.905 m), weight 212 lb (96.2 kg), SpO2 98 %.   General: [name] apparent distress alert and oriented x3 mood and affect normal, dressed appropriately.  HEENT: Pupils equal, extraocular movements intact  Respiratory: Patient's speak in full sentences and does not appear short of breath  Cardiovascular: [name] lower extremity edema, non tender, [name] erythema  Neuro: Cranial nerves II through XII are intact, neurovascularly intact in all extremities with 2+ DTRs and 2+ pulses.  Gait normal with good balance and coordination.  MSK:  Non tender with full range of motion and good stability and symmetric strength and tone of shoulders, elbows, wrist, hip, knee and ankles bilaterally.  Neck exam shows some very mild loss of lordosis.  Some tightness noted in the parascapular region right greater than left.  Patient has some very mild tightness noted in the paraspinal musculature of the lumbar spine right greater than left as well.  Negative straight leg test.  Mild tightness of the [place] on the right greater than left.  Osteopathic findings  C6 flexed rotated and side bent right T11 extended rotated and side bent left L1 flexed rotated and side bent right Sacrum right on right       Assessment and Plan:  Cervical radiculopathy at C8 Likely patient has not had any breakthrough at this time.  Discussed posture ergonomics, which activities to do which wants to avoid.  Patient is to increase activity slowly.  This is the best I have seen patient in quite some time.  Does have the Zanaflex for breakthrough and is on a low dose of Zanaflex.  Follow-up with me again 8 weeks    Nonallopathic problems  Decision today to treat with OMT was based on Physical Exam  After verbal consent patient was treated with HVLA, ME, FPR techniques in cervical,  thoracic, lumbar, and sacral  areas  Patient tolerated the procedure well with improvement in symptoms  Patient given exercises, stretches and lifestyle  modifications  See medications in patient instructions if given  Patient will follow up in 4-8 weeks      The above documentation has been reviewed and is accurate and complete Lyndal Pulley, DO       Note: This dictation was prepared with Dragon dictation along with smaller phrase technology. Any transcriptional errors that result from this process are unintentional.

## 2020-02-19 ENCOUNTER — Encounter: Payer: Self-pay | Admitting: Family Medicine

## 2020-02-19 ENCOUNTER — Other Ambulatory Visit: Payer: Self-pay

## 2020-02-19 ENCOUNTER — Ambulatory Visit: Payer: 59 | Admitting: Family Medicine

## 2020-02-19 VITALS — BP 124/82 | HR 75 | Ht 75.0 in | Wt 212.0 lb

## 2020-02-19 DIAGNOSIS — M999 Biomechanical lesion, unspecified: Secondary | ICD-10-CM

## 2020-02-19 DIAGNOSIS — M5412 Radiculopathy, cervical region: Secondary | ICD-10-CM

## 2020-02-19 NOTE — Patient Instructions (Signed)
Happy Holidays Keep it up! Let's space it out to 8 weeks

## 2020-02-19 NOTE — Assessment & Plan Note (Signed)
Likely patient has not had any breakthrough at this time.  Discussed posture ergonomics, which activities to do which wants to avoid.  Patient is to increase activity slowly.  This is the best I have seen patient in quite some time.  Does have the Zanaflex for breakthrough and is on a low dose of Zanaflex.  Follow-up with me again 8 weeks

## 2020-04-14 NOTE — Progress Notes (Signed)
[facility] [address] [place] Phone: [phone] Subjective:   I [name] am serving as a Education administrator for Dr. Hulan Saas.  This visit occurred during the SARS-CoV-2 public health emergency.  Safety protocols were in place, including screening questions prior to the visit, additional usage of staff PPE, and extensive cleaning of exam room while observing appropriate contact time as indicated for disinfecting solutions.   I'm seeing this patient by the request  of:  [name], MD  CC: Neck pain follow-up  [id number]  Stephen [name]. is a 60 y.o. male coming in with complaint of back and neck pain. OMT 02/19/2020. Patient states he has been doing well and is ready for his adjustment.  Overall doing very well patient does have very mild exacerbations.  Seems to be doing well.  Mild tightness here there but nothing that stops him from activity.  No difficulty otherwise.  Medications patient has been prescribed: Effexor  Taking: Yes         Reviewed prior external information including notes and imaging from previsou exam, outside providers and external EMR if available.   As well as notes that were available from care everywhere and other healthcare systems.  Past medical history, social, surgical and family history all reviewed in electronic medical record.  No pertanent information unless stated regarding to the chief complaint.   Past Medical History:  Diagnosis Date  . Colon polyps   . Detached retina, right 1992   Dr. [name] at [facility] did orbital band procedure with good results.   . High cholesterol   . Hypertension   . Poison ivy dermatitis 07/29/2015    No Known Allergies   Review of Systems:  No headache, visual changes, nausea, vomiting, diarrhea, constipation, dizziness, abdominal pain, skin rash, fevers, chills, night sweats, weight loss, swollen lymph nodes, body aches, joint swelling, chest  pain, shortness of breath, mood changes. POSITIVE muscle aches  Objective  Blood pressure 140/70, pulse 70, height 6\' 3"  (1.905 m), weight 213 lb (96.6 kg), SpO2 96 %.   General: No apparent distress alert and oriented x3 mood and affect normal, dressed appropriately.  HEENT: Pupils equal, extraocular movements intact  Respiratory: Patient's speak in full sentences and does not appear short of breath  Cardiovascular: No lower extremity edema, non tender, no erythema  Neuro: Cranial nerves II through XII are intact, neurovascularly intact in all extremities with 2+ DTRs and 2+ pulses.  Gait normal with good balance and coordination.  MSK:  Non tender with full range of motion and good stability and symmetric strength and tone of shoulders, elbows, wrist, hip, knee and ankles bilaterally.  Neck exam does have some mild loss of lordosis.  Some tenderness to palpation in the paraspinal musculature of the cervical spine more at the cervical thoracic area.  Negative Spurling's today.  5-5 strength of the upper extremities.  Mild tightness in the parascapular region.  Osteopathic findings  C6 flexed rotated and side bent left T9 extended rotated and side bent left L4 flexed rotated and side bent right       Assessment and Plan:  Cervical radiculopathy at C8 Patient is doing well at this point.  Stable.  Patient is taking the muscle relaxer as needed and has been doing relatively well with the Effexor.  No significant changes otherwise.  Follow-up with me again 8 to 12 weeks    Nonallopathic problems  Decision today to treat with OMT  was based on Physical Exam  After verbal consent patient was treated with HVLA, ME, FPR techniques in cervical, , thoracic, lumbar, areas  Patient tolerated the procedure well with improvement in symptoms  Patient given exercises, stretches and lifestyle modifications  See medications in patient instructions if given  Patient will follow up in 4-8  weeks      The above documentation has been reviewed and is accurate and complete Lyndal Pulley, DO       Note: This dictation was prepared with Dragon dictation along with smaller phrase technology. Any transcriptional errors that result from this process are unintentional.

## 2020-04-15 ENCOUNTER — Encounter: Payer: Self-pay | Admitting: Family Medicine

## 2020-04-15 ENCOUNTER — Other Ambulatory Visit: Payer: Self-pay | Admitting: Family

## 2020-04-15 ENCOUNTER — Other Ambulatory Visit: Payer: Self-pay | Admitting: Family Medicine

## 2020-04-15 ENCOUNTER — Other Ambulatory Visit: Payer: Self-pay

## 2020-04-15 ENCOUNTER — Ambulatory Visit: Payer: 59 | Admitting: Family Medicine

## 2020-04-15 VITALS — BP 140/70 | HR 70 | Ht 75.0 in | Wt 213.0 lb

## 2020-04-15 DIAGNOSIS — M999 Biomechanical lesion, unspecified: Secondary | ICD-10-CM | POA: Diagnosis not present

## 2020-04-15 DIAGNOSIS — E782 Mixed hyperlipidemia: Secondary | ICD-10-CM

## 2020-04-15 DIAGNOSIS — M5412 Radiculopathy, cervical region: Secondary | ICD-10-CM | POA: Diagnosis not present

## 2020-04-15 MED FILL — PRAVASTATIN NA 40 MG TAB: 40 | 90 days supply | Qty: 90 | Fill #0

## 2020-04-15 NOTE — Assessment & Plan Note (Signed)
Patient is doing well at this point.  Stable.  Patient is taking the muscle relaxer as needed and has been doing relatively well with the Effexor.  No significant changes otherwise.  Follow-up with me again 8 to 12 weeks

## 2020-04-15 NOTE — Telephone Encounter (Signed)
Sent Mychart message to patient asking about scheduling follow up appointment on medications last visit October 2020.

## 2020-04-15 NOTE — Patient Instructions (Signed)
Good to see you Keep it up See me again in 2-3 months

## 2020-04-28 ENCOUNTER — Other Ambulatory Visit: Payer: Self-pay | Admitting: Family Medicine

## 2020-04-28 ENCOUNTER — Encounter: Payer: Self-pay | Admitting: Family Medicine

## 2020-04-28 ENCOUNTER — Other Ambulatory Visit: Payer: Self-pay

## 2020-04-28 MED FILL — VENLAFAXINE HCL ER 37.5 MG: 37.5 | 90 days supply | Qty: 180 | Fill #0

## 2020-04-28 NOTE — Telephone Encounter (Signed)
Left message for patient to call back to confirm that he is still in need of medication.

## 2020-05-15 MED FILL — LISINOPRIL 20 MG TABS: 20 | 90 days supply | Qty: 90 | Fill #1

## 2020-06-23 NOTE — Progress Notes (Signed)
[facility] [address] [place] Phone: [phone] Subjective:   I [name] am serving as a Education administrator for Dr. Hulan Saas.  This visit occurred during the SARS-CoV-2 public health emergency.  Safety protocols were in place, including screening questions prior to the visit, additional usage of staff PPE, and extensive cleaning of exam room while observing appropriate contact time as indicated for disinfecting solutions.   I'm seeing this patient by the request  of:  [name], MD  CC: neck and back pain   [id number]  Glenroy Crossen. is a 60 y.o. male coming in with complaint of back and neck pain. OMT 04/15/2020. Patient states he is doing well.  Patient has not had any significant discomfort.  Continues to take the Effexor fairly regularly.  Overnight no other significant difficulties.  Feeling like he is making good progress.  Staying very active.  Medications patient has been prescribed: Effexor  Taking: Yes         Reviewed prior external information including notes and imaging from previsou exam, outside providers and external EMR if available.   As well as notes that were available from care everywhere and other healthcare systems.  Past medical history, social, surgical and family history all reviewed in electronic medical record.  No pertanent information unless stated regarding to the chief complaint.   Past Medical History:  Diagnosis Date  . Colon polyps   . Detached retina, right 1992   Dr. [name] at [facility] did orbital band procedure with good results.   . High cholesterol   . Hypertension   . Poison ivy dermatitis 07/29/2015    No Known Allergies   Review of Systems:  No headache, visual changes, nausea, vomiting, diarrhea, constipation, dizziness, abdominal pain, skin rash, fevers, chills, night sweats, weight loss, swollen lymph nodes, body aches, joint swelling, chest pain, shortness of  breath, mood changes. POSITIVE muscle aches  Objective  Blood pressure (!) 142/82, pulse 71, height 6\' 3"  (1.905 m), weight 217 lb (98.4 kg), SpO2 98 %.   General: No apparent distress alert and oriented x3 mood and affect normal, dressed appropriately.  HEENT: Pupils equal, extraocular movements intact  Respiratory: Patient's speak in full sentences and does not appear short of breath  Cardiovascular: No lower extremity edema, non tender, no erythema  Gait normal with good balance and coordination.  MSK:  Non tender with full range of motion and good stability and symmetric strength and tone of shoulders, elbows, wrist, hip, knee and ankles bilaterally.  Neck exam did have significant loss of lordosis.  Patient still has good movement.  Negative Spurling's.  5-5 strength of the upper extremities. Back -mild tightness in the sacroiliac joint right greater than left.  Osteopathic findings  C6 flexed rotated and side bent left T7 extended rotated and side bent left L2 flexed rotated and side bent right       Assessment and Plan:  Cervical radiculopathy at C8 No significant radicular symptoms.  Doing very well at this point.  With no significant tightness noted.  Patient has been doing very well at this point and will be spaced to 10 to 12-week intervals.    Nonallopathic problems  Decision today to treat with OMT was based on Physical Exam  After verbal consent patient was treated with HVLA, ME, FPR techniques in cervical, thoracic, lumbar areas  Patient tolerated the procedure well with improvement in symptoms  Patient given exercises, stretches and lifestyle  modifications  See medications in patient instructions if given  Patient will follow up in 4-8 weeks      The above documentation has been reviewed and is accurate and complete Lyndal Pulley, DO       Note: This dictation was prepared with Dragon dictation along with smaller phrase technology. Any  transcriptional errors that result from this process are unintentional.

## 2020-06-24 ENCOUNTER — Ambulatory Visit: Payer: 59 | Admitting: Family Medicine

## 2020-06-24 ENCOUNTER — Other Ambulatory Visit: Payer: Self-pay

## 2020-06-24 ENCOUNTER — Encounter: Payer: Self-pay | Admitting: Family Medicine

## 2020-06-24 VITALS — BP 142/82 | HR 71 | Ht 75.0 in | Wt 217.0 lb

## 2020-06-24 DIAGNOSIS — M5412 Radiculopathy, cervical region: Secondary | ICD-10-CM

## 2020-06-24 DIAGNOSIS — M999 Biomechanical lesion, unspecified: Secondary | ICD-10-CM | POA: Diagnosis not present

## 2020-06-24 NOTE — Assessment & Plan Note (Signed)
No significant radicular symptoms.  Doing very well at this point.  With no significant tightness noted.  Patient has been doing very well at this point and will be spaced to 10 to 12-week intervals.

## 2020-06-24 NOTE — Patient Instructions (Addendum)
Good to see you Good luck with the race I want to see the trophy See me again in 10-12 weeks

## 2020-07-14 ENCOUNTER — Other Ambulatory Visit: Payer: Self-pay | Admitting: Family

## 2020-07-14 DIAGNOSIS — E782 Mixed hyperlipidemia: Secondary | ICD-10-CM

## 2020-07-15 ENCOUNTER — Other Ambulatory Visit (HOSPITAL_BASED_OUTPATIENT_CLINIC_OR_DEPARTMENT_OTHER): Payer: Self-pay

## 2020-07-16 ENCOUNTER — Other Ambulatory Visit (HOSPITAL_BASED_OUTPATIENT_CLINIC_OR_DEPARTMENT_OTHER): Payer: Self-pay

## 2020-07-16 ENCOUNTER — Other Ambulatory Visit: Payer: Self-pay

## 2020-07-16 DIAGNOSIS — E782 Mixed hyperlipidemia: Secondary | ICD-10-CM

## 2020-07-16 MED ORDER — PRAVASTATIN SODIUM 40 MG PO TABS
ORAL_TABLET | Freq: Every day | ORAL | 0 refills | Status: DC
  Filled 2020-07-16: qty 30, 30d supply, fill #0

## 2020-07-16 NOTE — Telephone Encounter (Signed)
Patient called in requesting a refill until he is able to have his scheduled appointment with Stephen [name] on 08/06/20. Patient has not been seen since December 26, 2018. He has one more pill left for tonight. He uses the [facility] in [place]. Can we refill for 30 days since his appointment is scheduled for 08/06/20?

## 2020-07-17 ENCOUNTER — Other Ambulatory Visit (HOSPITAL_BASED_OUTPATIENT_CLINIC_OR_DEPARTMENT_OTHER): Payer: Self-pay

## 2020-07-28 ENCOUNTER — Other Ambulatory Visit (HOSPITAL_BASED_OUTPATIENT_CLINIC_OR_DEPARTMENT_OTHER): Payer: Self-pay

## 2020-07-28 MED FILL — Venlafaxine HCl Cap ER 24HR 37.5 MG (Base Equivalent): ORAL | 90 days supply | Qty: 180 | Fill #0 | Status: AC

## 2020-08-06 ENCOUNTER — Ambulatory Visit (INDEPENDENT_AMBULATORY_CARE_PROVIDER_SITE_OTHER): Payer: 59 | Admitting: Family Medicine

## 2020-08-06 ENCOUNTER — Other Ambulatory Visit (HOSPITAL_BASED_OUTPATIENT_CLINIC_OR_DEPARTMENT_OTHER): Payer: Self-pay

## 2020-08-06 ENCOUNTER — Other Ambulatory Visit: Payer: Self-pay

## 2020-08-06 ENCOUNTER — Encounter: Payer: Self-pay | Admitting: Family Medicine

## 2020-08-06 VITALS — BP 132/74 | HR 67 | Temp 97.3°F | Ht 75.0 in | Wt 214.4 lb

## 2020-08-06 DIAGNOSIS — Z0000 Encounter for general adult medical examination without abnormal findings: Secondary | ICD-10-CM | POA: Diagnosis not present

## 2020-08-06 DIAGNOSIS — E782 Mixed hyperlipidemia: Secondary | ICD-10-CM | POA: Diagnosis not present

## 2020-08-06 DIAGNOSIS — R195 Other fecal abnormalities: Secondary | ICD-10-CM | POA: Insufficient documentation

## 2020-08-06 DIAGNOSIS — I10 Essential (primary) hypertension: Secondary | ICD-10-CM

## 2020-08-06 DIAGNOSIS — H00011 Hordeolum externum right upper eyelid: Secondary | ICD-10-CM

## 2020-08-06 LAB — URINALYSIS, ROUTINE W REFLEX MICROSCOPIC
Bilirubin Urine: NEGATIVE
Hgb urine dipstick: NEGATIVE
Ketones, ur: NEGATIVE
Leukocytes,Ua: NEGATIVE
Nitrite: NEGATIVE
RBC / HPF: NONE SEEN (ref 0–?)
Specific Gravity, Urine: 1.02 (ref 1.000–1.030)
Total Protein, Urine: NEGATIVE
Urine Glucose: NEGATIVE
Urobilinogen, UA: 0.2 (ref 0.0–1.0)
WBC, UA: NONE SEEN (ref 0–?)
pH: 6.5 (ref 5.0–8.0)

## 2020-08-06 LAB — CBC
HCT: 40.6 % (ref 39.0–52.0)
Hemoglobin: 13.7 g/dL (ref 13.0–17.0)
MCHC: 33.8 g/dL (ref 30.0–36.0)
MCV: 91.5 fl (ref 78.0–100.0)
Platelets: 175 10*3/uL (ref 150.0–400.0)
RBC: 4.44 Mil/uL (ref 4.22–5.81)
RDW: 12.9 % (ref 11.5–15.5)
WBC: 5 10*3/uL (ref 4.0–10.5)

## 2020-08-06 LAB — COMPREHENSIVE METABOLIC PANEL
ALT: 37 U/L (ref 0–53)
AST: 26 U/L (ref 0–37)
Albumin: 4.3 g/dL (ref 3.5–5.2)
Alkaline Phosphatase: 64 U/L (ref 39–117)
BUN: 17 mg/dL (ref 6–23)
CO2: 29 mEq/L (ref 19–32)
Calcium: 8.9 mg/dL (ref 8.4–10.5)
Chloride: 108 mEq/L (ref 96–112)
Creatinine, Ser: 1.06 mg/dL (ref 0.40–1.50)
GFR: 76.59 mL/min (ref >60.00)
Glucose, Bld: 100 mg/dL — ABNORMAL HIGH (ref 70–99)
Potassium: 4.3 mEq/L (ref 3.5–5.1)
Sodium: 145 mEq/L (ref 135–145)
Total Bilirubin: 0.7 mg/dL (ref 0.2–1.2)
Total Protein: 6.4 g/dL (ref 6.0–8.3)

## 2020-08-06 LAB — LIPID PANEL
Cholesterol: 162 mg/dL (ref 0–200)
HDL: 49.5 mg/dL (ref >39.00)
LDL Cholesterol: 96 mg/dL (ref 0–99)
NonHDL: 112.73
Total CHOL/HDL Ratio: 3
Triglycerides: 82 mg/dL (ref 0.0–149.0)
VLDL: 16.4 mg/dL (ref 0.0–40.0)

## 2020-08-06 LAB — PSA: PSA: 0.69 ng/mL (ref 0.10–4.00)

## 2020-08-06 MED ORDER — LISINOPRIL 20 MG PO TABS
ORAL_TABLET | Freq: Every day | ORAL | 4 refills | Status: DC
Start: 2020-08-06 — End: 2021-08-10
  Filled 2020-08-06: qty 90, 90d supply, fill #0
  Filled 2020-11-12: qty 90, 90d supply, fill #1
  Filled 2021-02-06: qty 90, 90d supply, fill #2
  Filled 2021-05-14: qty 90, 90d supply, fill #3

## 2020-08-06 MED ORDER — PRAVASTATIN SODIUM 40 MG PO TABS
ORAL_TABLET | Freq: Every day | ORAL | 4 refills | Status: DC
  Filled 2020-08-06 – 2020-08-11 (×2): qty 90, 90d supply, fill #0
  Filled 2020-11-12: qty 90, 90d supply, fill #1
  Filled 2021-02-06: qty 90, 90d supply, fill #2
  Filled 2021-05-14: qty 90, 90d supply, fill #3

## 2020-08-06 NOTE — Progress Notes (Signed)
Established Patient Office Visit  Subjective:  Patient ID: Stephen [name]., male    DOB: [birth year]  Age: 60 y.o. MRN: [id number]  CC:  Chief Complaint  Patient presents with  . Annual Exam    CPE, stye on right eye patient would like checked. Patient fasting for labs.     HPI Brevyn Ring. presents for yearly physical and follow-up of hypertension and elevated cholesterol.  Blood pressure has been well controlled with the lisinopril.  Tolerating pravastatin.  He is taking it nightly.  Tender swelling on his right upper eyelid.  He has been treating it with warm compresses.  Just received a letter from his gastroenterologist letting him know that it is time for repeat colonoscopy.  He is due for follow-up with dermatologist.  Exercising regularly.  Urine flow has been good with occasional nocturia.  Past Medical History:  Diagnosis Date  . Colon polyps   . Detached retina, right 1992   Dr. [name] at [facility] did orbital band procedure with good results.   . High cholesterol   . Hypertension   . Poison ivy dermatitis 07/29/2015    Past Surgical History:  Procedure Laterality Date  . RETINAL DETACHMENT SURGERY      Family History  Problem Relation Age of Onset  . Parkinsonism Mother   . Cancer Father        colon cancer with mets  . Hyperlipidemia Father   . Hypertension Father   . Hypertension Brother   . Diabetes Neg Hx   . Heart disease Neg Hx     Social History   Socioeconomic History  . Marital status: Married    Spouse name: Not on file  . Number of children: 2  . Years of education: 96  . Highest education level: Not on file  Occupational History  . Occupation: busines    Employer: [employer]  Tobacco Use  . Smoking status: Never Smoker  . Smokeless tobacco: Never Used  Vaping Use  . Vaping Use: Never used  Substance and Sexual Activity  . Alcohol use: Yes    Alcohol/week: 1.0 standard drink    Types: 1 Standard drinks or equivalent per week  .  Drug use: No  . Sexual activity: Yes    Partners: Female  Other Topics Concern  . Not on file  Social History Narrative   HSG, [employer] - BS business admin. Married '84 - 1 son '86, 1 dtr -'70. 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 ACP discussed and he is referred to [email or web address].   Social Determinants of Health   Financial Resource Strain: Not on file  Food Insecurity: Not on file  Transportation Needs: Not on file  Physical Activity: Not on file  Stress: Not on file  Social Connections: Not on file  Intimate Partner Violence: Not on file    Outpatient Medications Prior to Visit  Medication Sig Dispense Refill  . fish oil-omega-3 fatty acids 1000 MG capsule Take 2 g by mouth daily.    . Multiple Vitamin (MULTIVITAMIN) tablet Take 1 tablet by mouth daily.    Marland Kitchen tiZANidine (ZANAFLEX) 4 MG capsule 1 tablet at night 30 capsule 0  . lisinopril (ZESTRIL) 20 MG tablet TAKE 1 TABLET BY MOUTH ONCE DAILY 90 tablet 1  . pravastatin (PRAVACHOL) 40 MG tablet TAKE 1 TABLET BY MOUTH EVERY NIGHT AT BEDTIME 30 tablet 0  . doxycycline (VIBRA-TABS) 100 MG tablet TAKE 1  TABLET BY MOUTH TWICE DAILY 20 tablet 0  . trimethoprim-polymyxin b (POLYTRIM) ophthalmic solution PLACE 1 DROP INTO THE RIGHT EYE FOUR TIMES DAILY 10 mL 0  . venlafaxine (EFFEXOR) 37.5 MG tablet Take 37.5 mg by mouth 2 (two) times daily.    Marland Kitchen venlafaxine XR (EFFEXOR-XR) 37.5 MG 24 hr capsule TAKE 2 CAPSULES BY MOUTH ONCE DAILY WITH BREAKFAST 180 capsule 1   No facility-administered medications prior to visit.    No Known Allergies  ROS Review of Systems  Constitutional: Negative.   HENT: Negative.   Eyes: Negative for photophobia and visual disturbance.  Respiratory: Negative.   Cardiovascular: Negative.   Gastrointestinal: Negative.   Endocrine: Negative for polyphagia and polyuria.  Genitourinary: Negative for difficulty urinating,  frequency and urgency.  Musculoskeletal: Negative.   Skin: Negative for color change and pallor.  Neurological: Negative for speech difficulty and weakness.  Psychiatric/Behavioral: Negative.       Objective:    Physical Exam Vitals and nursing note reviewed.  Constitutional:      General: He is not in acute distress.    Appearance: Normal appearance. He is not ill-appearing or toxic-appearing.  HENT:     Head: Normocephalic and atraumatic.     Right Ear: Tympanic membrane, ear canal and external ear normal.     Left Ear: Tympanic membrane, ear canal and external ear normal.     Mouth/Throat:     Mouth: Mucous membranes are moist.     Pharynx: Oropharynx is clear. No oropharyngeal exudate or posterior oropharyngeal erythema.  Eyes:     General: No scleral icterus.       Right eye: No discharge.        Left eye: No discharge.     Extraocular Movements: Extraocular movements intact.     Conjunctiva/sclera: Conjunctivae normal.     Pupils: Pupils are equal, round, and reactive to light.  Neck:     Vascular: No carotid bruit.  Cardiovascular:     Rate and Rhythm: Normal rate and regular rhythm.  Pulmonary:     Effort: Pulmonary effort is normal.     Breath sounds: Normal breath sounds.  Abdominal:     General: Abdomen is flat. Bowel sounds are normal. There is no distension.     Palpations: There is no mass.     Tenderness: There is no abdominal tenderness. There is no guarding or rebound.     Hernia: No hernia is present.  Genitourinary:    Prostate: Enlarged. Not tender and no nodules present.     Rectum: Guaiac result positive. No mass, tenderness, anal fissure, external hemorrhoid or internal hemorrhoid. Normal anal tone.  Musculoskeletal:     Cervical back: No rigidity or tenderness.  Lymphadenopathy:     Cervical: No cervical adenopathy.  Skin:    General: Skin is warm and dry.       Neurological:     Mental Status: He is alert and oriented to person, place, and  time.  Psychiatric:        Mood and Affect: Mood normal.        Behavior: Behavior normal.     BP 132/74   Pulse 67   Temp (!) 97.3 F (36.3 C) (Temporal)   Ht 6\' 3"  (1.905 m)   Wt 214 lb 6.4 oz (97.3 kg)   SpO2 99%   BMI 26.80 kg/m  Wt Readings from Last 3 Encounters:  08/06/20 214 lb 6.4 oz (97.3 kg)  06/24/20 217 lb (98.4  kg)  04/15/20 213 lb (96.6 kg)     There are no preventive care reminders to display for this patient.  There are no preventive care reminders to display for this patient.  Lab Results  Component Value Date   TSH 0.69 02/15/2017   Lab Results  Component Value Date   WBC 5.1 09/21/2018   HGB 14.2 09/21/2018   HCT 42.3 09/21/2018   MCV 91.8 09/21/2018   PLT 175.0 09/21/2018   Lab Results  Component Value Date   NA 143 09/21/2018   K 4.0 09/21/2018   CO2 30 09/21/2018   GLUCOSE 95 09/21/2018   BUN 18 09/21/2018   CREATININE 1.07 09/21/2018   BILITOT 0.7 09/21/2018   ALKPHOS 56 09/21/2018   AST 24 09/21/2018   ALT 33 09/21/2018   PROT 6.7 09/21/2018   ALBUMIN 4.6 09/21/2018   CALCIUM 9.0 09/21/2018   GFR 70.97 09/21/2018   Lab Results  Component Value Date   CHOL 143 09/21/2018   Lab Results  Component Value Date   HDL 49.40 09/21/2018   Lab Results  Component Value Date   LDLCALC 76 09/21/2018   Lab Results  Component Value Date   TRIG 87.0 09/21/2018   Lab Results  Component Value Date   CHOLHDL 3 09/21/2018   No results found for: HGBA1C    Assessment & Plan:   Problem List Items Addressed This Visit      Cardiovascular and Mediastinum   Essential hypertension   Relevant Medications   lisinopril (ZESTRIL) 20 MG tablet   pravastatin (PRAVACHOL) 40 MG tablet   Other Relevant Orders   CBC   Comprehensive metabolic panel   Urinalysis, Routine w reflex microscopic     Other   Mixed hyperlipidemia - Primary   Relevant Medications   lisinopril (ZESTRIL) 20 MG tablet   pravastatin (PRAVACHOL) 40 MG tablet    Other Relevant Orders   Comprehensive metabolic panel   Lipid panel   Healthcare maintenance   Relevant Orders   PSA   Heme positive stool   Hordeolum externum of right upper eyelid      Meds ordered this encounter  Medications  . lisinopril (ZESTRIL) 20 MG tablet    Sig: TAKE 1 TABLET BY MOUTH ONCE DAILY    Dispense:  90 tablet    Refill:  4  . pravastatin (PRAVACHOL) 40 MG tablet    Sig: TAKE 1 TABLET BY MOUTH EVERY NIGHT AT BEDTIME    Dispense:  90 tablet    Refill:  4    Follow-up: Return in about 1 year (around 08/06/2021), or if symptoms worsen or fail to improve.  Given information on health maintenance and disease prevention.  Also given information on hordeolum's.  He will continue to apply warm compresses and let me know if it is not doing better by the end of the week.  Did find a heme positive stool on exam today.  He will go ahead and schedule an appointment with his GI doctor for his next colonoscopy.  He will schedule to see his dermatologist for follow-up.  Continue lisinopril and pravastatin for hypertension and cholesterol.  Continue exercising.  [name], MD

## 2020-08-06 NOTE — Patient Instructions (Signed)
Health Maintenance, Male Adopting a healthy lifestyle and getting preventive care are important in promoting health and wellness. Ask your health care provider about:  The right schedule for you to have regular tests and exams.  Things you can do on your own to prevent diseases and keep yourself healthy. What should I know about diet, weight, and exercise? Eat a healthy diet  Eat a diet that includes plenty of vegetables, fruits, low-fat dairy products, and lean protein.  Do not eat a lot of foods that are high in solid fats, added sugars, or sodium.   Maintain a healthy weight Body mass index (BMI) is a measurement that can be used to identify possible weight problems. It estimates body fat based on height and weight. Your health care provider can help determine your BMI and help you achieve or maintain a healthy weight. Get regular exercise Get regular exercise. This is one of the most important things you can do for your health. Most adults should:  Exercise for at least 150 minutes each week. The exercise should increase your heart rate and make you sweat (moderate-intensity exercise).  Do strengthening exercises at least twice a week. This is in addition to the moderate-intensity exercise.  Spend less time sitting. Even light physical activity can be beneficial. Watch cholesterol and blood lipids Have your blood tested for lipids and cholesterol at 60 years of age, then have this test every 5 years. You may need to have your cholesterol levels checked more often if:  Your lipid or cholesterol levels are high.  You are older than 60 years of age.  You are at high risk for heart disease. What should I know about cancer screening? Many types of cancers can be detected early and may often be prevented. Depending on your health history and family history, you may need to have cancer screening at various ages. This may include screening for:  Colorectal cancer.  Prostate  cancer.  Skin cancer.  Lung cancer. What should I know about heart disease, diabetes, and high blood pressure? Blood pressure and heart disease  High blood pressure causes heart disease and increases the risk of stroke. This is more likely to develop in people who have high blood pressure readings, are of African descent, or are overweight.  Talk with your health care provider about your target blood pressure readings.  Have your blood pressure checked: ? Every 3-5 years if you are 18-39 years of age. ? Every year if you are 40 years old or older.  If you are between the ages of 65 and 75 and are a current or former smoker, ask your health care provider if you should have a one-time screening for abdominal aortic aneurysm (AAA). Diabetes Have regular diabetes screenings. This checks your fasting blood sugar level. Have the screening done:  Once every three years after age 45 if you are at a normal weight and have a low risk for diabetes.  More often and at a younger age if you are overweight or have a high risk for diabetes. What should I know about preventing infection? Hepatitis B If you have a higher risk for hepatitis B, you should be screened for this virus. Talk with your health care provider to find out if you are at risk for hepatitis B infection. Hepatitis C Blood testing is recommended for:  Everyone born from 1945 through 1965.  Anyone with known risk factors for hepatitis C. Sexually transmitted infections (STIs)  You should be screened each   year for STIs, including gonorrhea and chlamydia, if: ? You are sexually active and are younger than 61 years of age. ? You are older than 60 years of age and your health care provider tells you that you are at risk for this type of infection. ? Your sexual activity has changed since you were last screened, and you are at increased risk for chlamydia or gonorrhea. Ask your health care provider if you are at risk.  Ask your  health care provider about whether you are at high risk for HIV. Your health care provider may recommend a prescription medicine to help prevent HIV infection. If you choose to take medicine to prevent HIV, you should first get tested for HIV. You should then be tested every 3 months for as long as you are taking the medicine. Follow these instructions at home: Lifestyle  Do not use any products that contain nicotine or tobacco, such as cigarettes, e-cigarettes, and chewing tobacco. If you need help quitting, ask your health care provider.  Do not use street drugs.  Do not share needles.  Ask your health care provider for help if you need support or information about quitting drugs. Alcohol use  Do not drink alcohol if your health care provider tells you not to drink.  If you drink alcohol: ? Limit how much you have to 0-2 drinks a day. ? Be aware of how much alcohol is in your drink. In the [place] one drink equals one 12 oz bottle of beer (355 mL), one 5 oz glass of wine (148 mL), or one 1 oz glass of hard liquor (44 mL). General instructions  Schedule regular health, dental, and eye exams.  Stay current with your vaccines.  Tell your health care provider if: ? You often feel depressed. ? You have ever been abused or do not feel safe at home. Summary  Adopting a healthy lifestyle and getting preventive care are important in promoting health and wellness.  Follow your health care provider's instructions about healthy diet, exercising, and getting tested or screened for diseases.  Follow your health care provider's instructions on monitoring your cholesterol and blood pressure. This information is not intended to replace advice given to you by your health care provider. Make sure you discuss any questions you have with your health care provider. Document Revised: 03/01/2018 Document Reviewed: 03/01/2018 Elsevier Patient Education  2021 [place] Years  Old, Male Preventive care refers to lifestyle choices and visits with your health care provider that can promote health and wellness. This includes:  A yearly physical exam. This is also called an annual wellness visit.  Regular dental and eye exams.  Immunizations.  Screening for certain conditions.  Healthy lifestyle choices, such as: ? Eating a healthy diet. ? Getting regular exercise. ? Not using drugs or products that contain nicotine and tobacco. ? Limiting alcohol use. What can I expect for my preventive care visit? Physical exam Your health care provider will check your:  Height and weight. These may be used to calculate your BMI (body mass index). BMI is a measurement that tells if you are at a healthy weight.  Heart rate and blood pressure.  Body temperature.  Skin for abnormal spots. Counseling Your health care provider may ask you questions about your:  Past medical problems.  Family's medical history.  Alcohol, tobacco, and drug use.  Emotional well-being.  Home life and relationship well-being.  Sexual activity.  Diet, exercise, and sleep habits.  Work and work Statistician.  Access to firearms. What immunizations do I need? Vaccines are usually given at various ages, according to a schedule. Your health care provider will recommend vaccines for you based on your age, medical history, and lifestyle or other factors, such as travel or where you work.   What tests do I need? Blood tests  Lipid and cholesterol levels. These may be checked every 5 years, or more often if you are over 44 years old.  Hepatitis C test.  Hepatitis B test. Screening  Lung cancer screening. You may have this screening every year starting at age 77 if you have a 30-pack-year history of smoking and currently smoke or have quit within the past 15 years.  Prostate cancer screening. Recommendations will vary depending on your family history and other risks.  Genital exam  to check for testicular cancer or hernias.  Colorectal cancer screening. ? All adults should have this screening starting at age 36 and continuing until age 28. ? Your health care provider may recommend screening at age 16 if you are at increased risk. ? You will have tests every 1-10 years, depending on your results and the type of screening test.  Diabetes screening. ? This is done by checking your blood sugar (glucose) after you have not eaten for a while (fasting). ? You may have this done every 1-3 years.  STD (sexually transmitted disease) testing, if you are at risk. Follow these instructions at home: Eating and drinking  Eat a diet that includes fresh fruits and vegetables, whole grains, lean protein, and low-fat dairy products.  Take vitamin and mineral supplements as recommended by your health care provider.  Do not drink alcohol if your health care provider tells you not to drink.  If you drink alcohol: ? Limit how much you have to 0-2 drinks a day. ? Be aware of how much alcohol is in your drink. In the [place] one drink equals one 12 oz bottle of beer (355 mL), one 5 oz glass of wine (148 mL), or one 1 oz glass of hard liquor (44 mL).   Lifestyle  Take daily care of your teeth and gums. Brush your teeth every morning and night with fluoride toothpaste. Floss one time each day.  Stay active. Exercise for at least 30 minutes 5 or more days each week.  Do not use any products that contain nicotine or tobacco, such as cigarettes, e-cigarettes, and chewing tobacco. If you need help quitting, ask your health care provider.  Do not use drugs.  If you are sexually active, practice safe sex. Use a condom or other form of protection to prevent STIs (sexually transmitted infections).  If told by your health care provider, take low-dose aspirin daily starting at age 10.  Find healthy ways to cope with stress, such as: ? Meditation, yoga, or listening to  music. ? Journaling. ? Talking to a trusted person. ? Spending time with friends and family. Safety  Always wear your seat belt while driving or riding in a vehicle.  Do not drive: ? If you have been drinking alcohol. Do not ride with someone who has been drinking. ? When you are tired or distracted. ? While texting.  Wear a helmet and other protective equipment during sports activities.  If you have firearms in your house, make sure you follow all gun safety procedures. What's next?  Go to your health care provider once a year for an annual wellness visit.  Ask your health  care provider how often you should have your eyes and teeth checked.  Stay up to date on all vaccines. This information is not intended to replace advice given to you by your health care provider. Make sure you discuss any questions you have with your health care provider. Document Revised: 12/05/2018 Document Reviewed: 03/02/2018 Elsevier Patient Education  2021 [place], also known as a hordeolum, is a bump that forms on an eyelid. It may look like a pimple next to the eyelash. A stye can form inside the eyelid (internal stye) or outside the eyelid (external stye). A stye can cause redness, swelling, and pain on the eyelid. Styes are very common. Anyone can get them at any age. They usually occur in just one eye, but you may have more than one in either eye. What are the causes? A stye is caused by an infection. The infection is almost always caused by bacteria called Staphylococcus aureus. This is a common type of bacteria that lives on the skin. An internal stye may result from an infected oil-producing gland inside the eyelid. An external stye may be caused by an infection at the base of the eyelash (hair follicle). What increases the risk? You are more likely to develop a stye if:  You have had a stye before.  You have any of these conditions: ? Diabetes. ? Red, itchy, inflamed  eyelids (blepharitis). ? A skin condition such as seborrheic dermatitis or rosacea. ? High fat levels in your blood (lipids). What are the signs or symptoms? The most common symptom of a stye is eyelid pain. Internal styes are more painful than external styes. Other symptoms may include:  Painful swelling of your eyelid.  A scratchy feeling in your eye.  Tearing and redness of your eye.  Pus draining from the stye.   How is this diagnosed? Your health care provider may be able to diagnose a stye just by examining your eye. The health care provider may also check to make sure:  You do not have a fever or other signs of a more serious infection.  The infection has not spread to other parts of your eye or areas around your eye. How is this treated? Most styes will clear up in a few days without treatment or with warm compresses applied to the area. You may need to use antibiotic drops or ointment to treat an infection. In some cases, if your stye does not heal with routine treatment, your health care provider may drain pus from the stye using a thin blade or needle. This may be done if the stye is large, causing a lot of pain, or affecting your vision. Follow these instructions at home:  Take over-the-counter and prescription medicines only as told by your health care provider. This includes eye drops or ointments.  If you were prescribed an antibiotic medicine, apply or use it as told by your health care provider. Do not stop using the antibiotic even if your condition improves.  Apply a warm, wet cloth (warm compress) to your eye for 5-10 minutes, 4 times a day.  Clean the affected eyelid as directed by your health care provider.  Do not wear contact lenses or eye makeup until your stye has healed.  Do not try to pop or drain the stye.  Do not rub your eye. Contact a health care provider if:  You have chills or a fever.  Your stye does not go away after several days.  Your  stye affects your vision.  Your eyeball becomes swollen, red, or painful. Get help right away if:  You have pain when moving your eye around. Summary  A stye is a bump that forms on an eyelid. It may look like a pimple next to the eyelash.  A stye can form inside the eyelid (internal stye) or outside the eyelid (external stye). A stye can cause redness, swelling, and pain on the eyelid.  Your health care provider may be able to diagnose a stye just by examining your eye.  Apply a warm, wet cloth (warm compress) to your eye for 5-10 minutes, 4 times a day. This information is not intended to replace advice given to you by your health care provider. Make sure you discuss any questions you have with your health care provider. Document Revised: 11/15/2019 Document Reviewed: 11/15/2019 Elsevier Patient Education  [place].

## 2020-08-11 ENCOUNTER — Other Ambulatory Visit (HOSPITAL_BASED_OUTPATIENT_CLINIC_OR_DEPARTMENT_OTHER): Payer: Self-pay

## 2020-09-01 NOTE — Progress Notes (Signed)
[place] Phone: [phone] Subjective:   Stephen Stephen [name], am serving as a scribe for Dr. Hulan Saas.  This visit occurred during the SARS-CoV-2 public health emergency.  Safety protocols were in place, including screening questions prior to the visit, additional usage of staff PPE, and extensive cleaning of exam room while observing appropriate contact time as indicated for disinfecting solutions.    I'm seeing this patient by the request  of:  [name], MD  CC: neck and back pain   [id number]  Stephen Stephen [name]. is a 60 y.o. male coming in with complaint of back and neck pain. OMT 06/24/2020. Patient states that he has been doing well.   Medications patient has been prescribed: None  Taking:         Reviewed prior external information including notes and imaging from previsou exam, outside providers and external EMR if available.   As well as notes that were available from care everywhere and other healthcare systems.  Past medical history, social, surgical and family history all reviewed in electronic medical record.  Stephen [name] pertanent information unless stated regarding to the chief complaint.   Past Medical History:  Diagnosis Date   Colon polyps    Detached retina, right 28   Dr. [name] at [facility] did orbital band procedure with good results.    High cholesterol    Hypertension    Poison ivy dermatitis 07/29/2015    Stephen [name] Known Allergies   Review of Systems:  Stephen [name] headache, visual changes, nausea, vomiting, diarrhea, constipation, dizziness, abdominal pain, skin rash, fevers, chills, night sweats, weight loss, swollen lymph nodes, body aches, joint swelling, chest pain, shortness of breath, mood changes. POSITIVE muscle aches  Objective  Blood pressure 124/72, pulse 67, height 6\' 3"  (1.905 m), weight 214 lb (97.1 kg), SpO2 97 %.   General: Stephen [name] apparent distress alert and oriented x3  mood and affect normal, dressed appropriately.  HEENT: Pupils equal, extraocular movements intact  Respiratory: Patient's speak in full sentences and does not appear short of breath  Cardiovascular: Stephen [name] lower extremity edema, non tender, Stephen [name] erythema  Neck exam does show significant tightness noted.  Tightness noted on both paraspinal musculature of the cervical spine.  Patient also has tightness in the rhomboids bilaterally.  Right-sided parascapular tightness noted.  Mild pain in the lower back as well to palpation.  Near full range of motion though of the lower back.  Osteopathic findings  C2 flexed rotated and side bent right C6 flexed rotated and side bent left T3 extended rotated and side bent right inhaled rib T9 extended rotated and side bent left L2 flexed rotated and side bent right Sacrum right on right       Assessment and Plan:  Cervical radiculopathy at C8 Patient likely is not having any radicular symptoms but does seem to have a chronic problem with exacerbation.  Patient did do a lot of overhead likely contributed.  Patient has been prescribed Zanaflex previously.  Responded well to osteopathic manipulation.  Discussed which activities to do which wants to avoid.  Increase activity slowly.  Follow-up with me again in 6 to 8 weeks patient knows to call if any radicular symptoms do occur.   Nonallopathic problems  Decision today to treat with OMT was based on Physical Exam  After verbal consent patient was treated with HVLA, ME, FPR techniques in cervical, rib, thoracic, lumbar, and sacral  areas  Patient tolerated  the procedure well with improvement in symptoms  Patient given exercises, stretches and lifestyle modifications  See medications in patient instructions if given  Patient will follow up in 4-8 weeks      The above documentation has been reviewed and is accurate and complete Lyndal Pulley, DO        Note: This dictation was prepared with Dragon  dictation along with smaller phrase technology. Any transcriptional errors that result from this process are unintentional.

## 2020-09-02 ENCOUNTER — Encounter: Payer: Self-pay | Admitting: Family Medicine

## 2020-09-02 ENCOUNTER — Ambulatory Visit: Payer: 59 | Admitting: Family Medicine

## 2020-09-02 ENCOUNTER — Other Ambulatory Visit: Payer: Self-pay

## 2020-09-02 VITALS — BP 124/72 | HR 67 | Ht 75.0 in | Wt 214.0 lb

## 2020-09-02 DIAGNOSIS — M9901 Segmental and somatic dysfunction of cervical region: Secondary | ICD-10-CM

## 2020-09-02 DIAGNOSIS — M9908 Segmental and somatic dysfunction of rib cage: Secondary | ICD-10-CM

## 2020-09-02 DIAGNOSIS — M9902 Segmental and somatic dysfunction of thoracic region: Secondary | ICD-10-CM | POA: Diagnosis not present

## 2020-09-02 DIAGNOSIS — M9904 Segmental and somatic dysfunction of sacral region: Secondary | ICD-10-CM | POA: Diagnosis not present

## 2020-09-02 DIAGNOSIS — M9903 Segmental and somatic dysfunction of lumbar region: Secondary | ICD-10-CM

## 2020-09-02 DIAGNOSIS — M5412 Radiculopathy, cervical region: Secondary | ICD-10-CM | POA: Diagnosis not present

## 2020-09-02 NOTE — Patient Instructions (Signed)
Great to see you  Watch the overhead lifting Try to do the exercises as a cool down See me again in 6-8 weeks

## 2020-09-02 NOTE — Assessment & Plan Note (Signed)
Patient likely is not having any radicular symptoms but does seem to have a chronic problem with exacerbation.  Patient did do a lot of overhead likely contributed.  Patient has been prescribed Zanaflex previously.  Responded well to osteopathic manipulation.  Discussed which activities to do which wants to avoid.  Increase activity slowly.  Follow-up with me again in 6 to 8 weeks patient knows to call if any radicular symptoms do occur.

## 2020-09-08 ENCOUNTER — Telehealth: Payer: Self-pay | Admitting: Gastroenterology

## 2020-09-08 NOTE — Telephone Encounter (Signed)
OK to transfer care to me and OK for direct colonoscopy.

## 2020-09-08 NOTE — Telephone Encounter (Signed)
Hi Dr. Fuller Plan,   Patient called requesting a transfer of care from Dr. Watt Climes over to you. 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   Records are available for review in Epic.  Please advise on scheduling.  Thank you

## 2020-09-10 ENCOUNTER — Encounter: Payer: Self-pay | Admitting: Family Medicine

## 2020-09-12 NOTE — Telephone Encounter (Signed)
Spoke with patient he will call back on Monday.

## 2020-10-10 ENCOUNTER — Ambulatory Visit: Payer: 59 | Attending: Internal Medicine

## 2020-10-10 DIAGNOSIS — Z23 Encounter for immunization: Secondary | ICD-10-CM

## 2020-10-10 NOTE — Progress Notes (Signed)
   [id number]  Name:  Stephen [name].    MRN: [id number] DOB: 06/20/1960  10/10/2020  [name] was observed post Covid-19 immunization for 15 minutes without incident. He was provided with Vaccine Information Sheet and instruction to access the V-Safe system.   [name] was instructed to call 911 with any severe reactions post vaccine: Difficulty breathing  Swelling of face and throat  A fast heartbeat  A bad rash all over body  Dizziness and weakness   Immunizations Administered     Name Date Dose VIS Date Route   PFIZER Comrnaty(Gray TOP) Covid-19 Vaccine 10/10/2020 12:57 PM 0.3 mL 02/28/2020 Intramuscular   Manufacturer: [place]   Lot: [id number]   [place]: [phone]

## 2020-10-13 ENCOUNTER — Other Ambulatory Visit (HOSPITAL_BASED_OUTPATIENT_CLINIC_OR_DEPARTMENT_OTHER): Payer: Self-pay

## 2020-10-13 MED ORDER — COVID-19 MRNA VAC-TRIS(PFIZER) 30 MCG/0.3ML IM SUSP
INTRAMUSCULAR | 0 refills | Status: DC
Start: 2020-10-10 — End: 2020-12-08
  Filled 2020-10-13: qty 0.3, 1d supply, fill #0

## 2020-10-14 NOTE — Progress Notes (Signed)
[place] Phone: [phone] Subjective:   Fontaine No, am serving as a scribe for Dr. Hulan Saas.  This visit occurred during the SARS-CoV-2 public health emergency.  Safety protocols were in place, including screening questions prior to the visit, additional usage of staff PPE, and extensive cleaning of exam room while observing appropriate contact time as indicated for disinfecting solutions.    I'm seeing this patient by the request  of:  [name], MD  CC: Neck pain and back pain follow-up  [id number]  Jeffie Woolford. is a 60 y.o. male coming in with complaint of back and neck pain. OMT 09/02/2020. Patient states that he received second booster on Friday. Otherwise, patient has been doing ok. No issues with his back.  Patient states that overall has been doing relatively well.  Still having though some mild discomfort more in the upper neck and the upper thoracic spine.  Patient states that had swelling of the lymph nodes on the left side and thinks that could have contributed to some mild increase in aches and pains.  Medications patient has been prescribed: Zanaflex  Taking: Intermittently        Past Medical History:  Diagnosis Date   Colon polyps    Detached retina, right 57   Dr. [name] at [facility] did orbital band procedure with good results.    High cholesterol    Hypertension    Poison ivy dermatitis 07/29/2015    No Known Allergies   Review of Systems:  No headache, visual changes, nausea, vomiting, diarrhea, constipation, dizziness, abdominal pain, skin rash, fevers, chills, night sweats, weight loss, swollen lymph nodes, body aches, joint swelling, chest pain, shortness of breath, mood changes. POSITIVE muscle aches  Objective  Blood pressure 132/84, pulse 79, height '6\' 3"'$  (1.905 m), weight 210 lb (95.3 kg), SpO2 99 %.   General: No apparent distress alert and  oriented x3 mood and affect normal, dressed appropriately.  HEENT: Pupils equal, extraocular movements intact  Respiratory: Patient's speak in full sentences and does not appear short of breath  Cardiovascular: No lower extremity edema, non tender, no erythema  Neck exam does show the patient does have some tightness noted on the left side of the neck.  No significant radicular symptoms though.  Patient does have 5 out of 5 strength of the upper extremities.  Tightness noted also in the thoracolumbar juncture more than usual.  Seems to be more right greater than left.  Tenderness over the right sacroiliac joint minorly.  Mild loss of lordosis.  Osteopathic findings  C2 flexed rotated and side bent right C7 flexed rotated and side bent left T3 extended rotated and side bent right inhaled rib T8 extended rotated and side bent left L1 flexed rotated and side bent left Sacrum right on right       Assessment and Plan:   Cervical radiculopathy at C8 Patient has had tightness previously.  He does have some very mild lymphadenopathy noted on the left side of the neck today that is likely still secondary to the vaccine response.  Discussed with patient about icing regimen and home exercises, which activities to do which wants to avoid.  Increase activity slowly.  Follow-up with me again in 6 to 8 weeks.   Nonallopathic problems  Decision today to treat with OMT was based on Physical Exam  After verbal consent patient was treated with HVLA, ME, FPR techniques in cervical,  rib, thoracic, lumbar, and sacral  areas  Patient tolerated the procedure well with improvement in symptoms  Patient given exercises, stretches and lifestyle modifications  See medications in patient instructions if given  Patient will follow up in 4-8 weeks      The above documentation has been reviewed and is accurate and complete Lyndal Pulley, DO        Note: This dictation was prepared with Dragon  dictation along with smaller phrase technology. Any transcriptional errors that result from this process are unintentional.

## 2020-10-15 ENCOUNTER — Ambulatory Visit: Payer: 59 | Admitting: Family Medicine

## 2020-10-15 ENCOUNTER — Encounter: Payer: Self-pay | Admitting: Family Medicine

## 2020-10-15 ENCOUNTER — Other Ambulatory Visit: Payer: Self-pay

## 2020-10-15 VITALS — BP 132/84 | HR 79 | Ht 75.0 in | Wt 210.0 lb

## 2020-10-15 DIAGNOSIS — M9904 Segmental and somatic dysfunction of sacral region: Secondary | ICD-10-CM | POA: Diagnosis not present

## 2020-10-15 DIAGNOSIS — M9901 Segmental and somatic dysfunction of cervical region: Secondary | ICD-10-CM | POA: Diagnosis not present

## 2020-10-15 DIAGNOSIS — M9908 Segmental and somatic dysfunction of rib cage: Secondary | ICD-10-CM

## 2020-10-15 DIAGNOSIS — M9902 Segmental and somatic dysfunction of thoracic region: Secondary | ICD-10-CM | POA: Diagnosis not present

## 2020-10-15 DIAGNOSIS — M5412 Radiculopathy, cervical region: Secondary | ICD-10-CM | POA: Diagnosis not present

## 2020-10-15 NOTE — Assessment & Plan Note (Signed)
Patient has had tightness previously.  He does have some very mild lymphadenopathy noted on the left side of the neck today that is likely still secondary to the vaccine response.  Discussed with patient about icing regimen and home exercises, which activities to do which wants to avoid.  Increase activity slowly.  Follow-up with me again in 6 to 8 weeks.

## 2020-10-15 NOTE — Patient Instructions (Signed)
Great to see you Lymph node pain should resolve soon See me in 6-7 weeks

## 2020-10-27 DIAGNOSIS — H5211 Myopia, right eye: Secondary | ICD-10-CM | POA: Diagnosis not present

## 2020-10-27 DIAGNOSIS — H524 Presbyopia: Secondary | ICD-10-CM | POA: Diagnosis not present

## 2020-10-27 DIAGNOSIS — H5202 Hypermetropia, left eye: Secondary | ICD-10-CM | POA: Diagnosis not present

## 2020-10-27 DIAGNOSIS — H43811 Vitreous degeneration, right eye: Secondary | ICD-10-CM | POA: Diagnosis not present

## 2020-10-27 DIAGNOSIS — H2513 Age-related nuclear cataract, bilateral: Secondary | ICD-10-CM | POA: Diagnosis not present

## 2020-10-28 ENCOUNTER — Other Ambulatory Visit (HOSPITAL_BASED_OUTPATIENT_CLINIC_OR_DEPARTMENT_OTHER): Payer: Self-pay

## 2020-10-28 ENCOUNTER — Other Ambulatory Visit: Payer: Self-pay | Admitting: Family Medicine

## 2020-10-29 ENCOUNTER — Other Ambulatory Visit (HOSPITAL_BASED_OUTPATIENT_CLINIC_OR_DEPARTMENT_OTHER): Payer: Self-pay

## 2020-10-29 MED ORDER — VENLAFAXINE HCL ER 37.5 MG PO CP24
ORAL_CAPSULE | ORAL | 1 refills | Status: DC
  Filled 2020-10-29: qty 180, 90d supply, fill #0
  Filled 2021-01-27: qty 180, 90d supply, fill #1

## 2020-11-12 ENCOUNTER — Other Ambulatory Visit (HOSPITAL_BASED_OUTPATIENT_CLINIC_OR_DEPARTMENT_OTHER): Payer: Self-pay

## 2020-11-21 NOTE — Progress Notes (Signed)
  Stephen [name] [address] [place] Phone: [phone] Subjective:   Stephen [name], am serving as a scribe for Dr. Hulan Saas. This visit occurred during the SARS-CoV-2 public health emergency.  Safety protocols were in place, including screening questions prior to the visit, additional usage of staff PPE, and extensive cleaning of exam room while observing appropriate contact time as indicated for disinfecting solutions.   I'm seeing this patient by the request  of:  [name], MD  CC: Neck pain follow-up  [id number]  Stephen Moring. is a 60 y.o. male coming in with complaint of back and neck pain. OMT 10/15/2020. Patient states is his back feels about the same. No new complaints or injuries.  Patient is doing relatively well.  Some mild tightness but nothing severe.  Not working out on a regular basis.  Medications patient has been prescribed: Effexor       Past Medical History:  Diagnosis Date   Colon polyps    Detached retina, right 57   Dr. [name] at [facility] did orbital band procedure with good results.    High cholesterol    Hypertension    Poison ivy dermatitis 07/29/2015    No Known Allergies   Review of Systems:  No headache, visual changes, nausea, vomiting, diarrhea, constipation, dizziness, abdominal pain, skin rash, fevers, chills, night sweats, weight loss, swollen lymph nodes, body aches, joint swelling, chest pain, shortness of breath, mood changes. POSITIVE muscle aches  Objective  Blood pressure 136/78, pulse 67, height '6\' 3"'$  (1.905 m), weight 211 lb (95.7 kg), SpO2 98 %.   General: No apparent distress alert and oriented x3 mood and affect normal, dressed appropriately.  HEENT: Pupils equal, extraocular movements intact  Respiratory: Patient's speak in full sentences and does not appear short of breath  Cardiovascular: No lower extremity edema, non tender, no erythema  Neck exam does have  some mild loss of lordosis.  Some tightness noted on sidebending bilaterally.  Negative Spurling's is noted.  Patient has 5-5 strength of the upper extremities.  Back exam does have some tightness in the paraspinal musculature.  Osteopathic findings  C5 flexed rotated and side bent right T9 extended rotated and side bent left L2 flexed rotated and side bent right Sacrum right on right       Assessment and Plan:  Cervical radiculopathy at C8 Likely patient is not having any true radicular symptoms at this moment.  Discussed icing regimen and home exercises.  Discussed avoiding certain activities.  Increase activity slowly.  Follow-up again in 6 to 8 weeks.   Nonallopathic problems  Decision today to treat with OMT was based on Physical Exam  After verbal consent patient was treated with HVLA, ME, FPR techniques in cervical,  thoracic, lumbar, and sacral  areas  Patient tolerated the procedure well with improvement in symptoms  Patient given exercises, stretches and lifestyle modifications  See medications in patient instructions if given  Patient will follow up in 6-8 weeks      The above documentation has been reviewed and is accurate and complete Stephen Pulley, DO        Note: This dictation was prepared with Dragon dictation along with smaller phrase technology. Any transcriptional errors that result from this process are unintentional.

## 2020-11-25 ENCOUNTER — Other Ambulatory Visit: Payer: Self-pay

## 2020-11-25 ENCOUNTER — Ambulatory Visit: Payer: 59 | Admitting: Family Medicine

## 2020-11-25 VITALS — BP 136/78 | HR 67 | Ht 75.0 in | Wt 211.0 lb

## 2020-11-25 DIAGNOSIS — M9903 Segmental and somatic dysfunction of lumbar region: Secondary | ICD-10-CM

## 2020-11-25 DIAGNOSIS — M9902 Segmental and somatic dysfunction of thoracic region: Secondary | ICD-10-CM

## 2020-11-25 DIAGNOSIS — M9901 Segmental and somatic dysfunction of cervical region: Secondary | ICD-10-CM

## 2020-11-25 DIAGNOSIS — M9904 Segmental and somatic dysfunction of sacral region: Secondary | ICD-10-CM

## 2020-11-25 DIAGNOSIS — M5412 Radiculopathy, cervical region: Secondary | ICD-10-CM

## 2020-11-25 NOTE — Patient Instructions (Signed)
If doing chest machine also do back machine. Remember to keep hands in peripheral vision Enjoy the grandbaby See you again in 6-8 weeks

## 2020-11-25 NOTE — Assessment & Plan Note (Signed)
Likely patient is not having any true radicular symptoms at this moment.  Discussed icing regimen and home exercises.  Discussed avoiding certain activities.  Increase activity slowly.  Follow-up again in 6 to 8 weeks.

## 2020-12-08 ENCOUNTER — Other Ambulatory Visit (HOSPITAL_BASED_OUTPATIENT_CLINIC_OR_DEPARTMENT_OTHER): Payer: Self-pay

## 2020-12-08 ENCOUNTER — Other Ambulatory Visit: Payer: Self-pay

## 2020-12-08 ENCOUNTER — Ambulatory Visit (AMBULATORY_SURGERY_CENTER): Payer: 59 | Admitting: *Deleted

## 2020-12-08 VITALS — Ht 75.0 in | Wt 211.0 lb

## 2020-12-08 DIAGNOSIS — Z800 Family history of malignant neoplasm of digestive organs: Secondary | ICD-10-CM

## 2020-12-08 DIAGNOSIS — Z86010 Personal history of colonic polyps: Secondary | ICD-10-CM

## 2020-12-08 MED ORDER — NA SULFATE-K SULFATE-MG SULF 17.5-3.13-1.6 GM/177ML PO SOLN
1.0000 | ORAL | 0 refills | Status: DC
  Filled 2020-12-08: qty 354, 1d supply, fill #0

## 2020-12-08 NOTE — Progress Notes (Signed)

## 2020-12-10 ENCOUNTER — Encounter: Payer: Self-pay | Admitting: Gastroenterology

## 2020-12-26 ENCOUNTER — Encounter: Payer: Self-pay | Admitting: Gastroenterology

## 2020-12-26 ENCOUNTER — Ambulatory Visit (AMBULATORY_SURGERY_CENTER): Payer: 59 | Admitting: Gastroenterology

## 2020-12-26 ENCOUNTER — Other Ambulatory Visit: Payer: Self-pay

## 2020-12-26 VITALS — BP 134/79 | HR 61 | Temp 98.4°F | Resp 15 | Ht 75.0 in | Wt 211.0 lb

## 2020-12-26 DIAGNOSIS — Z1211 Encounter for screening for malignant neoplasm of colon: Secondary | ICD-10-CM | POA: Diagnosis not present

## 2020-12-26 DIAGNOSIS — Z800 Family history of malignant neoplasm of digestive organs: Secondary | ICD-10-CM | POA: Diagnosis not present

## 2020-12-26 DIAGNOSIS — D125 Benign neoplasm of sigmoid colon: Secondary | ICD-10-CM

## 2020-12-26 DIAGNOSIS — Z86010 Personal history of colonic polyps: Secondary | ICD-10-CM | POA: Diagnosis not present

## 2020-12-26 DIAGNOSIS — D120 Benign neoplasm of cecum: Secondary | ICD-10-CM | POA: Diagnosis not present

## 2020-12-26 DIAGNOSIS — I10 Essential (primary) hypertension: Secondary | ICD-10-CM | POA: Diagnosis not present

## 2020-12-26 MED ORDER — SODIUM CHLORIDE 0.9 % IV SOLN: 500.0000 mL | Freq: Once | INTRAVENOUS | Status: DC

## 2020-12-26 NOTE — Progress Notes (Signed)
Called to room to assist during endoscopic procedure.  Patient ID and intended procedure confirmed with present staff. Received instructions for my participation in the procedure from the performing physician.  

## 2020-12-26 NOTE — Progress Notes (Signed)
Report to PACU, RN, vss, BBS= Clear.  

## 2020-12-26 NOTE — Patient Instructions (Signed)
Handouts given on polyps and diverticulosis. Await pathology results. Repeat colonoscopy for surveillance will be determined based off of pathology results.   YOU HAD AN ENDOSCOPIC PROCEDURE TODAY AT [facility]:   Refer to the procedure report that was given to you for any specific questions about what was found during the examination.  If the procedure report does not answer your questions, please call your gastroenterologist to clarify.  If you requested that your care partner not be given the details of your procedure findings, then the procedure report has been included in a sealed envelope for you to review at your convenience later.  YOU SHOULD EXPECT: Some feelings of bloating in the abdomen. Passage of more gas than usual.  Walking can help get rid of the air that was put into your GI tract during the procedure and reduce the bloating. If you had a lower endoscopy (such as a colonoscopy or flexible sigmoidoscopy) you may notice spotting of blood in your stool or on the toilet paper. If you underwent a bowel prep for your procedure, you may not have a normal bowel movement for a few days.  Please Note:  You might notice some irritation and congestion in your nose or some drainage.  This is from the oxygen used during your procedure.  There is no need for concern and it should clear up in a day or so.  SYMPTOMS TO REPORT IMMEDIATELY:  Following lower endoscopy (colonoscopy or flexible sigmoidoscopy):  Excessive amounts of blood in the stool  Significant tenderness or worsening of abdominal pains  Swelling of the abdomen that is new, acute  Fever of 100F or higher  For urgent or emergent issues, a gastroenterologist can be reached at any hour by calling [phone]. Do not use MyChart messaging for urgent concerns.    DIET:  We do recommend a small meal at first, but then you may proceed to your regular diet.  Drink plenty of fluids but you should avoid alcoholic  beverages for 24 hours.  ACTIVITY:  You should plan to take it easy for the rest of today and you should NOT DRIVE or use heavy machinery until tomorrow (because of the sedation medicines used during the test).    FOLLOW UP: Our staff will call the number listed on your records 48-72 hours following your procedure to check on you and address any questions or concerns that you may have regarding the information given to you following your procedure. If we do not reach you, we will leave a message.  We will attempt to reach you two times.  During this call, we will ask if you have developed any symptoms of COVID 19. If you develop any symptoms (ie: fever, flu-like symptoms, shortness of breath, cough etc.) before then, please call [phone].  If you test positive for Covid 19 in the 2 weeks post procedure, please call and report this information to [place].    If any biopsies were taken you will be contacted by phone or by letter within the next 1-3 weeks.  Please call us at [phone] if you have not heard about the biopsies in 3 weeks.    SIGNATURES/CONFIDENTIALITY: You and/or your care partner have signed paperwork which will be entered into your electronic medical record.  These signatures attest to the fact that that the information above on your After Visit Summary has been reviewed and is understood.  Full responsibility of the confidentiality of this discharge information lies with you  and/or your care-partner.

## 2020-12-26 NOTE — Progress Notes (Signed)
History & Physical  Primary Care Physician:  [name], MD Primary Gastroenterologist: Lucio Sharmarke, MD  CHIEF COMPLAINT:  Personal history of colon polyps, family history of colon cancer.  HPI: Stephen [name]. is a 60 y.o. male with a personal history of sessile serrated polyps and tubular adenomatous polyps.  Family history of colon cancer.  Presenting for colonoscopy.    Past Medical History:  Diagnosis Date   Colon polyps    Detached retina, right 82   Dr. [name] at [facility] did orbital band procedure with good results.    High cholesterol    Hypertension    Poison ivy dermatitis 07/29/2015    Past Surgical History:  Procedure Laterality Date   COLONOSCOPY  04/2007   Dr.Magod   COLONOSCOPY  09/06/2014   Dr.Magod   RETINAL DETACHMENT SURGERY     30 years ago    Prior to Admission medications   Medication Sig Start Date End Date Taking? Authorizing Provider  fish oil-omega-3 fatty acids 1000 MG capsule Take 2 g by mouth daily.   Yes [provider]  lisinopril (ZESTRIL) 20 MG tablet TAKE 1 TABLET BY MOUTH ONCE DAILY 08/06/20 08/06/21 Yes [name], MD  Multiple Vitamin (MULTIVITAMIN) tablet Take 1 tablet by mouth daily.   Yes [provider]  Na Sulfate-K Sulfate-Mg Sulf 17.5-3.13-1.6 GM/177ML SOLN Take 1 kit by mouth as directed. 12/08/20  Yes Ladene Artist, MD  pravastatin (PRAVACHOL) 40 MG tablet TAKE 1 TABLET BY MOUTH EVERY NIGHT AT BEDTIME 08/06/20 08/06/21 Yes [name], MD  venlafaxine XR (EFFEXOR-XR) 37.5 MG 24 hr capsule TAKE 2 CAPSULES BY MOUTH ONCE DAILY WITH BREAKFAST 10/29/20 10/29/21 Yes Lyndal Pulley, DO    Current Outpatient Medications  Medication Sig Dispense Refill   fish oil-omega-3 fatty acids 1000 MG capsule Take 2 g by mouth daily.     lisinopril (ZESTRIL) 20 MG tablet TAKE 1 TABLET BY MOUTH ONCE DAILY 90 tablet 4   Multiple Vitamin (MULTIVITAMIN) tablet Take 1 tablet by mouth daily.      Na Sulfate-K Sulfate-Mg Sulf 17.5-3.13-1.6 GM/177ML SOLN Take 1 kit by mouth as directed. 354 mL 0   pravastatin (PRAVACHOL) 40 MG tablet TAKE 1 TABLET BY MOUTH EVERY NIGHT AT BEDTIME 90 tablet 4   venlafaxine XR (EFFEXOR-XR) 37.5 MG 24 hr capsule TAKE 2 CAPSULES BY MOUTH ONCE DAILY WITH BREAKFAST 180 capsule 1   Current Facility-Administered Medications  Medication Dose Route Frequency Provider Last Rate Last Admin   0.9 %  sodium chloride infusion  500 mL Intravenous Once Ladene Artist, MD        Allergies as of 12/26/2020   (No Known Allergies)    Family History  Problem Relation Age of Onset   Parkinsonism Mother    Colon polyps Father    Colon cancer Father 87   Cancer Father        colon cancer with mets   Hyperlipidemia Father    Hypertension Father    Hypertension Brother    Diabetes Neg Hx    Heart disease Neg Hx    Esophageal cancer Neg Hx    Rectal cancer Neg Hx    Stomach cancer Neg Hx     Social History   Socioeconomic History   Marital status: Married    Spouse name: Not on file   Number of children: 2   Years of education: 16   Highest education level: Not on file  Occupational History  Occupation: busines    Employer: [employer]  Tobacco Use   Smoking status: Never   Smokeless tobacco: Never  Vaping Use   Vaping Use: Never used  Substance and Sexual Activity   Alcohol use: Not Currently    Comment: occ beer   Drug use: No   Sexual activity: Yes    Partners: Female  Other Topics Concern   Not on file  Social History Narrative   HSG, [employer] - BS business admin. Married '84 - 1 son '86, 1 dtr -'43. 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 ACP discussed and he is referred to [email or web address].   Social Determinants of Health   Financial Resource Strain: Not on file  Food Insecurity: Not on file  Transportation Needs: Not on file  Physical Activity: Not on file   Stress: Not on file  Social Connections: Not on file  Intimate Partner Violence: Not on file    Review of Systems:  All systems reviewed an negative except where noted in HPI.  Gen: Denies any fever, chills, sweats, anorexia, fatigue, weakness, malaise, weight loss, and sleep disorder CV: Denies chest pain, angina, palpitations, syncope, orthopnea, PND, peripheral edema, and claudication. Resp: Denies dyspnea at rest, dyspnea with exercise, cough, sputum, wheezing, coughing up blood, and pleurisy. GI: Denies vomiting blood, jaundice, and fecal incontinence.   Denies dysphagia or odynophagia. GU : Denies urinary burning, blood in urine, urinary frequency, urinary hesitancy, nocturnal urination, and urinary incontinence. MS: Denies joint pain, limitation of movement, and swelling, stiffness, low back pain, extremity pain. Denies muscle weakness, cramps, atrophy.  Derm: Denies rash, itching, dry skin, hives, moles, warts, or unhealing ulcers.  Psych: Denies depression, anxiety, memory loss, suicidal ideation, hallucinations, paranoia, and confusion. Heme: Denies bruising, bleeding, and enlarged lymph nodes. Neuro:  Denies any headaches, dizziness, paresthesias. Endo:  Denies any problems with DM, thyroid, adrenal function.   Physical Exam: General:  Alert, well-developed, in NAD Head:  Normocephalic and atraumatic. Eyes:  Sclera clear, no icterus.   Conjunctiva pink. Ears:  Normal auditory acuity. Mouth:  No deformity or lesions.  Neck:  Supple; no masses . Lungs:  Clear throughout to auscultation.   No wheezes, crackles, or rhonchi. No acute distress. Heart:  Regular rate and rhythm; no murmurs. Abdomen:  Soft, nondistended, nontender. No masses, hepatomegaly. No obvious masses.  Normal bowel .    Rectal:  Deferred   Msk:  Symmetrical without gross deformities.. Pulses:  Normal pulses noted. Extremities:  Without edema. Neurologic:  Alert and  oriented x4;  grossly normal  neurologically. Skin:  Intact without significant lesions or rashes. Cervical Nodes:  No significant cervical adenopathy. Psych:  Alert and cooperative. Normal mood and affect.    Impression / Plan:   Personal history of sessile serrated polyps and tubular adenomatous polyps and family history of colon cancer, first-degree relative for colonoscopy.    This patient is appropriate for endoscopic procedures in the ambulatory setting.    Pricilla Riffle. Fuller Plan  12/26/2020, 10:19 AM See Shea Evans, [facility], to contact our on call provider

## 2020-12-26 NOTE — Op Note (Signed)
[facility] Patient Name: Stephen [name] Procedure Date: 12/26/2020 10:09 AM MRN: [id number] Endoscopist: Ladene Artist , MD Age: 60 Referring MD:  Date of Birth: 03-Feb-1961 Gender: Male Account #: [id number] Procedure:                Colonoscopy Indications:              High risk colon cancer surveillance: Personal                            history of sessile serrated colon polyp (less than                            10 mm in size) with no dysplasia and tubular                            adenomatous polyps. Family history of colon cancer,                            first degree relative. Medicines:                Monitored Anesthesia Care Procedure:                Pre-Anesthesia Assessment:                           - Prior to the procedure, a History and Physical                            was performed, and patient medications and                            allergies were reviewed. The patient's tolerance of                            previous anesthesia was also reviewed. The risks                            and benefits of the procedure and the sedation                            options and risks were discussed with the patient.                            All questions were answered, and informed consent                            was obtained. Prior Anticoagulants: The patient has                            taken no previous anticoagulant or antiplatelet                            agents. ASA Grade Assessment: II - A patient with  mild systemic disease. After reviewing the risks                            and benefits, the patient was deemed in                            satisfactory condition to undergo the procedure.                           After obtaining informed consent, the colonoscope                            was passed under direct vision. Throughout the                            procedure, the patient's blood pressure, pulse,  and                            oxygen saturations were monitored continuously. The                            Olympus CF-HQ190L (Serial# [id number]) Colonoscope was                            introduced through the anus and advanced to the the                            cecum, identified by appendiceal orifice and                            ileocecal valve. The ileocecal valve, appendiceal                            orifice, and rectum were photographed. The quality                            of the bowel preparation was good. The colonoscopy                            was performed without difficulty. The patient                            tolerated the procedure well. Scope In: 10:23:07 AM Scope Out: 10:38:31 AM Scope Withdrawal Time: 0 hours 13 minutes 21 seconds  Total Procedure Duration: 0 hours 15 minutes 24 seconds  Findings:                 The perianal and digital rectal examinations were                            normal.                           Three sessile polyps were found in the sigmoid  colon (1) and cecum (2). The polyps were 5 to 8 mm                            in size. These polyps were removed with a cold                            snare. Resection and retrieval were complete.                           A few small-mouthed diverticula were found in the                            left colon.                           The exam was otherwise without abnormality on                            direct and retroflexion views. Complications:            No immediate complications. Estimated blood loss:                            None. Estimated Blood Loss:     Estimated blood loss: none. Impression:               - Three 5 to 8 mm polyps in the sigmoid colon and                            in the cecum, removed with a cold snare. Resected                            and retrieved.                           - Mild diverticulosis in the left colon.                            - The examination was otherwise normal on direct                            and retroflexion views. Recommendation:           - Repeat colonoscopy after studies are complete for                            surveillance based on pathology results.                           - Patient has a contact number available for                            emergencies. The signs and symptoms of potential  delayed complications were discussed with the                            patient. Return to normal activities tomorrow.                            Written discharge instructions were provided to the                            patient.                           - Resume previous diet.                           - Continue present medications.                           - Await pathology results. Ladene Artist, MD 12/26/2020 10:44:26 AM This report has been signed electronically.

## 2020-12-26 NOTE — Progress Notes (Signed)
Pt's states no medical or surgical changes since previsit or office visit. 

## 2020-12-30 ENCOUNTER — Telehealth: Payer: Self-pay

## 2020-12-30 NOTE — Telephone Encounter (Signed)
  Follow up Call-  Call back number 12/26/2020  Post procedure Call Back phone  # [phone]  Permission to leave phone message Yes  Some recent data might be hidden     Patient questions:  Do you have a fever, pain , or abdominal swelling? No. Pain Score  0 *  Have you tolerated food without any problems? Yes.    Have you been able to return to your normal activities? Yes.    Do you have any questions about your discharge instructions: Diet   No. Medications  No. Follow up visit  No.  Do you have questions or concerns about your Care? No.  Actions: * If pain score is 4 or above: No action needed, pain <4.

## 2021-01-13 ENCOUNTER — Encounter: Payer: Self-pay | Admitting: Gastroenterology

## 2021-01-14 ENCOUNTER — Ambulatory Visit: Payer: 59 | Admitting: Family Medicine

## 2021-01-16 ENCOUNTER — Ambulatory Visit: Payer: 59 | Attending: Internal Medicine

## 2021-01-16 DIAGNOSIS — Z23 Encounter for immunization: Secondary | ICD-10-CM

## 2021-01-16 NOTE — Progress Notes (Signed)
   [facility]  Name:  Clearance Chenault.    MRN: [id number] DOB: Sep 14, 1960  01/16/2021  Mr. Greenough was observed post Covid-19 immunization for 15 minutes without incident. He was provided with Vaccine Information Sheet and instruction to access the V-Safe system.   Mr. Heeter was instructed to call 911 with any severe reactions post vaccine: Difficulty breathing  Swelling of face and throat  A fast heartbeat  A bad rash all over body  Dizziness and weakness   Immunizations Administered     Name Date Dose VIS Date Route   Pfizer Covid-19 Vaccine Bivalent Booster 01/16/2021  1:02 PM 0.3 mL 11/19/2020 Intramuscular   Manufacturer: [place]   Lot: [id number]   [place]: [phone]

## 2021-01-27 ENCOUNTER — Other Ambulatory Visit (HOSPITAL_BASED_OUTPATIENT_CLINIC_OR_DEPARTMENT_OTHER): Payer: Self-pay

## 2021-02-02 ENCOUNTER — Other Ambulatory Visit (HOSPITAL_BASED_OUTPATIENT_CLINIC_OR_DEPARTMENT_OTHER): Payer: Self-pay

## 2021-02-02 MED ORDER — CARESTART COVID-19 HOME TEST VI KIT
PACK | 0 refills | Status: DC
  Filled 2021-02-02: qty 2, 4d supply, fill #0

## 2021-02-06 ENCOUNTER — Other Ambulatory Visit (HOSPITAL_BASED_OUTPATIENT_CLINIC_OR_DEPARTMENT_OTHER): Payer: Self-pay

## 2021-02-09 ENCOUNTER — Other Ambulatory Visit (HOSPITAL_BASED_OUTPATIENT_CLINIC_OR_DEPARTMENT_OTHER): Payer: Self-pay

## 2021-02-09 MED ORDER — PFIZER COVID-19 VAC BIVALENT 30 MCG/0.3ML IM SUSP
INTRAMUSCULAR | 0 refills | Status: DC
  Filled 2021-02-09: qty 0.3, 1d supply, fill #0

## 2021-02-20 NOTE — Progress Notes (Signed)
[facility] [place] [address] [place] Phone: [phone] Subjective:   IVilma Meckel, am serving as a scribe for Dr. Hulan Saas. This visit occurred during the SARS-CoV-2 public health emergency.  Safety protocols were in place, including screening questions prior to the visit, additional usage of staff PPE, and extensive cleaning of exam room while observing appropriate contact time as indicated for disinfecting solutions.   I'm seeing this patient by the request  of:  [name], MD  CC: Neck and back pain follow-up  [id number]  Stephen [name]. is a 60 y.o. male coming in with complaint of back and neck pain. OMT 11/25/2020. Patient states right shoulder irritated. No new complaints.  Patient has been very active recently.  Nothing severe at the moment.  Patient has had some discomfort and pain but nothing that is stopping him from activity.  Medications patient has been prescribed: Effexor  Taking: Yes         Reviewed prior external information including notes and imaging from previsou exam, outside providers and external EMR if available.   As well as notes that were available from care everywhere and other healthcare systems.  Past medical history, social, surgical and family history all reviewed in electronic medical record.  No pertanent information unless stated regarding to the chief complaint.   Past Medical History:  Diagnosis Date   Colon polyps    Detached retina, right 77   Dr. [name] at [facility] did orbital band procedure with good results.    High cholesterol    Hypertension    Poison ivy dermatitis 07/29/2015    No Known Allergies    Objective  Blood pressure 132/78, pulse 81, height 6\' 3"  (1.905 m), weight 211 lb (95.7 kg), SpO2 97 %.   General: No apparent distress alert and oriented x3 mood and affect normal, dressed appropriately.  HEENT: Pupils equal, extraocular movements intact   Respiratory: Patient's speak in full sentences and does not appear short of breath  Cardiovascular: No lower extremity edema, non tender, no erythema  Neck exam does have some mild loss of lordosis.  Mild tightness noted in the right parascapular region and seems to be more in the trapezius.  Patient does have some tenderness to palpation at the inferior angle of the scapula.  Osteopathic findings  C2 flexed rotated and side bent right C6 flexed rotated and side bent left T5 extended rotated and side bent right inhaled rib T9 extended rotated and side bent left L2 flexed rotated and side bent right Sacrum right on right       Assessment and Plan: Cervical radiculopathy at C8 Patient overall is doing relatively well.  He is stable overall.  No significant changes.  Continue the Effexor at the low dose.  Follow-up with me again in  8 weeks.    Nonallopathic problems  Decision today to treat with OMT was based on Physical Exam  After verbal consent patient was treated with HVLA, ME, FPR techniques in cervical, rib, thoracic, lumbar, and sacral  areas  Patient tolerated the procedure well with improvement in symptoms  Patient given exercises, stretches and lifestyle modifications  See medications in patient instructions if given  Patient will follow up in 4-8 weeks      The above documentation has been reviewed and is accurate and complete Lyndal Pulley, DO        Note: This dictation was prepared with Dragon dictation along with smaller  Company secretary. Any transcriptional errors that result from this process are unintentional.

## 2021-02-21 ENCOUNTER — Emergency Department
Admission: EM | Admit: 2021-02-21 | Discharge: 2021-02-21 | Disposition: A | Discharge status: Home / Self Care | Payer: 59 | Source: Home / Self Care

## 2021-02-21 ENCOUNTER — Other Ambulatory Visit: Payer: Self-pay

## 2021-02-21 DIAGNOSIS — J309 Allergic rhinitis, unspecified: Secondary | ICD-10-CM | POA: Diagnosis not present

## 2021-02-21 DIAGNOSIS — J3489 Other specified disorders of nose and nasal sinuses: Secondary | ICD-10-CM

## 2021-02-21 DIAGNOSIS — J0100 Acute maxillary sinusitis, unspecified: Secondary | ICD-10-CM

## 2021-02-21 MED ORDER — FEXOFENADINE HCL 180 MG PO TABS: 180.0000 mg | ORAL_TABLET | Freq: Every day | ORAL | 0 refills | Status: DC

## 2021-02-21 MED ORDER — CEFDINIR 300 MG PO CAPS: 300.0000 mg | ORAL_CAPSULE | Freq: Two times a day (BID) | ORAL | 0 refills | Status: AC

## 2021-02-21 MED ORDER — PREDNISONE 20 MG PO TABS: ORAL_TABLET | ORAL | 0 refills | Status: DC

## 2021-02-21 NOTE — ED Triage Notes (Signed)
Pt states that he has some head and nasal congestion.  X5 days  Pt states that he is vaccinated for covid.  Pt states that he has had his flu vaccine.   Pt had negative covid test 3 days ago. 02/18/2021

## 2021-02-21 NOTE — Discharge Instructions (Addendum)
Advised patient to take medication as directed with food to completion.  Advised patient to take Prednisone and Allegra with first dose of Cefdinir for 5 of 7 days.  Encouraged patient to increase daily water intake while taking these medications, preferably 48 ounces per day.

## 2021-02-21 NOTE — ED Provider Notes (Signed)
[name]    CSN: [id number] Arrival date & time: 02/21/21  1045      History   Chief Complaint Chief Complaint  Patient presents with   Nasal Congestion    X5 days Nasal congestion, and head congestion    HPI Stephen Kun. is a 60 y.o. male.   HPI 60 year old male presents with sinus nasal congestion for 5 days.  Patient reports is vaccinated for influenza and COVID-19.  Past Medical History:  Diagnosis Date   Colon polyps    Detached retina, right 16   Dr. [name] at [facility] did orbital band procedure with good results.    High cholesterol    Hypertension    Poison ivy dermatitis 07/29/2015    Patient Active Problem List   Diagnosis Date Noted   Heme positive stool 08/06/2020   Hordeolum externum of right upper eyelid 08/06/2020   Right hand pain 11/15/2017   Dermatitis due to drug reaction 09/02/2017   Drug-induced erythroderma 09/02/2017   Laryngitis, acute 08/04/2017   Nonallopathic lesion of cervical region 04/06/2017   Nonallopathic lesion of thoracic region 04/06/2017   Nonallopathic lesion of lumbosacral region 04/06/2017   Cervical radiculopathy at C8 03/04/2017   Excessive cerumen in both ear canals 02/15/2017   Neuropathy, cervical plexus 02/15/2017   Poison ivy dermatitis 07/29/2015   Pharyngitis 09/26/2013   Back strain 06/11/2013   Healthcare maintenance 04/25/2011   Mixed hyperlipidemia 03/14/2010   Essential hypertension 03/14/2010    Past Surgical History:  Procedure Laterality Date   COLONOSCOPY  04/2007   Dr.Magod   COLONOSCOPY  09/06/2014   Dr.Magod   RETINAL DETACHMENT SURGERY     30 years ago       Home Medications    Prior to Admission medications   Medication Sig Start Date End Date Taking? Authorizing Provider  cefdinir (OMNICEF) 300 MG capsule Take 1 capsule (300 mg total) by mouth 2 (two) times daily for 7 days. 02/21/21 02/28/21 Yes Eliezer Lofts, FNP  fexofenadine [facility] ALLERGY) 180 MG tablet Take  1 tablet (180 mg total) by mouth daily for 15 days. 02/21/21 03/08/21 Yes Eliezer Lofts, FNP  fish oil-omega-3 fatty acids 1000 MG capsule Take 2 g by mouth daily.   Yes [provider]  lisinopril (ZESTRIL) 20 MG tablet TAKE 1 TABLET BY MOUTH ONCE DAILY 08/06/20 08/06/21 Yes [name], MD  Multiple Vitamin (MULTIVITAMIN) tablet Take 1 tablet by mouth daily.   Yes [provider]  pravastatin (PRAVACHOL) 40 MG tablet TAKE 1 TABLET BY MOUTH EVERY NIGHT AT BEDTIME 08/06/20 08/06/21 Yes [name], MD  predniSONE (DELTASONE) 20 MG tablet Take 3 tabs PO daily x 5 days. 02/21/21  Yes Eliezer Lofts, FNP  venlafaxine XR (EFFEXOR-XR) 37.5 MG 24 hr capsule TAKE 2 CAPSULES BY MOUTH ONCE DAILY WITH BREAKFAST 10/29/20 10/29/21 Yes Lyndal Pulley, DO  COVID-19 At Home Antigen Test [facility] COVID-19 HOME TEST) KIT Use as directed. 02/02/21   Clementeen Graham, RPH  COVID-19 mRNA bivalent vaccine, Pfizer, (PFIZER COVID-19 VAC BIVALENT) injection Inject into the muscle. 01/16/21   Carlyle Basques, MD  Na Sulfate-K Sulfate-Mg Sulf 17.5-3.13-1.6 GM/177ML SOLN Take 1 kit by mouth as directed. 12/08/20   Ladene Artist, MD    Family History Family History  Problem Relation Age of Onset   Parkinsonism Mother    Colon polyps Father    Colon cancer Father 76   Cancer Father        colon  cancer with mets   Hyperlipidemia Father    Hypertension Father    Hypertension Brother    Diabetes Neg Hx    Heart disease Neg Hx    Esophageal cancer Neg Hx    Rectal cancer Neg Hx    Stomach cancer Neg Hx     Social History Social History   Tobacco Use   Smoking status: Never   Smokeless tobacco: Never  Vaping Use   Vaping Use: Never used  Substance Use Topics   Alcohol use: Not Currently    Comment: occ beer   Drug use: No     Allergies   Patient has no known allergies.   Review of Systems Review of Systems  HENT:  Positive for congestion.   All other systems  reviewed and are negative.   Physical Exam Triage Vital Signs ED Triage Vitals  Enc Vitals Group     BP 02/21/21 1159 (!) 151/84     Pulse Rate 02/21/21 1159 78     Resp 02/21/21 1159 18     Temp 02/21/21 1159 97.9 F (36.6 C)     Temp Source 02/21/21 1159 Oral     SpO2 02/21/21 1159 100 %     Weight 02/21/21 1138 210 lb (95.3 kg)     Height 02/21/21 1138 6' 3"  (1.905 m)     Head Circumference --      Peak Flow --      Pain Score 02/21/21 1137 0     Pain Loc --      Pain Edu? --      Excl. in [place]? --    No data found.  Updated Vital Signs BP (!) 151/84 (BP Location: Left Arm)   Pulse 78   Temp 97.9 F (36.6 C) (Oral)   Resp 18   Ht 6' 3"  (1.905 m)   Wt 210 lb (95.3 kg)   SpO2 100%   BMI 26.25 kg/m      Physical Exam Vitals and nursing note reviewed.  Constitutional:      General: He is not in acute distress.    Appearance: Normal appearance. He is normal weight. He is ill-appearing.  HENT:     Head: Normocephalic and atraumatic.     Right Ear: Tympanic membrane, ear canal and external ear normal.     Left Ear: Tympanic membrane, ear canal and external ear normal.     Mouth/Throat:     Mouth: Mucous membranes are moist.     Pharynx: Oropharynx is clear.  Eyes:     Extraocular Movements: Extraocular movements intact.     Conjunctiva/sclera: Conjunctivae normal.     Pupils: Pupils are equal, round, and reactive to light.  Cardiovascular:     Rate and Rhythm: Normal rate and regular rhythm.     Pulses: Normal pulses.     Heart sounds: Normal heart sounds.  Pulmonary:     Effort: Pulmonary effort is normal.     Breath sounds: Normal breath sounds.  Musculoskeletal:     Cervical back: Normal range of motion and neck supple.  Skin:    General: Skin is warm and dry.  Neurological:     General: No focal deficit present.     Mental Status: He is alert and oriented to person, place, and time. Mental status is at baseline.     UC Treatments / Results   Labs (all labs ordered are listed, but only abnormal results are displayed) Labs Reviewed - No data  to display  EKG   Radiology No results found.  Procedures Procedures (including critical care time)  Medications Ordered in UC Medications - No data to display  Initial Impression / Assessment and Plan / UC Course  I have reviewed the triage vital signs and the nursing notes.  Pertinent labs & imaging results that were available during my care of the patient were reviewed by me and considered in my medical decision making (see chart for details).     MDM: 1.  Acute maxillary sinusitis-Rx'd Cefdinir; 2.  Sinus pressure-Rx'd Prednisone; 3.  Allergic rhinitis-Rx'd Allegra. Advised patient to take medication as directed with food to completion.  Advised patient to take Prednisone and Allegra with first dose of Cefdinir for 5 of 7 days.  Encouraged patient to increase daily water intake while taking these medications, preferably 48 ounces per day.  Patient discharged home, hemodynamically stable. Final Clinical Impressions(s) / UC Diagnoses   Final diagnoses:  Acute maxillary sinusitis, recurrence not specified  Allergic rhinitis, unspecified seasonality, unspecified trigger  Sinus pressure     Discharge Instructions      Advised patient to take medication as directed with food to completion.  Advised patient to take Prednisone and Allegra with first dose of Cefdinir for 5 of 7 days.  Encouraged patient to increase daily water intake while taking these medications, preferably 48 ounces per day.     ED Prescriptions     Medication Sig Dispense Auth. Provider   cefdinir (OMNICEF) 300 MG capsule Take 1 capsule (300 mg total) by mouth 2 (two) times daily for 7 days. 14 capsule Eliezer Lofts, FNP   predniSONE (DELTASONE) 20 MG tablet Take 3 tabs PO daily x 5 days. 15 tablet Eliezer Lofts, FNP   fexofenadine [facility] ALLERGY) 180 MG tablet Take 1 tablet (180 mg total) by mouth  daily for 15 days. 15 tablet Eliezer Lofts, FNP      PDMP not reviewed this encounter.   Eliezer Lofts, [place] 02/21/21 1327

## 2021-02-23 ENCOUNTER — Ambulatory Visit: Payer: 59 | Admitting: Family Medicine

## 2021-02-23 ENCOUNTER — Other Ambulatory Visit: Payer: Self-pay

## 2021-02-23 VITALS — BP 132/78 | HR 81 | Ht 75.0 in | Wt 211.0 lb

## 2021-02-23 DIAGNOSIS — M9903 Segmental and somatic dysfunction of lumbar region: Secondary | ICD-10-CM | POA: Diagnosis not present

## 2021-02-23 DIAGNOSIS — M9904 Segmental and somatic dysfunction of sacral region: Secondary | ICD-10-CM | POA: Diagnosis not present

## 2021-02-23 DIAGNOSIS — M5412 Radiculopathy, cervical region: Secondary | ICD-10-CM | POA: Diagnosis not present

## 2021-02-23 DIAGNOSIS — M9908 Segmental and somatic dysfunction of rib cage: Secondary | ICD-10-CM | POA: Diagnosis not present

## 2021-02-23 DIAGNOSIS — M9902 Segmental and somatic dysfunction of thoracic region: Secondary | ICD-10-CM

## 2021-02-23 DIAGNOSIS — M9901 Segmental and somatic dysfunction of cervical region: Secondary | ICD-10-CM

## 2021-02-23 NOTE — Patient Instructions (Signed)
Good to see you! Whatever you're doing keep it up See you again in 2 months Keep it up

## 2021-02-23 NOTE — Assessment & Plan Note (Signed)
Patient overall is doing relatively well.  He is stable overall.  No significant changes.  Continue the Effexor at the low dose.  Follow-up with me again in  8 weeks.

## 2021-04-04 ENCOUNTER — Emergency Department (HOSPITAL_BASED_OUTPATIENT_CLINIC_OR_DEPARTMENT_OTHER)
Admission: EM | Admit: 2021-04-04 | Discharge: 2021-04-04 | Disposition: A | Discharge status: Home / Self Care | Payer: 59 | Attending: Emergency Medicine | Admitting: Emergency Medicine

## 2021-04-04 ENCOUNTER — Emergency Department (HOSPITAL_BASED_OUTPATIENT_CLINIC_OR_DEPARTMENT_OTHER): Payer: 59

## 2021-04-04 ENCOUNTER — Other Ambulatory Visit: Payer: Self-pay

## 2021-04-04 ENCOUNTER — Encounter (HOSPITAL_BASED_OUTPATIENT_CLINIC_OR_DEPARTMENT_OTHER): Payer: Self-pay | Admitting: Emergency Medicine

## 2021-04-04 DIAGNOSIS — W1789XA Other fall from one level to another, initial encounter: Secondary | ICD-10-CM | POA: Insufficient documentation

## 2021-04-04 DIAGNOSIS — S52614A Nondisplaced fracture of right ulna styloid process, initial encounter for closed fracture: Secondary | ICD-10-CM | POA: Insufficient documentation

## 2021-04-04 DIAGNOSIS — S6991XA Unspecified injury of right wrist, hand and finger(s), initial encounter: Secondary | ICD-10-CM | POA: Diagnosis present

## 2021-04-04 DIAGNOSIS — M7989 Other specified soft tissue disorders: Secondary | ICD-10-CM | POA: Diagnosis not present

## 2021-04-04 DIAGNOSIS — S52571A Other intraarticular fracture of lower end of right radius, initial encounter for closed fracture: Secondary | ICD-10-CM | POA: Diagnosis not present

## 2021-04-04 DIAGNOSIS — S52611A Displaced fracture of right ulna styloid process, initial encounter for closed fracture: Secondary | ICD-10-CM | POA: Diagnosis not present

## 2021-04-04 MED ORDER — ONDANSETRON 4 MG PO TBDP: 4.0000 mg | ORAL_TABLET | Freq: Three times a day (TID) | ORAL | 0 refills | Status: DC | PRN

## 2021-04-04 MED ORDER — HYDROMORPHONE HCL 1 MG/ML IJ SOLN: 1.0000 mg | Freq: Once | INTRAMUSCULAR | Status: DC

## 2021-04-04 MED ORDER — OXYCODONE HCL 5 MG PO TABS
5.0000 mg | ORAL_TABLET | ORAL | 0 refills | Status: DC | PRN
Start: 2021-04-04 — End: 2021-04-08

## 2021-04-04 MED ORDER — HYDROMORPHONE HCL 1 MG/ML IJ SOLN
0.5000 mg | Freq: Once | INTRAMUSCULAR | Status: AC
  Administered 2021-04-04: 0.5 mg via INTRAMUSCULAR
  Filled 2021-04-04: qty 1

## 2021-04-04 NOTE — ED Notes (Signed)
Lost balance on a approx 3 ft high step stool, fell onto garage floor, onto rt hand/forearm, Left wrist area has swelling and some deformity, elevation and ice pack initiated. Capillary refill of RUE digits WNL, states digits 1-5 all have "tingling" sensation. Strong Rt radial pulse easily palpated. Able to extend all fingers and indicate a "hitch hiking" sign on RUE digits. Comfort measures provided.

## 2021-04-04 NOTE — ED Notes (Signed)
Unable to get oral temp d/t pt just drank something

## 2021-04-04 NOTE — ED Provider Notes (Signed)
[place] EMERGENCY DEPARTMENT Provider Note   CSN: [id number] Arrival date & time: 04/04/21  1153     History  Chief Complaint  Patient presents with   Wrist Pain    Stephen [name]. is a 61 y.o. male here for evaluation for mechanical fall.  He lost balance as he was approximate 3 feet up on a stepstool.  Fell onto ground floor.  Denies hitting head, LOC anticoagulation.  Landed on outstretched right wrist.  Does occasionally feel tingling to his digits when he moves his wrist however is able to move it.  He does have some overlying swelling.  No midline C/T/L tenderness.  Nontender bilateral ribs, pelvis, shoulders, humerus.  No color change to extremities.  No Hand surgery/Surgical Ortho FU outpatient  HPI     Home Medications Prior to Admission medications   Medication Sig Start Date End Date Taking? Authorizing Provider  ondansetron (ZOFRAN-ODT) 4 MG disintegrating tablet Take 1 tablet (4 mg total) by mouth every 8 (eight) hours as needed for nausea or vomiting. 04/04/21  Yes Marquail Bradwell A, PA-C  oxyCODONE (ROXICODONE) 5 MG immediate release tablet Take 1 tablet (5 mg total) by mouth every 4 (four) hours as needed for severe pain. 04/04/21  Yes Kiko Ripp A, PA-C  COVID-19 At Home Antigen Test [facility] COVID-19 HOME TEST) KIT Use as directed. 02/02/21   Clementeen Graham, RPH  COVID-19 mRNA bivalent vaccine, Pfizer, (PFIZER COVID-19 VAC BIVALENT) injection Inject into the muscle. 01/16/21   Carlyle Basques, MD  fexofenadine [facility] ALLERGY) 180 MG tablet Take 1 tablet (180 mg total) by mouth daily for 15 days. 02/21/21 03/08/21  Eliezer Lofts, FNP  fish oil-omega-3 fatty acids 1000 MG capsule Take 2 g by mouth daily.    [provider]  lisinopril (ZESTRIL) 20 MG tablet TAKE 1 TABLET BY MOUTH ONCE DAILY 08/06/20 08/06/21  [name], MD  Multiple Vitamin (MULTIVITAMIN) tablet Take 1 tablet by mouth daily.    [provider]   Na Sulfate-K Sulfate-Mg Sulf 17.5-3.13-1.6 GM/177ML SOLN Take 1 kit by mouth as directed. 12/08/20   Ladene Artist, MD  pravastatin (PRAVACHOL) 40 MG tablet TAKE 1 TABLET BY MOUTH EVERY NIGHT AT BEDTIME 08/06/20 08/06/21  [name], MD  predniSONE (DELTASONE) 20 MG tablet Take 3 tabs PO daily x 5 days. 02/21/21   Eliezer Lofts, FNP  venlafaxine XR (EFFEXOR-XR) 37.5 MG 24 hr capsule TAKE 2 CAPSULES BY MOUTH ONCE DAILY WITH BREAKFAST 10/29/20 10/29/21  Lyndal Pulley, DO      Allergies    Patient has no known allergies.    Review of Systems   Review of Systems  Constitutional: Negative.   HENT: Negative.    Respiratory: Negative.    Cardiovascular: Negative.   Gastrointestinal: Negative.   Genitourinary: Negative.   Musculoskeletal:  Negative for neck pain and neck stiffness.       Wrist pain and swelling  Skin: Negative.   Neurological: Negative.   All other systems reviewed and are negative.  Physical Exam Updated Vital Signs BP 132/75    Pulse 83    Temp 97.8 F (36.6 C) (Axillary)    Resp 18    Ht _0  (1.905 m)    Wt 95.3 kg    SpO2 98%    BMI 26.25 kg/m  Physical Exam Vitals and nursing note reviewed.  Constitutional:      General: He is not in acute distress.    Appearance: He  is well-developed. He is not ill-appearing or diaphoretic.  HENT:     Head: Atraumatic. No raccoon eyes, Battle's sign, abrasion or contusion.     Jaw: There is normal jaw occlusion.     Comments: Nontender scalp.  No obvious traumatic injuries to head Eyes:     Pupils: Pupils are equal, round, and reactive to light.  Neck:     Trachea: Phonation normal.     Comments: Nontender cervical region, full range of motion without difficulty Cardiovascular:     Rate and Rhythm: Normal rate and regular rhythm.     Pulses: Normal pulses.          Radial pulses are 2+ on the right side and 2+ on the left side.     Heart sounds: Normal heart sounds.  Pulmonary:     Effort: Pulmonary  effort is normal. No respiratory distress.     Breath sounds: Normal breath sounds and air entry.     Comments: Clear bilaterally, speaks in full sentences without difficulty Chest:     Comments: Nontender chest wall, no crepitus or step-off Abdominal:     General: Bowel sounds are normal. There is no distension.     Palpations: Abdomen is soft.     Tenderness: There is no abdominal tenderness.     Comments: Soft, nontender.  No obvious traumatic injury, ecchymosis  Musculoskeletal:        General: Swelling, tenderness, deformity and signs of injury present.     Right shoulder: Normal.     Left shoulder: Normal.     Right upper arm: Normal.     Left upper arm: Normal.     Right elbow: Normal.     Left elbow: Normal.     Right forearm: Normal.     Left forearm: Normal.     Right wrist: Swelling, deformity, tenderness and bony tenderness present. No lacerations. Decreased range of motion.     Left wrist: Normal.     Right hand: Normal.     Left hand: Normal.       Hands:     Cervical back: Full passive range of motion without pain, normal range of motion and neck supple.     Comments: Tenderness to right wrist with overlying soft tissue swelling.  Nontender bilateral shoulders, humerus.  He is able to flex and extend at bilateral shoulders, olecranon.  Wiggles fingers without difficulty.  He has full range of motion to fingers.  He is able to perform ROM to right wrist however with pain.  No midline C/T/L tenderness.  Pelvis stable, nontender palpation.  Able to flex and extend at bilateral lower extremities.  Full range of motion left upper extremity.  Skin:    General: Skin is warm and dry.     Capillary Refill: Capillary refill takes less than 2 seconds.     Comments: Soft tissue swelling about right wrist, no ecchymosis, lacerations, abrasions  Neurological:     General: No focal deficit present.     Mental Status: He is alert and oriented to person, place, and time.      Cranial Nerves: Cranial nerves 2-12 are intact.     Sensory: Sensation is intact.     Motor: Motor function is intact.     Comments: Intact sensation, equal cap refill bilaterally    ED Results / Procedures / Treatments   Labs (all labs ordered are listed, but only abnormal results are displayed) Labs Reviewed - No data to display  EKG None  Radiology DG Wrist Complete Right  Result Date: 04/04/2021 CLINICAL DATA:  Patient fell from a stool landing on the right wrist. Pain and deformity. EXAM: RIGHT WRIST - COMPLETE 3+ VIEW COMPARISON:  None. FINDINGS: There is a comminuted fracture of the distal radius with intra-articular components. Several posterior fragments are displaced posteriorly up to 5 mm. There is also dorsal impaction leading to dorsal angulation of the distal radial articular surface of 24 degrees. There is an associated ulnar styloid fracture, from the styloid tip. Joints are normally aligned. There is surrounding soft tissue swelling. IMPRESSION: 1. Comminuted, mildly displaced and dorsally impacted intra-articular fracture of the distal radius with dorsal angulation of the articular surface of 24 degrees. Small associated fracture from the tip of the ulnar styloid. No dislocation. Electronically Signed   By: Lajean Manes M.D.   On: 04/04/2021 12:40    Procedures .Splint Application  Date/Time: 04/04/2021 2:21 PM Performed by: Shelby Dubin A, PA-C Authorized by: Nettie Elm, PA-C   Consent:    Consent obtained:  Verbal   Consent given by:  Patient   Risks, benefits, and alternatives were discussed: yes     Risks discussed:  Discoloration, numbness, pain and swelling   Alternatives discussed:  No treatment, delayed treatment, alternative treatment, observation and referral Universal protocol:    Procedure explained and questions answered to patient or proxy's satisfaction: yes     Relevant documents present and verified: yes     Test results available: yes      Imaging studies available: yes     Required blood products, implants, devices, and special equipment available: yes     Site/side marked: yes     Immediately prior to procedure a time out was called: yes     Patient identity confirmed:  Verbally with patient Pre-procedure details:    Distal neurologic exam:  Normal   Distal perfusion: distal pulses strong and brisk capillary refill   Procedure details:    Location:  Wrist   Wrist location:  R wrist   Strapping: no     Splint type:  Sugar tong   Supplies:  Sling, fiberglass, elastic bandage, aluminum splint and cotton padding   Attestation: Splint applied and adjusted personally by me   Post-procedure details:    Distal neurologic exam:  Normal   Distal perfusion: distal pulses strong and brisk capillary refill     Procedure completion:  Tolerated well, no immediate complications   Post-procedure imaging: not applicable      Medications Ordered in ED Medications  HYDROmorphone (DILAUDID) injection 0.5 mg (0.5 mg Intramuscular Given 04/04/21 1335)   ED Course/ Medical Decision Making/ A&P    Pleasant 61 year old here for evaluation after mechanical fall earlier today.  Fell on outstretched right wrist.  He has obvious swelling, deformity to right wrist.  He is neurovascularly intact.  No open wounds.  No midline C/T/L tenderness.  He denies any his head, LOC or anticoagulation.  Has been ambulatory since.  No bony tenderness right midshaft, proximal forearm, humerus.  Nontender bilateral hands.  Imaging personally viewed and interpreted does show comminuted impacted intra-articular radial fracture. Associated ulnar styloid fracture.  Patient pain managed with IM Dilaudid.  Was able to place splint, gently hold pressure to wrist.  Placed in sling.  Neurovascular intact after splint placement.  Patient will need to follow-up with hand surgery.  I provided resources for outpatient follow-up.  He will return for new or worsening  symptoms.  Patient and wife are agreeable.  The patient has been appropriately medically screened and/or stabilized in the ED. I have low suspicion for any other emergent medical condition which would require further screening, evaluation or treatment in the ED or require inpatient management.  Patient is hemodynamically stable and in no acute distress.  Patient able to ambulate in department prior to ED.  Evaluation does not show acute pathology that would require ongoing or additional emergent interventions while in the emergency department or further inpatient treatment.  I have discussed the diagnosis with the patient and answered all questions.  Pain is been managed while in the emergency department and patient has no further complaints prior to discharge.  Patient is comfortable with plan discussed in room and is stable for discharge at this time.  I have discussed strict return precautions for returning to the emergency department.  Patient was encouraged to follow-up with PCP/specialist refer to at discharge.                           Medical Decision Making Amount and/or Complexity of Data Reviewed Independent Historian: spouse External Data Reviewed: notes.    Details: sport med outpatient Radiology: ordered and independent interpretation performed. Decision-making details documented in ED Course.  Risk Parenteral controlled substances. Diagnosis or treatment significantly limited by social determinants of health. Risk Details: No outpatient surgical FU         Final Clinical Impression(s) / ED Diagnoses Final diagnoses:  Other closed intra-articular fracture of distal end of right radius, initial encounter  Closed nondisplaced fracture of styloid process of right ulna, initial encounter    Rx / DC Orders ED Discharge Orders          Ordered    oxyCODONE (ROXICODONE) 5 MG immediate release tablet  Every 4 hours PRN        04/04/21 1425    ondansetron (ZOFRAN-ODT) 4 MG  disintegrating tablet  Every 8 hours PRN        04/04/21 1425              Eeva Schlosser A, PA-C 04/04/21 1435    Malvin Johns, MD 04/04/21 1446

## 2021-04-04 NOTE — ED Triage Notes (Signed)
Pt reports fell off top step of 3- step stool, landing on RT wrist; +deformity

## 2021-04-04 NOTE — Discharge Instructions (Signed)
It was our pleasure taking care of you here in the emergency department today  Your x-ray showed a fracture of your wrist.  We have placed you in a splint.  Keep the splint on until you follow-up with orthopedics.  I have listed the number of an orthopedic hand surgery on your discharge paperwork.  Please call to schedule an appointment.  I have also written you for some pain medication.  Please use caution as this is a controlled substance.  This may make you sleepy.  Do not drive or operate heavy machinery while using this medication.  You may take additional Tylenol and ibuprofen while taking this medication  If you notice any increased swelling, color change to your hand, wrist, decreased sensation, inability to move your extremities please seek evaluation emergency department

## 2021-04-05 ENCOUNTER — Encounter: Payer: Self-pay | Admitting: Family Medicine

## 2021-04-06 ENCOUNTER — Other Ambulatory Visit: Payer: Self-pay

## 2021-04-06 ENCOUNTER — Telehealth: Payer: Self-pay | Admitting: Orthopedic Surgery

## 2021-04-06 DIAGNOSIS — M25531 Pain in right wrist: Secondary | ICD-10-CM

## 2021-04-06 NOTE — Telephone Encounter (Signed)
Tomorrow is fine

## 2021-04-06 NOTE — Telephone Encounter (Signed)
Pt was seen at [facility] point, has fractured hand/wrist. In temporary splint. Dr. Madelynn Done soonest available appt was later this week and he did not want to wait that long. I did work him in on the schedule tomorrow morning at 8:15 am. Wanted to inform you to see if he needed to come in sooner or not. Thank you.

## 2021-04-07 ENCOUNTER — Encounter: Payer: Self-pay | Admitting: Orthopedic Surgery

## 2021-04-07 ENCOUNTER — Ambulatory Visit (INDEPENDENT_AMBULATORY_CARE_PROVIDER_SITE_OTHER): Payer: 59

## 2021-04-07 ENCOUNTER — Other Ambulatory Visit: Payer: Self-pay

## 2021-04-07 ENCOUNTER — Ambulatory Visit: Payer: 59 | Admitting: Orthopedic Surgery

## 2021-04-07 ENCOUNTER — Encounter (HOSPITAL_COMMUNITY): Payer: Self-pay | Admitting: Orthopedic Surgery

## 2021-04-07 VITALS — BP 158/89 | HR 79 | Ht 75.0 in | Wt 210.1 lb

## 2021-04-07 DIAGNOSIS — S52571A Other intraarticular fracture of lower end of right radius, initial encounter for closed fracture: Secondary | ICD-10-CM | POA: Diagnosis not present

## 2021-04-07 DIAGNOSIS — S52501A Unspecified fracture of the lower end of right radius, initial encounter for closed fracture: Secondary | ICD-10-CM | POA: Insufficient documentation

## 2021-04-07 NOTE — H&P (View-Only) (Signed)
Office Visit Note   Patient: Stephen [name].           Date of Birth: 05/13/1960           MRN: [id number] Visit Date: 04/07/2021              Requested by: [name], MD [address] [place],  [place] [address] PCP: [name], MD   Assessment & Plan: Visit Diagnoses:  1. Other closed intra-articular fracture of distal end of right radius, initial encounter     Plan: We discussed the diagnosis, prognosis, and both conservative and operative treatment options for his severely comminuted, intra-articular distal radius fracture.  After our discussion, the patient has elected to proceed with ORIF.  He will likely need a bridge plate construct given the severe comminution.  We discussed the postoperative course involved with bridge plating including need for plate removal after fracture consolidation. We reviewed the benefits of surgery and the potential risks including, but not limited to, persistent symptoms, infection, damage to nearby nerves and blood vessels, delayed wound healing, nonunion, malunion, stiffness.    All patient concerns and questions were addressed.  A surgical date will be confirmed with the patient.    Follow-Up Instructions: No follow-ups on file.   Orders:  Orders Placed This Encounter  Procedures   XR Wrist Complete Right   No orders of the defined types were placed in this encounter.     Procedures: No procedures performed   Clinical Data: No additional findings.   Subjective: Chief Complaint  Patient presents with   Right Wrist - Fracture    DOI: 04/04/21, + n/t, swelling in fingers, pain 7-810, RIGHT Handed, Fell off stool in garage and reached out to catch himself.     This is a 61 year old right-hand-dominant male who presents for follow-up of from the ER with a right distal radius fracture.  He fell off a stool his garage on Saturday and try to catch himself by putting his right hand out.  He was seen in  the ER where he was found to have a severely comminuted right distal radius fracture.  He was placed in a sugar-tong splint.  His pain is well controlled the moment.  This does have some slight paresthesias in the long finger that have been stable since the injury.  His fingers are also moderately swollen.  He denies injury elsewhere.   Review of Systems   Objective: Vital Signs: BP (!) 158/89 (BP Location: Left Arm, Patient Position: Sitting)    Pulse 79    Ht 6\' 3"  (1.905 m)    Wt 210 lb 1.6 oz (95.3 kg)    BMI 26.26 kg/m   Physical Exam Constitutional:      Appearance: Normal appearance.  Cardiovascular:     Rate and Rhythm: Normal rate.     Pulses: Normal pulses.  Pulmonary:     Effort: Pulmonary effort is normal.  Skin:    General: Skin is warm.     Capillary Refill: Capillary refill takes less than 2 seconds.  Neurological:     Mental Status: He is alert.    Right Hand Exam   Other  Erythema: absent Sensation: normal Pulse: present  Comments:  Sugar tong splint clean and dry.  Able to weakly flex and extend fingers within limits of splint.      Specialty Comments:  No specialty comments available.  Imaging: XR Wrist Complete Right  Result Date: 04/07/2021 3  views of the right distal radius taken today are reviewed interpreted by me.  They demonstrate a comminuted fracture of the distal radius with several intra-articular components.  The radial height and dorsal angulation are improved today versus the prereduction x-rays taken in the ER.  There is a small fracture of the very tip of the ulnar styloid.    PMFS History: Patient Active Problem List   Diagnosis Date Noted   Closed fracture of right distal radius 04/07/2021   Heme positive stool 08/06/2020   Hordeolum externum of right upper eyelid 08/06/2020   Right hand pain 11/15/2017   Dermatitis due to drug reaction 09/02/2017   Drug-induced erythroderma 09/02/2017   Laryngitis, acute 08/04/2017    Nonallopathic lesion of cervical region 04/06/2017   Nonallopathic lesion of thoracic region 04/06/2017   Nonallopathic lesion of lumbosacral region 04/06/2017   Cervical radiculopathy at C8 03/04/2017   Excessive cerumen in both ear canals 02/15/2017   Neuropathy, cervical plexus 02/15/2017   Poison ivy dermatitis 07/29/2015   Pharyngitis 09/26/2013   Back strain 06/11/2013   Healthcare maintenance 04/25/2011   Mixed hyperlipidemia 03/14/2010   Essential hypertension 03/14/2010   Past Medical History:  Diagnosis Date   Colon polyps    Detached retina, right 1   Dr. [name] at [facility] did orbital band procedure with good results.    High cholesterol    Hypertension    Poison ivy dermatitis 07/29/2015    Family History  Problem Relation Age of Onset   Parkinsonism Mother    Colon polyps Father    Colon cancer Father 30   Cancer Father        colon cancer with mets   Hyperlipidemia Father    Hypertension Father    Hypertension Brother    Diabetes Neg Hx    Heart disease Neg Hx    Esophageal cancer Neg Hx    Rectal cancer Neg Hx    Stomach cancer Neg Hx     Past Surgical History:  Procedure Laterality Date   COLONOSCOPY  04/2007   Dr.Magod   COLONOSCOPY  09/06/2014   Dr.Magod   RETINAL DETACHMENT SURGERY     30 years ago   Social History   Occupational History   Occupation: busines    Employer: [employer]  Tobacco Use   Smoking status: Never   Smokeless tobacco: Never  Vaping Use   Vaping Use: Never used  Substance and Sexual Activity   Alcohol use: Not Currently    Comment: occ beer   Drug use: No   Sexual activity: Yes    Partners: Female

## 2021-04-07 NOTE — Progress Notes (Signed)
Office Visit Note   Patient: Stephen [name].           Date of Birth: 30-Jan-1961           MRN: [id number] Visit Date: 04/07/2021              Requested by: [name], MD [address] [place],  [place] [address] PCP: [name], MD   Assessment & Plan: Visit Diagnoses:  1. Other closed intra-articular fracture of distal end of right radius, initial encounter     Plan: We discussed the diagnosis, prognosis, and both conservative and operative treatment options for his severely comminuted, intra-articular distal radius fracture.  After our discussion, the patient has elected to proceed with ORIF.  He will likely need a bridge plate construct given the severe comminution.  We discussed the postoperative course involved with bridge plating including need for plate removal after fracture consolidation. We reviewed the benefits of surgery and the potential risks including, but not limited to, persistent symptoms, infection, damage to nearby nerves and blood vessels, delayed wound healing, nonunion, malunion, stiffness.    All patient concerns and questions were addressed.  A surgical date will be confirmed with the patient.    Follow-Up Instructions: No follow-ups on file.   Orders:  Orders Placed This Encounter  Procedures   XR Wrist Complete Right   No orders of the defined types were placed in this encounter.     Procedures: No procedures performed   Clinical Data: No additional findings.   Subjective: Chief Complaint  Patient presents with   Right Wrist - Fracture    DOI: 04/04/21, + n/t, swelling in fingers, pain 7-810, RIGHT Handed, Fell off stool in garage and reached out to catch himself.     This is a 61 year old right-hand-dominant male who presents for follow-up of from the ER with a right distal radius fracture.  He fell off a stool his garage on Saturday and try to catch himself by putting his right hand out.  He was seen in  the ER where he was found to have a severely comminuted right distal radius fracture.  He was placed in a sugar-tong splint.  His pain is well controlled the moment.  This does have some slight paresthesias in the long finger that have been stable since the injury.  His fingers are also moderately swollen.  He denies injury elsewhere.   Review of Systems   Objective: Vital Signs: BP (!) 158/89 (BP Location: Left Arm, Patient Position: Sitting)    Pulse 79    Ht 6\' 3"  (1.905 m)    Wt 210 lb 1.6 oz (95.3 kg)    BMI 26.26 kg/m   Physical Exam Constitutional:      Appearance: Normal appearance.  Cardiovascular:     Rate and Rhythm: Normal rate.     Pulses: Normal pulses.  Pulmonary:     Effort: Pulmonary effort is normal.  Skin:    General: Skin is warm.     Capillary Refill: Capillary refill takes less than 2 seconds.  Neurological:     Mental Status: He is alert.    Right Hand Exam   Other  Erythema: absent Sensation: normal Pulse: present  Comments:  Sugar tong splint clean and dry.  Able to weakly flex and extend fingers within limits of splint.      Specialty Comments:  No specialty comments available.  Imaging: XR Wrist Complete Right  Result Date: 04/07/2021 3  views of the right distal radius taken today are reviewed interpreted by me.  They demonstrate a comminuted fracture of the distal radius with several intra-articular components.  The radial height and dorsal angulation are improved today versus the prereduction x-rays taken in the ER.  There is a small fracture of the very tip of the ulnar styloid.    PMFS History: Patient Active Problem List   Diagnosis Date Noted   Closed fracture of right distal radius 04/07/2021   Heme positive stool 08/06/2020   Hordeolum externum of right upper eyelid 08/06/2020   Right hand pain 11/15/2017   Dermatitis due to drug reaction 09/02/2017   Drug-induced erythroderma 09/02/2017   Laryngitis, acute 08/04/2017    Nonallopathic lesion of cervical region 04/06/2017   Nonallopathic lesion of thoracic region 04/06/2017   Nonallopathic lesion of lumbosacral region 04/06/2017   Cervical radiculopathy at C8 03/04/2017   Excessive cerumen in both ear canals 02/15/2017   Neuropathy, cervical plexus 02/15/2017   Poison ivy dermatitis 07/29/2015   Pharyngitis 09/26/2013   Back strain 06/11/2013   Healthcare maintenance 04/25/2011   Mixed hyperlipidemia 03/14/2010   Essential hypertension 03/14/2010   Past Medical History:  Diagnosis Date   Colon polyps    Detached retina, right 47   Dr. [name] at [facility] did orbital band procedure with good results.    High cholesterol    Hypertension    Poison ivy dermatitis 07/29/2015    Family History  Problem Relation Age of Onset   Parkinsonism Mother    Colon polyps Father    Colon cancer Father 10   Cancer Father        colon cancer with mets   Hyperlipidemia Father    Hypertension Father    Hypertension Brother    Diabetes Neg Hx    Heart disease Neg Hx    Esophageal cancer Neg Hx    Rectal cancer Neg Hx    Stomach cancer Neg Hx     Past Surgical History:  Procedure Laterality Date   COLONOSCOPY  04/2007   Dr.Magod   COLONOSCOPY  09/06/2014   Dr.Magod   RETINAL DETACHMENT SURGERY     30 years ago   Social History   Occupational History   Occupation: busines    Employer: [employer]  Tobacco Use   Smoking status: Never   Smokeless tobacco: Never  Vaping Use   Vaping Use: Never used  Substance and Sexual Activity   Alcohol use: Not Currently    Comment: occ beer   Drug use: No   Sexual activity: Yes    Partners: Female

## 2021-04-08 ENCOUNTER — Ambulatory Visit (HOSPITAL_COMMUNITY)
Admission: RE | Admit: 2021-04-08 | Discharge: 2021-04-08 | Disposition: A | Discharge status: Home / Self Care | Payer: 59 | Attending: Orthopedic Surgery | Admitting: Orthopedic Surgery

## 2021-04-08 ENCOUNTER — Ambulatory Visit (HOSPITAL_COMMUNITY): Payer: 59 | Admitting: Certified Registered Nurse Anesthetist

## 2021-04-08 ENCOUNTER — Other Ambulatory Visit: Payer: Self-pay

## 2021-04-08 ENCOUNTER — Encounter (HOSPITAL_COMMUNITY)
Admission: RE | Disposition: A | Discharge status: Home / Self Care | Payer: Self-pay | Source: Home / Self Care | Attending: Orthopedic Surgery

## 2021-04-08 ENCOUNTER — Encounter (HOSPITAL_COMMUNITY): Payer: Self-pay | Admitting: Orthopedic Surgery

## 2021-04-08 ENCOUNTER — Ambulatory Visit (HOSPITAL_COMMUNITY): Payer: 59

## 2021-04-08 DIAGNOSIS — S52571A Other intraarticular fracture of lower end of right radius, initial encounter for closed fracture: Secondary | ICD-10-CM | POA: Insufficient documentation

## 2021-04-08 DIAGNOSIS — W11XXXA Fall on and from ladder, initial encounter: Secondary | ICD-10-CM | POA: Insufficient documentation

## 2021-04-08 DIAGNOSIS — I10 Essential (primary) hypertension: Secondary | ICD-10-CM | POA: Diagnosis not present

## 2021-04-08 DIAGNOSIS — Y92008 Other place in unspecified non-institutional (private) residence as the place of occurrence of the external cause: Secondary | ICD-10-CM | POA: Diagnosis not present

## 2021-04-08 DIAGNOSIS — S52501A Unspecified fracture of the lower end of right radius, initial encounter for closed fracture: Secondary | ICD-10-CM | POA: Diagnosis not present

## 2021-04-08 DIAGNOSIS — Z419 Encounter for procedure for purposes other than remedying health state, unspecified: Secondary | ICD-10-CM

## 2021-04-08 HISTORY — PX: OPEN REDUCTION INTERNAL FIXATION (ORIF) DISTAL RADIAL FRACTURE: SHX5989

## 2021-04-08 LAB — BASIC METABOLIC PANEL
Anion gap: 13 (ref 5–15)
BUN: 17 mg/dL (ref 6–20)
CO2: 28 mmol/L (ref 22–32)
Calcium: 9.5 mg/dL (ref 8.9–10.3)
Chloride: 101 mmol/L (ref 98–111)
Creatinine, Ser: 1 mg/dL (ref 0.61–1.24)
GFR, Estimated: >60 mL/min (ref >60)
Glucose, Bld: 98 mg/dL (ref 70–99)
Potassium: 4.5 mmol/L (ref 3.5–5.1)
Sodium: 142 mmol/L (ref 135–145)

## 2021-04-08 LAB — CBC
HCT: 42.8 % (ref 39.0–52.0)
Hemoglobin: 14.4 g/dL (ref 13.0–17.0)
MCH: 31.1 pg (ref 26.0–34.0)
MCHC: 33.6 g/dL (ref 30.0–36.0)
MCV: 92.4 fL (ref 80.0–100.0)
Platelets: 164 10*3/uL (ref 150–400)
RBC: 4.63 MIL/uL (ref 4.22–5.81)
RDW: 12 % (ref 11.5–15.5)
WBC: 7 10*3/uL (ref 4.0–10.5)
nRBC: 0 % (ref 0.0–0.2)

## 2021-04-08 SURGERY — OPEN REDUCTION INTERNAL FIXATION (ORIF) DISTAL RADIUS FRACTURE
Anesthesia: Regional | Laterality: Right

## 2021-04-08 MED ORDER — LACTATED RINGERS IV SOLN: INTRAVENOUS | Status: DC

## 2021-04-08 MED ORDER — ACETAMINOPHEN 500 MG PO TABS
1000.0000 mg | ORAL_TABLET | Freq: Once | ORAL | Status: AC
  Administered 2021-04-08: 1000 mg via ORAL
  Filled 2021-04-08: qty 2

## 2021-04-08 MED ORDER — ONDANSETRON HCL 4 MG/2ML IJ SOLN
INTRAMUSCULAR | Status: DC | PRN
  Administered 2021-04-08: 4 mg via INTRAVENOUS

## 2021-04-08 MED ORDER — MIDAZOLAM HCL 2 MG/2ML IJ SOLN
INTRAMUSCULAR | Status: AC
  Filled 2021-04-08: qty 2

## 2021-04-08 MED ORDER — CHLORHEXIDINE GLUCONATE 0.12 % MT SOLN
15.0000 mL | Freq: Once | OROMUCOSAL | Status: AC
  Administered 2021-04-08: 15 mL via OROMUCOSAL
  Filled 2021-04-08: qty 15

## 2021-04-08 MED ORDER — CEFAZOLIN SODIUM-DEXTROSE 2-4 GM/100ML-% IV SOLN
2.0000 g | INTRAVENOUS | Status: DC
  Filled 2021-04-08: qty 100

## 2021-04-08 MED ORDER — FENTANYL CITRATE (PF) 100 MCG/2ML IJ SOLN
INTRAMUSCULAR | Status: AC
  Administered 2021-04-08: 100 ug via INTRAVENOUS
  Filled 2021-04-08: qty 2

## 2021-04-08 MED ORDER — DEXAMETHASONE SODIUM PHOSPHATE 10 MG/ML IJ SOLN
INTRAMUSCULAR | Status: DC | PRN
Start: 2021-04-08 — End: 2021-04-08
  Administered 2021-04-08: 10 mg via INTRAVENOUS

## 2021-04-08 MED ORDER — ORAL CARE MOUTH RINSE: 15.0000 mL | Freq: Once | OROMUCOSAL | Status: AC

## 2021-04-08 MED ORDER — AMISULPRIDE (ANTIEMETIC) 5 MG/2ML IV SOLN: 10.0000 mg | Freq: Once | INTRAVENOUS | Status: DC | PRN

## 2021-04-08 MED ORDER — FENTANYL CITRATE (PF) 250 MCG/5ML IJ SOLN
INTRAMUSCULAR | Status: DC | PRN
  Administered 2021-04-08 (×2): 50 ug via INTRAVENOUS

## 2021-04-08 MED ORDER — PROPOFOL 1000 MG/100ML IV EMUL
INTRAVENOUS | Status: AC
  Filled 2021-04-08: qty 100

## 2021-04-08 MED ORDER — MIDAZOLAM HCL 2 MG/2ML IJ SOLN: 2.0000 mg | Freq: Once | INTRAMUSCULAR | Status: DC

## 2021-04-08 MED ORDER — PROPOFOL 500 MG/50ML IV EMUL
INTRAVENOUS | Status: DC | PRN
  Administered 2021-04-08: 100 ug/kg/min via INTRAVENOUS

## 2021-04-08 MED ORDER — ROPIVACAINE HCL 5 MG/ML IJ SOLN
INTRAMUSCULAR | Status: DC | PRN
  Administered 2021-04-08: 30 mL via PERINEURAL

## 2021-04-08 MED ORDER — 0.9 % SODIUM CHLORIDE (POUR BTL) OPTIME
TOPICAL | Status: DC | PRN
  Administered 2021-04-08: 1000 mL

## 2021-04-08 MED ORDER — GABAPENTIN 300 MG PO CAPS
300.0000 mg | ORAL_CAPSULE | Freq: Once | ORAL | Status: AC
  Administered 2021-04-08: 300 mg via ORAL
  Filled 2021-04-08: qty 1

## 2021-04-08 MED ORDER — FENTANYL CITRATE (PF) 100 MCG/2ML IJ SOLN: 25.0000 ug | INTRAMUSCULAR | Status: DC | PRN

## 2021-04-08 MED ORDER — MIDAZOLAM HCL 2 MG/2ML IJ SOLN
INTRAMUSCULAR | Status: DC | PRN
Start: 2021-04-08 — End: 2021-04-08
  Administered 2021-04-08 (×2): 1 mg via INTRAVENOUS

## 2021-04-08 MED ORDER — CLONIDINE HCL (ANALGESIA) 100 MCG/ML EP SOLN
EPIDURAL | Status: DC | PRN
Start: 2021-04-08 — End: 2021-04-08
  Administered 2021-04-08: 50 ug

## 2021-04-08 MED ORDER — OXYCODONE HCL 5 MG PO TABS
5.0000 mg | ORAL_TABLET | ORAL | 0 refills | Status: AC | PRN
Start: 2021-04-08 — End: 2021-04-15

## 2021-04-08 MED ORDER — FENTANYL CITRATE (PF) 100 MCG/2ML IJ SOLN: 100.0000 ug | Freq: Once | INTRAMUSCULAR | Status: AC

## 2021-04-08 MED ORDER — CELECOXIB 200 MG PO CAPS
200.0000 mg | ORAL_CAPSULE | Freq: Once | ORAL | Status: AC
  Administered 2021-04-08: 200 mg via ORAL
  Filled 2021-04-08: qty 1

## 2021-04-08 MED ORDER — ONDANSETRON HCL 4 MG/2ML IJ SOLN
INTRAMUSCULAR | Status: AC
  Filled 2021-04-08: qty 2

## 2021-04-08 MED ORDER — MIDAZOLAM HCL 2 MG/2ML IJ SOLN
INTRAMUSCULAR | Status: AC
  Administered 2021-04-08: 2 mg
  Filled 2021-04-08: qty 2

## 2021-04-08 MED ORDER — FENTANYL CITRATE (PF) 250 MCG/5ML IJ SOLN
INTRAMUSCULAR | Status: AC
  Filled 2021-04-08: qty 5

## 2021-04-08 MED ORDER — DEXAMETHASONE SODIUM PHOSPHATE 10 MG/ML IJ SOLN
INTRAMUSCULAR | Status: AC
  Filled 2021-04-08: qty 1

## 2021-04-08 SURGICAL SUPPLY — 49 items
BAG COUNTER SPONGE SURGICOUNT (BAG) ×2 IMPLANT
BIT DRILL DIS RAD GEMNS 2.3X40 (DRILL) IMPLANT
BLADE CLIPPER SURG (BLADE) IMPLANT
BNDG ELASTIC 3X5.8 VLCR STR LF (GAUZE/BANDAGES/DRESSINGS) ×2 IMPLANT
BNDG ELASTIC 4X5.8 VLCR STR LF (GAUZE/BANDAGES/DRESSINGS) ×1 IMPLANT
BNDG ESMARK 4X9 LF (GAUZE/BANDAGES/DRESSINGS) ×2 IMPLANT
BNDG GAUZE ELAST 4 BULKY (GAUZE/BANDAGES/DRESSINGS) ×1 IMPLANT
CANISTER SUCT 3000ML PPV (MISCELLANEOUS) ×2 IMPLANT
CORD BIPOLAR FORCEPS 12FT (ELECTRODE) ×2 IMPLANT
COVER SURGICAL LIGHT HANDLE (MISCELLANEOUS) ×2 IMPLANT
CUFF TOURN SGL QUICK 18X4 (TOURNIQUET CUFF) ×2 IMPLANT
DRAPE SURG 17X11 SM STRL (DRAPES) ×2 IMPLANT
DRILL DIS RAD GEMINUS 2.3X40 (DRILL) ×2
GAUZE 4X4 16PLY ~~LOC~~+RFID DBL (SPONGE) ×2 IMPLANT
GAUZE XEROFORM 1X8 LF (GAUZE/BANDAGES/DRESSINGS) ×1 IMPLANT
GLOVE SURG ORTHO LTX SZ8 (GLOVE) ×2 IMPLANT
GLOVE SURG UNDER POLY LF SZ8.5 (GLOVE) ×2 IMPLANT
GOWN STRL REUS W/ TWL LRG LVL3 (GOWN DISPOSABLE) ×1 IMPLANT
GOWN STRL REUS W/ TWL XL LVL3 (GOWN DISPOSABLE) ×1 IMPLANT
GOWN STRL REUS W/TWL LRG LVL3 (GOWN DISPOSABLE) ×1
GOWN STRL REUS W/TWL XL LVL3 (GOWN DISPOSABLE) ×1
KIT BASIN OR (CUSTOM PROCEDURE TRAY) ×2 IMPLANT
KIT TURNOVER KIT B (KITS) ×2 IMPLANT
NDL HYPO 25X1 1.5 SAFETY (NEEDLE) ×1 IMPLANT
NEEDLE HYPO 25X1 1.5 SAFETY (NEEDLE) ×2 IMPLANT
NS IRRIG 1000ML POUR BTL (IV SOLUTION) ×2 IMPLANT
PACK ORTHO EXTREMITY (CUSTOM PROCEDURE TRAY) ×2 IMPLANT
PAD ARMBOARD 7.5X6 YLW CONV (MISCELLANEOUS) ×4 IMPLANT
PAD CAST 3X4 CTTN HI CHSV (CAST SUPPLIES) IMPLANT
PAD CAST 4YDX4 CTTN HI CHSV (CAST SUPPLIES) IMPLANT
PADDING CAST COTTON 3X4 STRL (CAST SUPPLIES) ×1
PADDING CAST COTTON 4X4 STRL (CAST SUPPLIES) ×1
PLATE SHRT TI SPANN DORSAL 160 (Plate) ×1 IMPLANT
SCREW COMP MULTI THRD 3.0X10 (Screw) ×2 IMPLANT
SCREW COMP MULTI THRD 3.0X12 (Screw) ×2 IMPLANT
SCREW LOCK MULTI THRD 3.0X10 (Screw) ×1 IMPLANT
SCREW LOCK MULTI THRD 3.0X12 (Screw) ×4 IMPLANT
SPLINT FIBERGLASS 4X30 (CAST SUPPLIES) ×1 IMPLANT
SPONGE T-LAP 4X18 ~~LOC~~+RFID (SPONGE) ×2 IMPLANT
SUT ETHILON 2 0 FS 18 (SUTURE) ×1 IMPLANT
SUT ETHILON 3 0 PS 1 (SUTURE) ×1 IMPLANT
SUT ETHILON 4 0 FS 1 (SUTURE) ×1 IMPLANT
SUT MNCRL AB 3-0 PS2 18 (SUTURE) ×1 IMPLANT
SYR CONTROL 10ML LL (SYRINGE) IMPLANT
TOWEL GREEN STERILE (TOWEL DISPOSABLE) ×2 IMPLANT
TOWEL GREEN STERILE FF (TOWEL DISPOSABLE) IMPLANT
TRAP DIGIT (INSTRUMENTS) ×1 IMPLANT
TUBE CONNECTING 12X1/4 (SUCTIONS) ×2 IMPLANT
YANKAUER SUCT BULB TIP NO VENT (SUCTIONS) IMPLANT

## 2021-04-08 NOTE — Brief Op Note (Signed)
04/08/2021  5:25 PM  PATIENT:  [name]  61 y.o. male  PRE-OPERATIVE DIAGNOSIS:  Right distal radius fracture  POST-OPERATIVE DIAGNOSIS:  Right distal radius fracture  PROCEDURE:  Procedure(s): Right OPEN REDUCTION INTERNAL FIXATION (ORIF) DISTAL RADIAL FRACTURE (Right)  SURGEON:  Surgeon(s) and Role:    * [name], MD - Primary  PHYSICIAN ASSISTANT:   ASSISTANTS: none   ANESTHESIA:   regional and MAC  EBL:  15 mL   BLOOD ADMINISTERED:none  DRAINS: none   LOCAL MEDICATIONS USED:  NONE  SPECIMEN:  No Specimen  DISPOSITION OF SPECIMEN:  N/A  COUNTS:  YES  TOURNIQUET:   Total Tourniquet Time Documented: Upper Arm (Right) - 110 minutes Total: Upper Arm (Right) - 110 minutes   DICTATION: .Viviann Spare Dictation  PLAN OF CARE: Discharge to home after PACU  PATIENT DISPOSITION:  PACU - hemodynamically stable.   Delay start of Pharmacological VTE agent (>24hrs) due to surgical blood loss or risk of bleeding: not applicable

## 2021-04-08 NOTE — Transfer of Care (Signed)
Immediate Anesthesia Transfer of Care Note  Patient: Stephen [name].  Procedure(s) Performed: Right OPEN REDUCTION INTERNAL FIXATION (ORIF) DISTAL RADIAL FRACTURE (Right)  Patient Location: PACU  Anesthesia Type:MAC combined with regional for post-op pain  Level of Consciousness: awake, alert  and oriented  Airway & Oxygen Therapy: Patient Spontanous Breathing  Post-op Assessment: Report given to RN and Post -op Vital signs reviewed and stable  Post vital signs: Reviewed and stable  Last Vitals:  Vitals Value Taken Time  BP 148/90 04/08/21 1739  Temp    Pulse 77 04/08/21 1741  Resp 17 04/08/21 1741  SpO2 92 % 04/08/21 1741  Vitals shown include unvalidated device data.  Last Pain:  Vitals:   04/08/21 1257  TempSrc:   PainSc: 0-No pain         Complications: No notable events documented.

## 2021-04-08 NOTE — Anesthesia Preprocedure Evaluation (Addendum)
Anesthesia Evaluation  Patient identified by MRN, date of birth, ID band Patient awake    Reviewed: Allergy & Precautions, NPO status , Patient's Chart, lab work & pertinent test results  Airway Mallampati: II  TM Distance: >3 FB Neck ROM: Full    Dental  (+) Dental Advisory Given   Pulmonary neg pulmonary ROS,    breath sounds clear to auscultation       Cardiovascular hypertension, Pt. on medications  Rhythm:Regular Rate:Normal     Neuro/Psych  Neuromuscular disease    GI/Hepatic negative GI ROS, Neg liver ROS,   Endo/Other  negative endocrine ROS  Renal/GU      Musculoskeletal   Abdominal   Peds  Hematology negative hematology ROS (+)   Anesthesia Other Findings   Reproductive/Obstetrics                             Anesthesia Physical Anesthesia Plan  ASA: 2  Anesthesia Plan: Regional   Post-op Pain Management: Regional block, Tylenol PO (pre-op) and Minimal or no pain anticipated   Induction:   PONV Risk Score and Plan: 1 and Propofol infusion, Ondansetron and Treatment may vary due to age or medical condition  Airway Management Planned: Natural Airway and Simple Face Mask  Additional Equipment: None  Intra-op Plan:   Post-operative Plan:   Informed Consent: I have reviewed the patients History and Physical, chart, labs and discussed the procedure including the risks, benefits and alternatives for the proposed anesthesia with the patient or authorized representative who has indicated his/her understanding and acceptance.       Plan Discussed with: CRNA  Anesthesia Plan Comments: (Plan routine monitors, ax block, MAC. GA with LMA as backup.)       Anesthesia Quick Evaluation

## 2021-04-08 NOTE — Discharge Instructions (Signed)
Audria Nine, M.D. Hand Surgery  POST-OPERATIVE DISCHARGE INSTRUCTIONS   PRESCRIPTIONS: You have been given a prescription to be taken as directed for post-operative pain control.  You may also take over the counter ibuprofen/aleve and tylenol for pain. Take this as directed on the packaging. Do not exceed 3000 mg tylenol/acetaminophen in 24 hours.  Ibuprofen 600-800 mg (3-4) tablets by mouth every 6 hours as needed for pain.  OR Aleve 2 tablets by mouth every 12 hours (twice daily) as needed for pain.  AND/OR Tylenol 1000 mg (2 tablets) every 8 hours as needed for pain.  Please use your pain medication carefully, as refills are limited and you may not be provided with one.  As stated above, please use over the counter pain medicine - it will also be helpful with decreasing your swelling.    ANESTHESIA: After your surgery, post-surgical discomfort or pain is likely. This discomfort can last several days to a few weeks. At certain times of the day your discomfort may be more intense.   Did you receive a nerve block?  A nerve block can provide pain relief for one hour to two days after your surgery. As long as the nerve block is working, you will experience little or no sensation in the area the surgeon operated on.  As the nerve block wears off, you will begin to experience pain or discomfort. It is very important that you begin taking your prescribed pain medication before the nerve block fully wears off. Treating your pain at the first sign of the block wearing off will ensure your pain is better controlled and more tolerable when full-sensation returns. Do not wait until the pain is intolerable, as the medicine will be less effective. It is better to treat pain in advance than to try and catch up.   General Anesthesia:  If you did not receive a nerve block during your surgery, you will need to start taking your pain medication shortly after your surgery and should continue to do  so as prescribed by your surgeon.     ICE AND ELEVATION: You may use ice for the first 48-72 hours, but it is not critical.   Elevation, as much as possible for the next 48 hours, is critical for decreasing swelling as well as for pain relief. Elevation means when you are seated or lying down, you hand should be at or above your heart. When walking, the hand needs to be at or above the level of your elbow.  If the bandage gets too tight, it may need to be loosened. Please contact our office and we will instruct you in how to do this.    SURGICAL BANDAGES:  Keep your dressing and/or splint clean and dry at all times.  Do not remove until you are seen again in the office.  If careful, you may place a plastic bag over your bandage and tape the end to shower, but be careful, do not get your bandages wet.     HAND THERAPY:  You may not need any. If you do, we will begin this at your follow up visit in the clinic.    ACTIVITY AND WORK: You are encouraged to move any fingers which are not in the bandage.  Light use of the fingers is allowed to assist the other hand with daily hygiene and eating, but strong gripping or lifting is often uncomfortable and should be avoided.  You might miss a variable period of time from  work and hopefully this issue has been discussed prior to surgery. You may not do any heavy work with your affected hand for about 2 weeks.    [facility] [address] [place],  [place]  [address] [phone]

## 2021-04-08 NOTE — Interval H&P Note (Signed)
History and Physical Interval Note:  04/08/2021 2:05 PM  Stephen [name].  has presented today for surgery, with the diagnosis of Right distal radius fracture.  The various methods of treatment have been discussed with the patient and family. After consideration of risks, benefits and other options for treatment, the patient has consented to  Procedure(s): Right OPEN REDUCTION INTERNAL FIXATION (ORIF) DISTAL RADIAL FRACTURE (Right) as a surgical intervention.  The patient's history has been reviewed, patient examined, no change in status, stable for surgery.  I have reviewed the patient's chart and labs.  Questions were answered to the patient's satisfaction.     Ashish Rossetti Luismanuel Corman

## 2021-04-08 NOTE — Anesthesia Procedure Notes (Signed)
Anesthesia Regional Block: Axillary brachial plexus block   Pre-Anesthetic Checklist: , timeout performed,  Correct Patient, Correct Site, Correct Laterality,  Correct Procedure, Correct Position, site marked,  Risks and benefits discussed,  Surgical consent,  Pre-op evaluation,  At surgeon's request and post-op pain management  Laterality: Right  Prep: chloraprep       Needles:  Injection technique: Single-shot  Needle Type: Echogenic Stimulator Needle     Needle Length: 9cm  Needle Gauge: 21     Additional Needles:   Procedures:, nerve stimulator,,, ultrasound used (permanent image in chart),,     Nerve Stimulator or Paresthesia:  Response: MC, radial, median and ulnar responses elicited, 0.5 mA  Additional Responses:   Narrative:  Start time: 04/08/2021 1:37 PM End time: 04/08/2021 1:42 PM Injection made incrementally with aspirations every 5 mL.  Performed by: Personally  Anesthesiologist: Suzette Battiest, MD

## 2021-04-08 NOTE — Anesthesia Postprocedure Evaluation (Signed)
Anesthesia Post Note  Patient: Stephen [name].  Procedure(s) Performed: Right OPEN REDUCTION INTERNAL FIXATION (ORIF) DISTAL RADIAL FRACTURE (Right)     Patient location during evaluation: PACU Anesthesia Type: Regional Level of consciousness: awake and alert, patient cooperative and oriented Pain management: pain level controlled Vital Signs Assessment: post-procedure vital signs reviewed and stable Respiratory status: spontaneous breathing, nonlabored ventilation and respiratory function stable Cardiovascular status: blood pressure returned to baseline and stable Postop Assessment: no apparent nausea or vomiting Anesthetic complications: no   No notable events documented.  Last Vitals:  Vitals:   04/08/21 1739 04/08/21 1754  BP: (!) 148/90 (!) 156/90  Pulse: 81 72  Resp: 12 15  Temp: 36.6 C   SpO2: 100% 96%    Last Pain:  Vitals:   04/08/21 1739  TempSrc:   PainSc: 0-No pain                 Assunta Pupo,E. Zaray Gatchel

## 2021-04-09 NOTE — Op Note (Signed)
Date of Surgery: 04/09/2021  INDICATIONS: Mr. Narine is a 61 y.o.-year-old male with right distal radius fracture after falling from a step stool in his garage over the weekend.  Risks, benefits, and alternatives to surgery were again discussed with the patient wishing to proceed with surgery.  Informed consent was signed after our discussion.   PREOPERATIVE DIAGNOSIS: 1. Closed right distal radius fracture, intra-articular  POSTOPERATIVE DIAGNOSIS: Same.  PROCEDURE: 1. ORIF right distal radius fracture, 3+ fragments   SURGEON: Audria Nine, M.D.  ASSIST:   ANESTHESIA:  Regional, MAC  IV FLUIDS AND URINE: See anesthesia.  ESTIMATED BLOOD LOSS: 20 mL.  IMPLANTS:  Implant Name Type Inv. Item Serial No. Manufacturer Lot No. LRB No. Used Action  DORSAL SPANNIG PLATE 160M    SKELET[employer]  Right 1 Implanted  3.0 NON LOCKING SCRE 10MM    SKELETAL [employer]  Right 2 Implanted  3.0 locking screw 27m    SKELETAL [employer]  Right 1 Implanted  3.0 NON LOCKING SCREW 12MM Screw   SKELETAL [employer]YNAM[employer]  Right 2 Implanted  3.0 LOXKING SCREW 12MM Screw   SKELETAL [employer]YNAM[employer]  Right 4 Implanted     DRAINS: None  COMPLICATIONS: see description of procedure.  DESCRIPTION OF PROCEDURE: The patient was met in the preoperative holding area where the surgical site was marked and the consent form was verified.  The patient was then taken to the operating room and transferred to the operating table.  All bony prominences were well padded.  A tourniquet was applied to the right upper arm.  The operative extremity was prepped and draped in the usual and sterile fashion.  A formal time-out was performed to confirm that this was the correct patient, surgery, side, and site.   Following formal timeout, the limb was exsanguinated with an Esmarch bandage and the tourniquet inflated to 2 and 50 mmHg.  Examination of the wrist under fluoroscopy was notable for a intra-articular distal radius fracture with  severe dorsal comminution.  I felt that the comminution was so extensive that a volar locking plate would have difficulty maintaining fracture reduction.  The decision was then made to apply a dorsal spanning plate.  The plate was templated on the skin in line with the second metacarpal.  An incision was made over both the index metacarpal and the mid aspect of the forearm.   I started at the index metacarpal.  Small crossing vessels were coagulated as needed with bipolar electrocautery.  The tendons were retracted and the periosteum over the index metacarpal was incised.  I did not turn my attention to the mid aspect of the forearm where the proximal to the nail would sit.  The skin subcutaneous tissue was divided.  Again small crossing vessels were coagulated as needed.  The dorsal superficial fascia was incised.  Blunt dissection was used to identify the interval underneath the radial wrist extensors.  The shorter of the 2 plate options was chosen.  It was then advanced from distal to proximal.  Care was taken that the tendon was sitting underneath the radial wrist extensors and the EPL tendon.  The plate was applied to the next metacarpal with a combination of locking and nonlocking screws.  Traction was applied to the arm and the plate was clamped at the radial shaft.  AP and lateral views showed appropriate reduction.  The radial height was restored on the PA view as well as the radial inclination.  The plate was applied slightly rotated on the  index metacarpal such that I did not sit directly posterior on the radius but rather sat slightly to the radial side.  As a consequence, the true lateral view of the wrist did not have the plate sitting directly posterior but rather off to the side.  The lateral view showed restoration of the volar tilt with neutral to slight volar tilt rather than the previous dorsal angulation preoperatively.  Happy with the reduction the proximal aspect of the plate was filled with a  combination of locking nonlocking screws.  An incision was made over the midportion of the plate.  The extensor retinaculum was divided and a small portion to again confirm that the plate was sitting underneath the EPL and radial wrist extensors.  The wound was then thoroughly irrigated both proximally and distally.  It was closed in layered fashion using 3-0 Monocryl suture followed by 4-0 nylon suture in horizontal mattress fashion.  The patient had full pronation and supination with the plate applied.  The wound was then dressed with Xeroform, folded Kerlix, cast padding, and a well-padded volar splint.  The patient was then reversed from sedation and transferred to the postoperative bed.  All counts were correct observed in the procedure.  Patient was then taken the PACU in stable condition.  POSTOPERATIVE PLAN: I will see him back in 2 weeks for suture removal.  We will then start her in therapy to work on range of motion of the fingers to prevent stiffness.  He will be discharged home tonight with appropriate pain medication and discharge instructions.  Audria Nine, MD 11:20 AM

## 2021-04-13 ENCOUNTER — Encounter (HOSPITAL_COMMUNITY): Payer: Self-pay | Admitting: Orthopedic Surgery

## 2021-04-20 ENCOUNTER — Other Ambulatory Visit: Payer: Self-pay

## 2021-04-20 ENCOUNTER — Ambulatory Visit: Payer: 59

## 2021-04-20 ENCOUNTER — Ambulatory Visit (INDEPENDENT_AMBULATORY_CARE_PROVIDER_SITE_OTHER): Payer: 59 | Admitting: Orthopedic Surgery

## 2021-04-20 DIAGNOSIS — S52571A Other intraarticular fracture of lower end of right radius, initial encounter for closed fracture: Secondary | ICD-10-CM

## 2021-04-20 NOTE — Progress Notes (Signed)
Post-Op Visit Note   Patient: Stephen [name].           Date of Birth: [birth year]           MRN: [id number] Visit Date: 04/20/2021 PCP: [name], MD   Assessment & Plan:  Chief Complaint:  Chief Complaint  Patient presents with   Right Wrist - Routine Post Op, Fracture   Visit Diagnoses:  1. Other closed intra-articular fracture of distal end of right radius, initial encounter     Plan: Patient is now 2 weeks out from bridge plating of his right distal radius fracture.  X-rays taken today demonstrate appropriate alignment of the fracture in both AP and lateral views.  His biggest complaint is that he's developed tingling at the tips of his thumb index and ring finger with numbness of the middle finger.  This has been new since his surgery.  He has some stiffness of his finger secondary to decreased use.  His incisions are clean, dry, and intact.  His nylon sutures were removed.  I will send him for an EMG/nerve conduction study today to evaluate for carpal tunnel syndrome.  I will also send him to therapy to work on finger range of motion and edema control.  I can see him back in 2 weeks  Follow-Up Instructions: No follow-ups on file.   Orders:  Orders Placed This Encounter  Procedures   XR Wrist Complete Right   No orders of the defined types were placed in this encounter.   Imaging: No results found.  PMFS History: Patient Active Problem List   Diagnosis Date Noted   Closed fracture of right distal radius 04/07/2021   Heme positive stool 08/06/2020   Hordeolum externum of right upper eyelid 08/06/2020   Right hand pain 11/15/2017   Dermatitis due to drug reaction 09/02/2017   Drug-induced erythroderma 09/02/2017   Laryngitis, acute 08/04/2017   Nonallopathic lesion of cervical region 04/06/2017   Nonallopathic lesion of thoracic region 04/06/2017   Nonallopathic lesion of lumbosacral region 04/06/2017   Cervical radiculopathy at C8 03/04/2017    Excessive cerumen in both ear canals 02/15/2017   Neuropathy, cervical plexus 02/15/2017   Poison ivy dermatitis 07/29/2015   Pharyngitis 09/26/2013   Back strain 06/11/2013   Healthcare maintenance 04/25/2011   Mixed hyperlipidemia 03/14/2010   Essential hypertension 03/14/2010   Past Medical History:  Diagnosis Date   Colon polyps    Detached retina, right 22   Dr. [name] at [facility] did orbital band procedure with good results.    High cholesterol    Hypertension    Poison ivy dermatitis 07/29/2015    Family History  Problem Relation Age of Onset   Parkinsonism Mother    Colon polyps Father    Colon cancer Father 5   Cancer Father        colon cancer with mets   Hyperlipidemia Father    Hypertension Father    Hypertension Brother    Diabetes Neg Hx    Heart disease Neg Hx    Esophageal cancer Neg Hx    Rectal cancer Neg Hx    Stomach cancer Neg Hx     Past Surgical History:  Procedure Laterality Date   COLONOSCOPY  04/2007   Dr.Magod   COLONOSCOPY  09/06/2014   Dr.Magod   OPEN REDUCTION INTERNAL FIXATION (ORIF) DISTAL RADIAL FRACTURE Right 04/08/2021   Procedure: Right OPEN REDUCTION INTERNAL FIXATION (ORIF) DISTAL RADIAL FRACTURE;  Surgeon: [name], MD;  Location: [facility];  Service: Orthopedics;  Laterality: Right;   RETINAL DETACHMENT SURGERY     30 years ago   Social History   Occupational History   Occupation: busines    Employer: [employer]  Tobacco Use   Smoking status: Never   Smokeless tobacco: Never  Vaping Use   Vaping Use: Never used  Substance and Sexual Activity   Alcohol use: Not Currently    Comment: occ beer   Drug use: No   Sexual activity: Yes    Partners: Female

## 2021-04-22 ENCOUNTER — Encounter: Payer: Self-pay | Admitting: Family Medicine

## 2021-04-22 ENCOUNTER — Ambulatory Visit: Payer: 59 | Admitting: Occupational Therapy

## 2021-04-22 ENCOUNTER — Other Ambulatory Visit: Payer: Self-pay

## 2021-04-22 ENCOUNTER — Encounter: Payer: Self-pay | Admitting: Occupational Therapy

## 2021-04-22 DIAGNOSIS — M25531 Pain in right wrist: Secondary | ICD-10-CM

## 2021-04-22 DIAGNOSIS — R601 Generalized edema: Secondary | ICD-10-CM

## 2021-04-22 DIAGNOSIS — M6281 Muscle weakness (generalized): Secondary | ICD-10-CM

## 2021-04-22 DIAGNOSIS — M25641 Stiffness of right hand, not elsewhere classified: Secondary | ICD-10-CM

## 2021-04-22 DIAGNOSIS — R278 Other lack of coordination: Secondary | ICD-10-CM

## 2021-04-22 NOTE — Patient Instructions (Addendum)
WEARING SCHEDULE:  Wear splint at ALL times except for hygiene care. Wrap your hand/arm to take a shower, make sure that your hand stays dry.  PURPOSE:  To prevent movement and for protection until injury can heal  CARE OF SPLINT:  Keep splint away from heat sources including: stove, radiator or furnace, or a car in sunlight. The splint can melt and will no longer fit you properly  Keep away from pets and children  Clean the splint with rubbing alcohol as needed.  * During this time, make sure you also clean your hand/arm as instructed by your therapist and/or perform dressing changes as needed. Then dry hand/arm completely before replacing splint. (When cleaning hand/arm, keep it immobilized in same position until splint is replaced)  PRECAUTIONS/POTENTIAL PROBLEMS: *If you notice or experience increased pain, swelling, numbness, or a lingering reddened area from the splint: Contact your therapist immediately by calling [phone]. You must wear the splint for protection, but we will get you scheduled for adjustments as quickly as possible.  (If only straps or hooks need to be replaced and NO adjustments to the splint need to be made, just call the office ahead and let them know you are coming in)  If you have any medical concerns or signs of infection, please call your doctor immediately   Flexion (Active)    Raise arm to point to ceiling, keeping elbow straight. Hold ____ seconds. Repeat ____ times. Do ____ sessions per day. Activity: Use this motion to reach for items on overhead shelves.  External Rotation (Eccentric), Active - Supine    Do this exercise in sitting, move you right arm out from side, elbow at 90, forearm forward. Lift forearm of affected arm to neutral hold for 3-5 seconds and then slowly lower for 3-5 seconds. __5-10_ reps per set, __2-3_times per day.  Flexion (Active)    Palm up, rest elbow on other hand. Bend as far as possible. Then turn your palm  down and straighten your elbow. Hold _3-5_ seconds. Repeat _5-10__ times. Do __2-3__ sessions per day.  Scar Tissue Massage    Place pad of fingertip on scar area. Lightly while move your finger in a circular fashion, move back and forth and side to side across your scars.  Massage for 5-10 min 2-3x/day  Flexor Tendon Gliding (Active Full Fist)    Straighten all fingers, then make a fist, bending all joints. Repeat ____ times. Do ____ sessions per day.  Flexor Tendon Gliding (Active Hook Fist)    With fingers and knuckles straight, bend middle and tip joints. Do not bend large knuckles. Repeat ____ times. Do ____ sessions per day.  Flexor Tendon Gliding (Active Straight Fist)    Start with fingers straight. Bend knuckles and middle joints. Keep fingertip joints straight to touch base of palm. Repeat ____ times. Do ____ sessions per day.  Opposition (Active)    Touch tip of thumb to nail tip of each finger in turn, making an "O" shape. Repeat ____ times. Do ____ sessions per day.  Copyright  VHI. All rights reserved.

## 2021-04-22 NOTE — Therapy (Signed)
OUTPATIENT OCCUPATIONAL THERAPY ORTHO EVALUATION  Patient Name: Stephen [name]. MRN: [id number] DOB:08-Aug-1960, 61 y.o., male 23 Date: 04/22/2021  PCP: [name], MD REFERRING PROVIDER: [name], MD   OT End of Session - 04/22/21 1348     Visit Number 1    Number of Visits 6    Date for OT Re-Evaluation 05/20/21    Authorization Type [employer]    Progress Note Due on Visit 10   Every 10 th visit   OT Start Time 1350    OT Stop Time 1520    OT Time Calculation (min) 90 min    Activity Tolerance Patient tolerated treatment well    Behavior During Therapy [facility] for tasks assessed/performed             Past Medical History:  Diagnosis Date   Colon polyps    Detached retina, right 40   Dr. [name] at [facility] did orbital band procedure with good results.    High cholesterol    Hypertension    Poison ivy dermatitis 07/29/2015   Past Surgical History:  Procedure Laterality Date   COLONOSCOPY  04/2007   Dr.Magod   COLONOSCOPY  09/06/2014   Dr.Magod   OPEN REDUCTION INTERNAL FIXATION (ORIF) DISTAL RADIAL FRACTURE Right 04/08/2021   Procedure: Right OPEN REDUCTION INTERNAL FIXATION (ORIF) DISTAL RADIAL FRACTURE;  Surgeon: [name], MD;  Location: [facility];  Service: Orthopedics;  Laterality: Right;   RETINAL DETACHMENT SURGERY     30 years ago   Patient Active Problem List   Diagnosis Date Noted   Closed fracture of right distal radius 04/07/2021   Heme positive stool 08/06/2020   Hordeolum externum of right upper eyelid 08/06/2020   Right hand pain 11/15/2017   Dermatitis due to drug reaction 09/02/2017   Drug-induced erythroderma 09/02/2017   Laryngitis, acute 08/04/2017   Nonallopathic lesion of cervical region 04/06/2017   Nonallopathic lesion of thoracic region 04/06/2017   Nonallopathic lesion of lumbosacral region 04/06/2017   Cervical radiculopathy at C8 03/04/2017   Excessive cerumen in both ear canals 02/15/2017    Neuropathy, cervical plexus 02/15/2017   Poison ivy dermatitis 07/29/2015   Pharyngitis 09/26/2013   Back strain 06/11/2013   Healthcare maintenance 04/25/2011   Mixed hyperlipidemia 03/14/2010   Essential hypertension 03/14/2010   ONSET DATE: 04/09/2021  REFERRING DIAG: Other closed intra-articular fracture of distal end of right radius, initial encounter   THERAPY DIAG:  Right distal radius bridge plating two weeks ago for fracture.  Edema control and finger ROM   SUBJECTIVE: DOI: 04/06/2021    Date of Surgery: 04/09/2021  [name] is a 61 y.o.-year-old male with right distal radius fracture after falling from a step stool in his garage about 2 weeks ago. He had bridge plating of his right distal radius fracture on 04/09/21. His biggest complaint is that he's developed tingling at the tips of his thumb index and ring finger with numbness of the middle finger. This has been new since his surgery per his report. He later stated that he had been being seen for shoulder/neck pain and CTS by Dr Tamala Julian at [facility] "For several years". His incisions are clean, dry, and intact. His sutures were removed on 04/20/21. He is scheduled for EMG/nerve conduction study on 04/29/21 to evaluate for carpal tunnel syndrome.  He is referred to therapy to work on finger range of motion and edema control. He will f/u with Dr Tempie Donning in 2 weeks.  PERTINENT HISTORY: X-rays from 04/20/21 demonstrate appropriate alignment of the fracture in both AP and lateral views per Dr Tempie Donning notes and chart review.  Pt reports numbness in digits 2-4. He is scheduled for nerve conduction study on 04/29/21. 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  PAIN:  Are you having pain? No NPRS scale: 0/10 Pain location: N/A Pain orientation: Right  PAIN TYPE:  Pain  description:      Aggravating factors:  Relieving factors:   PRECAUTIONS: Other: NWB R UE  HAND DOMINANCE: Right  WEIGHT BEARING RESTRICTIONS Yes NWB R UE  FALLS: Has patient fallen in last 6 months? Yes, Number of falls: 1 resulting in R distal radius fx with ORIF.   PLOF: Independent  PATIENT GOALS "I want my hand back" right wrist.  OBJECTIVE:   DIAGNOSTIC FINDINGS: Pt reports edema in right wrist/hand and lack of A/ROM in R digits. Pt is unable to make a fist and can oppose to his IF only at this time. He presented in his post op cast. His sutures were removed 04/20/21.  COGNITION: Overall cognitive status: Within functional limits for tasks assessed  ADLs: Overall ADLs: Pt is getting PRN assist from wife and daughter for ADL's (lifting, carrying, opening etc...). 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  FUNCTIONAL OUTCOME MEASURES: Quick Dash Score    61.36 364 % disability  SENSATION: Light touch: Impaired:   Semmes Weinstein Monofilament scale:  Right hand thumb = 4.31, IF 4.31, MF = 4.56, small = 3.61  COORDINATION: 9 Hole Peg test: Right: NT sec; Left: NT sec Box and Blocks: Comments: NT  EDEMA: Moderate edema noted in right forearm, hand and digits.   MUSCLE TONE: WNL's   SKIN INTEGRITY: Pt had sutures removed on 04/20/21. Pt is w/ bruising along dorsal hand and forearm and healed suture sites, no drainage noted or open areas are noted.   PALPATION: N/A  UE AROM/PROM:  A/PROM Right 04/22/2021 Left 04/22/2021  Shoulder flexion Stiffness at end range NT  Shoulder abduction ""   Shoulder adduction WNL   Shoulder extension    Shoulder internal rotation Stiffness noted   Shoulder external rotation Stiffness noted   Elbow flexion WNL's   Elbow extension Stiffness at end range   Wrist flexion NT   Wrist extension    Wrist ulnar deviation    Wrist radial deviation    Wrist pronation    Wrist supination     (Blank rows = not tested)  HAND A/PROM:  A/PROM Right 04/22/2021 Left 04/22/2021  Thumb MCP (0-60) Stiffness, impaired only able to oppose to IF NT  Thumb IP (0-80) Impaired   Thumb Radial abd/add (0-55) ""   Thumb Palmar abd/add (0-45) ""   Thumb opposition to index    Index MCP (0-90) Moderated edema and finger stiffness throughout R hand/digits noted. Limited A/ROM   Index PIP (0-100)    Index DIP (0-70)    Long MCP (0-90)    Long PIP (0-100)    Long DIP (0-70)    Ring MCP (0-90_    Ring PIP (0-100)    Ring DIP (0-70)    Little MCP (0-90)    Little PIP (0-100)    Little DIP (0-70)    (Blank rows =  not tested)  UE MMT:  MMT Right 04/22/2021 Left 04/22/2021  Shoulder flexion NT NT  Shoulder abduction    Shoulder adduction    Shoulder extension    Shoulder internal rotation    Shoulder external rotation    Middle trapezius    Lower trapezius    Elbow flexion    Elbow extension    Wrist flexion    Wrist extension    Wrist ulnar deviation    Wrist radial deviation    Wrist pronation    Wrist supination    (Blank rows = not tested)  HAND FUNCTION:  Grip strength: Right: NT lbs; Left: NT lbs Lateral pinch: Right: NT lbs, Left: NT lbs 3 point pinch: Right: NT lbs, Left: NT lbs Comments: Deferred  TODAY'S TREATMENT:  1) Pt post-op dressing was removed in the clinic. Pt was educated in gentle dorsal wrist and forearm scars massage 2-3x/day for 5-10 min sessions.  2) Pt was then fitted with a protective custom fabricated forearm/wrist volar splint that places his right wrist in neutral position. Pt was educated in splinting use, care and precautions both written and verbally. Pt will wear the splint at all times and wrap to bathe until directed otherwise by Dr Tempie Donning. 3) Pt was educated in a HEP for active ROM right hand tendon gliding, opposition 4-6x/day, elbow flexion/extension, shoulder flexion, ABD with IR/ER. Pt was educated through Science writer.    PATIENT EDUCATION: Education details: HEP, splinting use, care and precautions. Person educated: Patient Education method: Explanation, Demonstration, Verbal cues, and Handouts Education comprehension: verbalized understanding, returned demonstration, and needs further education   HOME EXERCISE PROGRAM: WEARING SCHEDULE:  Wear splint at ALL times except for hygiene care. Wrap your hand/arm to take a shower, make sure that your hand stays dry.  PURPOSE:  To prevent movement and for protection until injury can heal  CARE OF SPLINT:  Keep splint away from heat sources including: stove, radiator or furnace, or a car in sunlight. The splint can melt and will no longer fit you properly  Keep away from pets and children  Clean the splint with rubbing alcohol as needed.  * During this time, make sure you also clean your hand/arm as instructed by your therapist and/or perform dressing changes as needed. Then dry hand/arm completely before replacing splint. (When cleaning hand/arm, keep it immobilized in same position until splint is replaced)  PRECAUTIONS/POTENTIAL PROBLEMS: *If you notice or experience increased pain, swelling, numbness, or a lingering reddened area from the splint: Contact your therapist immediately by calling [phone]. You must wear the splint for protection, but we will get you scheduled for adjustments as quickly as possible.  (If only straps or hooks need to be replaced and NO adjustments to the splint need to be made, just call the office ahead and let them know you are coming in)  If you have any medical concerns or signs of infection, please call your doctor immediately   Flexion (Active)    Raise arm to point to ceiling, keeping elbow straight. Hold ____ seconds. Repeat ____ times. Do ____ sessions per day. Activity: Use this motion to reach for items on overhead shelves.  External Rotation (Eccentric), Active - Supine    Do this  exercise in sitting, move you right arm out from side, elbow at 90, forearm forward. Lift forearm of affected arm to neutral hold for 3-5 seconds and then slowly lower for 3-5 seconds. __5-10_ reps per set, __2-3_times per day.  Flexion (Active)    Palm up, rest elbow on other hand. Bend as far as possible. Then turn your palm down and straighten your elbow. Hold _3-5_ seconds. Repeat _5-10__ times. Do __2-3__ sessions per day.  Scar Tissue Massage    Place pad of fingertip on scar area. Lightly while move your finger in a circular fashion, move back and forth and side to side across your scars.  Massage for 5-10 min 2-3x/day  Flexor Tendon Gliding (Active Full Fist)    Straighten all fingers, then make a fist, bending all joints. Repeat ____ times. Do ____ sessions per day.  Flexor Tendon Gliding (Active Hook Fist)    With fingers and knuckles straight, bend middle and tip joints. Do not bend large knuckles. Repeat ____ times. Do ____ sessions per day.  Flexor Tendon Gliding (Active Straight Fist)    Start with fingers straight. Bend knuckles and middle joints. Keep fingertip joints straight to touch base of palm. Repeat ____ times. Do ____ sessions per day.  Opposition (Active)    Touch tip of thumb to nail tip of each finger in turn, making an "O" shape. Repeat ____ times. Do ____ sessions per day.  Copyright  VHI. All rights reserved.   ASSESSMENT:  CLINICAL IMPRESSION: Patient is a 61 y.o. R HD male who was seen today for occupational therapy evaluation and treatment for right distal radius bridge plating two weeks ago s/p fracture. He was also referred for edema control and finger ROM . Patient has performance deficits in functional skills including ADLs, coordination, dexterity, edema, ROM, strength, pain, flexibility, FMC, decreased knowledge of precautions, and UE functional use. These impairments are limiting patient from ADLs, work, and leisure. Patient  may have co-morbidities  that affects occupational performance. Patient will benefit from skilled OT to address above impairments and improve overall function.  MODIFICATION OR ASSISTANCE TO COMPLETE EVALUATION: No modification of tasks or assist necessary to complete an evaluation.  OT OCCUPATIONAL PROFILE AND HISTORY: Problem focused assessment: Including review of records relating to presenting problem.  CLINICAL DECISION MAKING: LOW - limited treatment options, no task modification necessary  REHAB POTENTIAL: Good  EVALUATION COMPLEXITY: Low     GOALS: Goals reviewed with patient? No  SHORT TERM GOALS: (STG required if POC>30 days)  STG Name Target Date Goal status  1 Pt will be Mod I splinting use, care and precautions R wrist/forearm Baseline:  05/13/2021 INITIAL  2 Pt will be Mod I initial HEP R UE as seen by ability to perform in clinic Baseline:  05/13/2021 INITIAL  3 Pt will be Mod I edema control techniques as seen by ability to state 1-2 techniques w/o vc's Baseline:  05/13/2021 INITIAL   LONG TERM GOALS:   LTG Name Target Date Goal status  1 Pt will be Mod I updated HEP for R UE Baseline:  06/03/2021 INITIAL  2 Pt will be Mod I scar management and desensitization R forearm/wrist as observed in clinic Baseline:  06/03/2021 INITIAL  3 Pt will demonstrate improved A/ROM right hand as seen by ability to make a flat fist or better  Baseline:  06/03/2021 INITIAL  4 Pt will demonstrate improved grip strength R UE as seen by ability to grip 20# or greater via JAMAR dynamometer Baseline:  06/03/2021 INITIAL  5 Update and progress all goals as appropriate after hardware removal Baseline:     PLAN: OT FREQUENCY: 1-2x/week  OT DURATION: 6 weeks  PLANNED INTERVENTIONS: self care/ADL training, therapeutic exercise, therapeutic activity,  scar mobilization, passive range of motion, splinting, ultrasound, fluidotherapy, patient/family education, and DME and/or AE  instructions  PLAN FOR NEXT SESSION: Splint check and adjustment R UE, Pt education [id number] control, review/upgrade home program, scar management.  RECOMMENDED OTHER SERVICES: N/A  CONSULTED AND AGREED WITH PLAN OF CARE: Patient   Almyra Deforest, OT 04/22/2021, 3:34 PM

## 2021-04-22 NOTE — Progress Notes (Deleted)
°  [place] Phone: [phone] Subjective:    I'm seeing this patient by the request  of:  [name], MD  CC:   [id number]  Stephen [name]. is a 61 y.o. male coming in with complaint of back and neck pain. OMT 02/23/2021. Patient states   Medications patient has been prescribed: Effexor  Taking:         Reviewed prior external information including notes and imaging from previsou exam, outside providers and external EMR if available.   As well as notes that were available from care everywhere and other healthcare systems.  Past medical history, social, surgical and family history all reviewed in electronic medical record.  No pertanent information unless stated regarding to the chief complaint.   Past Medical History:  Diagnosis Date   Colon polyps    Detached retina, right 75   Dr. [name] at [facility] did orbital band procedure with good results.    High cholesterol    Hypertension    Poison ivy dermatitis 07/29/2015    No Known Allergies   Review of Systems:  No headache, visual changes, nausea, vomiting, diarrhea, constipation, dizziness, abdominal pain, skin rash, fevers, chills, night sweats, weight loss, swollen lymph nodes, body aches, joint swelling, chest pain, shortness of breath, mood changes. POSITIVE muscle aches  Objective  There were no vitals taken for this visit.   General: No apparent distress alert and oriented x3 mood and affect normal, dressed appropriately.  HEENT: Pupils equal, extraocular movements intact  Respiratory: Patient's speak in full sentences and does not appear short of breath  Cardiovascular: No lower extremity edema, non tender, no erythema  Neuro: Cranial nerves II through XII are intact, neurovascularly intact in all extremities with 2+ DTRs and 2+ pulses.  Gait normal with good balance and coordination.  MSK:  Non tender with full range of  motion and good stability and symmetric strength and tone of shoulders, elbows, wrist, hip, knee and ankles bilaterally.  Back - Normal skin, Spine with normal alignment and no deformity.  No tenderness to vertebral process palpation.  Paraspinous muscles are not tender and without spasm.   Range of motion is full at neck and lumbar sacral regions  Osteopathic findings  C2 flexed rotated and side bent right C6 flexed rotated and side bent left T3 extended rotated and side bent right inhaled rib T9 extended rotated and side bent left L2 flexed rotated and side bent right Sacrum right on right       Assessment and Plan:    Nonallopathic problems  Decision today to treat with OMT was based on Physical Exam  After verbal consent patient was treated with HVLA, ME, FPR techniques in cervical, rib, thoracic, lumbar, and sacral  areas  Patient tolerated the procedure well with improvement in symptoms  Patient given exercises, stretches and lifestyle modifications  See medications in patient instructions if given  Patient will follow up in 4-8 weeks      The above documentation has been reviewed and is accurate and complete Jacqualin Combes       Note: This dictation was prepared with Dragon dictation along with smaller phrase technology. Any transcriptional errors that result from this process are unintentional.

## 2021-04-23 ENCOUNTER — Telehealth: Payer: Self-pay | Admitting: Orthopedic Surgery

## 2021-04-23 NOTE — Telephone Encounter (Signed)
Pt calling to state he has a nerve study next week and is worried it is too soon after surg. Pt wants to know if he needs to change it since his hand is still swollen post surg. The pt stated that mychart is the best way to message him and let him know if he needs to change it or if that NCS is okay this soon after appt.

## 2021-04-23 NOTE — Telephone Encounter (Signed)
Please see message below. Patient had a right wrist ORIF on 04/08/2021

## 2021-04-27 ENCOUNTER — Ambulatory Visit (INDEPENDENT_AMBULATORY_CARE_PROVIDER_SITE_OTHER): Payer: 59 | Admitting: Rehabilitative and Restorative Service Providers"

## 2021-04-27 ENCOUNTER — Encounter: Payer: Self-pay | Admitting: Rehabilitative and Restorative Service Providers"

## 2021-04-27 ENCOUNTER — Telehealth: Payer: Self-pay

## 2021-04-27 ENCOUNTER — Other Ambulatory Visit: Payer: Self-pay

## 2021-04-27 DIAGNOSIS — R278 Other lack of coordination: Secondary | ICD-10-CM

## 2021-04-27 DIAGNOSIS — M25531 Pain in right wrist: Secondary | ICD-10-CM

## 2021-04-27 DIAGNOSIS — R601 Generalized edema: Secondary | ICD-10-CM

## 2021-04-27 DIAGNOSIS — M25641 Stiffness of right hand, not elsewhere classified: Secondary | ICD-10-CM | POA: Diagnosis not present

## 2021-04-27 DIAGNOSIS — M6281 Muscle weakness (generalized): Secondary | ICD-10-CM | POA: Diagnosis not present

## 2021-04-27 NOTE — Telephone Encounter (Signed)
Patient was here today for PT appointment. Following up on message patient left on last Thursday. Please advise.

## 2021-04-27 NOTE — Therapy (Addendum)
OUTPATIENT OCCUPATIONAL THERAPY TREATMENT NOTE   Patient Name: Rylon Poitra. MRN: [id number] DOB:1961/02/22, 61 y.o., male 44 Date: 04/27/2021  PCP: [name], MD REFERRING PROVIDER: [name], MD   OT End of Session - 04/27/21 1423     Visit Number 2    Number of Visits 6    Date for OT Re-Evaluation 05/20/21    Authorization Type [employer]    Progress Note Due on Visit 10   Every 10 th visit   OT Start Time 1300    OT Stop Time 1350    OT Time Calculation (min) 50 min    Activity Tolerance Patient tolerated treatment well;No increased pain    Behavior During Therapy WFL for tasks assessed/performed             Past Medical History:  Diagnosis Date   Colon polyps    Detached retina, right 55   Dr. [name] at [facility] did orbital band procedure with good results.    High cholesterol    Hypertension    Poison ivy dermatitis 07/29/2015   Past Surgical History:  Procedure Laterality Date   COLONOSCOPY  04/2007   Dr.Magod   COLONOSCOPY  09/06/2014   Dr.Magod   OPEN REDUCTION INTERNAL FIXATION (ORIF) DISTAL RADIAL FRACTURE Right 04/08/2021   Procedure: Right OPEN REDUCTION INTERNAL FIXATION (ORIF) DISTAL RADIAL FRACTURE;  Surgeon: [name], MD;  Location: [place];  Service: Orthopedics;  Laterality: Right;   RETINAL DETACHMENT SURGERY     30 years ago   Patient Active Problem List   Diagnosis Date Noted   Closed fracture of right distal radius 04/07/2021   Heme positive stool 08/06/2020   Hordeolum externum of right upper eyelid 08/06/2020   Right hand pain 11/15/2017   Dermatitis due to drug reaction 09/02/2017   Drug-induced erythroderma 09/02/2017   Laryngitis, acute 08/04/2017   Nonallopathic lesion of cervical region 04/06/2017   Nonallopathic lesion of thoracic region 04/06/2017   Nonallopathic lesion of lumbosacral region 04/06/2017   Cervical radiculopathy at C8 03/04/2017   Excessive cerumen in both ear  canals 02/15/2017   Neuropathy, cervical plexus 02/15/2017   Poison ivy dermatitis 07/29/2015   Pharyngitis 09/26/2013   Back strain 06/11/2013   Healthcare maintenance 04/25/2011   Mixed hyperlipidemia 03/14/2010   Essential hypertension 03/14/2010    ONSET DATE: 04/09/2021   REFERRING DIAG: Other closed intra-articular fracture of distal end of right radius, initial encounter   THERAPY DIAG:  Pain in right wrist  Generalized edema  Stiffness of right hand, not elsewhere classified  Other lack of coordination  Muscle weakness (generalized)   PERTINENT HISTORY:  From 04/22/21: X-rays from 04/20/21 demonstrate appropriate alignment of the fracture in both AP and lateral views per Dr Tempie Donning notes and chart review.  Pt reports numbness in digits 2-4. He is scheduled for nerve conduction study on 04/29/21. 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  PRECAUTIONS: ORIF bridge plate R distal radius   SUBJECTIVE:   04/27/21: Pt states fingers are less numb now, but tip of middle is still tingling and is also more stiff than the rest when he tries to close fist. He states doing HEP and having no issues with well fitting orthotic.   From 04/22/21: Date of Surgery: 04/09/2021  Mr. Klugh is a 61  y.o.-year-old male with right distal radius fracture after falling from a step stool in his garage about 2 weeks ago. He had bridge plating of his right distal radius fracture on 04/09/21. His biggest complaint is that he's developed tingling at the tips of his thumb index and ring finger with numbness of the middle finger. This has been new since his surgery per his report. He later stated that he had been being seen for shoulder/neck pain and CTS by Dr Tamala Julian at [facility] "For several years". His incisions are clean, dry, and intact. His  sutures were removed on 04/20/21. He is scheduled for EMG/nerve conduction study on 04/29/21 to evaluate for carpal tunnel syndrome.  He is referred to therapy to work on finger range of motion and edema control. He will f/u with Dr Tempie Donning in 2 weeks.  PAIN:  Are you having pain? No NPRS scale: 0/10 Pain location: no pain but somewhat numb in R middle finger Pain orientation: Right  PAIN TYPE: tingling Pain description: constant  Aggravating factors: none Relieving factors: none    OBJECTIVE:   DIAGNOSTIC FINDINGS: (From 04/22/21 Eval) Pt reports edema in right wrist/hand and lack of A/ROM in R digits. Pt is unable to make a fist and can oppose to his IF only at this time. He presented in his post op cast. His sutures were removed 04/20/21.   ADLs: Overall ADLs: Pt is getting PRN assist from wife and daughter for ADL's (lifting, carrying, opening etc...). 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   FUNCTIONAL OUTCOME MEASURES: Quick Dash Score    61.36 364 % disability   SENSATION: Light touch: Impaired:   Semmes Weinstein Monofilament scale:  Right hand thumb = 4.31, IF 4.31, MF = 4.56, small = 3.61   COORDINATION: (NEW 04/27/21) 9 Hole Peg test: Right: NT sec; Left: NT sec Box and Blocks: 45 Comments: performed in orthotic, falls > 2 standard deviations (53), coordination is Impaired.    EDEMA: much better now 04/27/21   MUSCLE TONE: WNL's     SKIN INTEGRITY: Surgical sites well healed, very small amount of eschar present still    PALPATION: non-tender to LT today (2//23)    UE A/ROM:   A/ROM Right 04/27/2021  Shoulder flexion Greatly improve WNL now  Shoulder abduction   Shoulder adduction   Shoulder extension    Shoulder internal rotation   Shoulder external rotation Greatly improve WNL now  Elbow flexion WNL with neutral pronation  Elbow extension WNL with neutral pronation  Wrist flexion NT  Wrist extension  NT   Wrist ulnar deviation NT   Wrist radial deviation  NT  Forearm pronation  Improving to ~80* now  Forearm supination  Improving to ~40* now  (Blank rows = not tested)   HAND A/ROM:   A/ROM Right 04/27/2021  Thumb MCP (0-60)   Thumb IP (0-80)   Thumb Radial abd/add (0-55)   Thumb Palmar abd/add (0-45)   Thumb opposition to index  WNL now  Index MCP (0-90)   Index PIP (0-100)    Index DIP (0-70)    Long MCP (0-90)    Long PIP (0-100)    Long DIP (0-70)    Ring MCP (0-90_    Ring PIP (0-100)    Ring DIP (0-70)    Little MCP (0-90)    Little PIP (0-100)    Little DIP (0-70)    (Blank rows = not tested)  UE MMT: NT as of 04/27/21   MMT Right 04/22/2021 Left 04/22/2021  Shoulder flexion    Shoulder abduction      Shoulder adduction      Shoulder extension      Shoulder internal rotation      Shoulder external rotation      Middle trapezius      Lower trapezius      Elbow flexion      Elbow extension      Wrist flexion      Wrist extension      Wrist ulnar deviation      Wrist radial deviation      Wrist pronation      Wrist supination      (Blank rows = not tested)   HAND FUNCTION:   Grip strength: Right: NT lbs; Left: NT lbs Lateral pinch: Right: NT lbs, Left: NT lbs 3 point pinch: Right: NT lbs, Left: NT lbs Comments: Deferred   TODAY'S TREATMENT:   04/27/21: Pt reviews HEP and recommendations for scar & edema management. Also performs shoulder, elbow, hand A/ROM, educated on forearm pro/supination to comfortable end ranges, he is able to perform with no pain, also performs paired with elbow flex & ext. Additionally, he is edu on very light middle finger flexion stretch to help with tightness c/o. He does well without pain, states feeling somewhat looser at end. OT re-explains that he should avoid tension on wrist in flex/ext/deviations, but that he can now perform I/ADLs with R hand/arm that are not weighted or repetitive (like signing name or picking up a piece of  paper). He states understanding and also performs Box and Blocks Test today, scoring 45 (impaired coordination). He is motivate dthat he is able to perform the test, however.   From 04/22/21: 1) Pt post-op dressing was removed in the clinic. Pt was educated in gentle dorsal wrist and forearm scars massage 2-3x/day for 5-10 min sessions.  2) Pt was then fitted with a protective custom fabricated forearm/wrist volar splint that places his right wrist in neutral position. Pt was educated in splinting use, care and precautions both written and verbally. Pt will wear the splint at all times and wrap to bathe until directed otherwise by Dr Tempie Donning. 3) Pt was educated in a HEP for active ROM right hand tendon gliding, opposition 4-6x/day, elbow flexion/extension, shoulder flexion, ABD with IR/ER. Pt was educated through Engineer, manufacturing.      PATIENT EDUCATION: Education details: HEP, splinting use, care and precautions. Person educated: Patient Education method: Explanation, Demonstration, Verbal cues, and Handouts Education comprehension: verbalized understanding, returned demonstration, and needs further education     HOME EXERCISE PROGRAM: See treatment section above   ASSESSMENT:   CLINICAL IMPRESSION: 04/27/21: Improving motion, sensation and functional ability. Keep on POC    From 04/22/21: Patient is a 61 y.o. R HD male who was seen today for occupational therapy evaluation and treatment for right distal radius bridge plating two weeks ago s/p fracture. He was also referred for edema control and finger ROM . Patient has performance deficits in functional skills including ADLs, coordination, dexterity, edema, ROM, strength, pain, flexibility, FMC, decreased knowledge of precautions, and UE functional use. These impairments are limiting patient from ADLs, work, and leisure. Patient may have co-morbidities  that affects occupational performance. Patient will benefit from skilled OT  to address above impairments and improve overall function.     GOALS: Goals reviewed with patient? No   SHORT TERM GOALS: (  STG required if POC>30 days)   STG Name Target Date Goal status  1 Pt will be Mod I splinting use, care and precautions R wrist/forearm Baseline:  05/13/2021 MET  2 Pt will be Mod I initial HEP R UE as seen by ability to perform in clinic Baseline:  05/13/2021 MET and Upgrading HEP  3 Pt will be Mod I edema control techniques as seen by ability to state 1-2 techniques w/o vc's Baseline:  05/13/2021 INITIAL    LONG TERM GOALS:    LTG Name Target Date Goal status  1 Pt will be Mod I updated HEP for R UE Baseline:  06/03/2021 Progressing  2 Pt will be Mod I scar management and desensitization R forearm/wrist as observed in clinic Baseline:  06/03/2021 Progressing  3 Pt will demonstrate improved A/ROM right hand as seen by ability to make a flat fist or better  Baseline:  06/03/2021 Progressing  4 Pt will demonstrate improved grip strength R UE as seen by ability to grip 20# or greater via JAMAR dynamometer Baseline:  06/03/2021 Progressing  5 Update and progress all goals as appropriate after hardware removal    TBD   PLAN: OT FREQUENCY: 1-2x/week   OT DURATION: 6 weeks   PLANNED INTERVENTIONS: self care/ADL training, therapeutic exercise, therapeutic activity, scar mobilization, passive range of motion, splinting, ultrasound, fluidotherapy, patient/family education, and DME and/or AE instructions   PLAN FOR NEXT SESSION:  Skin/orthotic check, Review HEP, encourage functional ability, coordination and FMS activities.    RECOMMENDED OTHER SERVICES: N/A   CONSULTED AND AGREED WITH PLAN OF CARE: Patient    Benito Mccreedy, OTR/L, CHT 04/27/2021, 3:04 PM

## 2021-04-28 ENCOUNTER — Ambulatory Visit (INDEPENDENT_AMBULATORY_CARE_PROVIDER_SITE_OTHER): Payer: 59 | Admitting: Orthopedic Surgery

## 2021-04-28 ENCOUNTER — Ambulatory Visit: Payer: 59 | Admitting: Family Medicine

## 2021-04-28 DIAGNOSIS — S52571A Other intraarticular fracture of lower end of right radius, initial encounter for closed fracture: Secondary | ICD-10-CM

## 2021-04-28 NOTE — Progress Notes (Signed)
° °  Post-Op Visit Note   Patient: Stephen [name].           Date of Birth: March 30, 1960           MRN: [id number] Visit Date: 04/28/2021 PCP: [name], MD   Assessment & Plan:  Chief Complaint: No chief complaint on file.  Visit Diagnoses:  1. Other closed intra-articular fracture of distal end of right radius, initial encounter     Plan: Patient doing well.  Stiffness in the fingers with near full PROM.  Mostly stiff at the MP joints.  Still w/ numbness and paresthesias in the middle finger. Will continue therapy to work on ROM.   Follow-Up Instructions: No follow-ups on file.   Orders:  No orders of the defined types were placed in this encounter.  No orders of the defined types were placed in this encounter.   Imaging: No results found.  PMFS History: Patient Active Problem List   Diagnosis Date Noted   Closed fracture of right distal radius 04/07/2021   Heme positive stool 08/06/2020   Hordeolum externum of right upper eyelid 08/06/2020   Right hand pain 11/15/2017   Dermatitis due to drug reaction 09/02/2017   Drug-induced erythroderma 09/02/2017   Laryngitis, acute 08/04/2017   Nonallopathic lesion of cervical region 04/06/2017   Nonallopathic lesion of thoracic region 04/06/2017   Nonallopathic lesion of lumbosacral region 04/06/2017   Cervical radiculopathy at C8 03/04/2017   Excessive cerumen in both ear canals 02/15/2017   Neuropathy, cervical plexus 02/15/2017   Poison ivy dermatitis 07/29/2015   Pharyngitis 09/26/2013   Back strain 06/11/2013   Healthcare maintenance 04/25/2011   Mixed hyperlipidemia 03/14/2010   Essential hypertension 03/14/2010   Past Medical History:  Diagnosis Date   Colon polyps    Detached retina, right 45   Dr. [name] at [facility] did orbital band procedure with good results.    High cholesterol    Hypertension    Poison ivy dermatitis 07/29/2015    Family History  Problem Relation Age of Onset   Parkinsonism  Mother    Colon polyps Father    Colon cancer Father 47   Cancer Father        colon cancer with mets   Hyperlipidemia Father    Hypertension Father    Hypertension Brother    Diabetes Neg Hx    Heart disease Neg Hx    Esophageal cancer Neg Hx    Rectal cancer Neg Hx    Stomach cancer Neg Hx     Past Surgical History:  Procedure Laterality Date   COLONOSCOPY  04/2007   Dr.Magod   COLONOSCOPY  09/06/2014   Dr.Magod   OPEN REDUCTION INTERNAL FIXATION (ORIF) DISTAL RADIAL FRACTURE Right 04/08/2021   Procedure: Right OPEN REDUCTION INTERNAL FIXATION (ORIF) DISTAL RADIAL FRACTURE;  Surgeon: [name], MD;  Location: [place];  Service: Orthopedics;  Laterality: Right;   RETINAL DETACHMENT SURGERY     30 years ago   Social History   Occupational History   Occupation: busines    Employer: [employer]  Tobacco Use   Smoking status: Never   Smokeless tobacco: Never  Vaping Use   Vaping Use: Never used  Substance and Sexual Activity   Alcohol use: Not Currently    Comment: occ beer   Drug use: No   Sexual activity: Yes    Partners: Female

## 2021-04-29 ENCOUNTER — Encounter: Payer: 59 | Admitting: Physical Medicine and Rehabilitation

## 2021-04-29 ENCOUNTER — Other Ambulatory Visit: Payer: Self-pay | Admitting: Family Medicine

## 2021-04-29 ENCOUNTER — Other Ambulatory Visit (HOSPITAL_BASED_OUTPATIENT_CLINIC_OR_DEPARTMENT_OTHER): Payer: Self-pay

## 2021-04-29 MED ORDER — VENLAFAXINE HCL ER 37.5 MG PO CP24
ORAL_CAPSULE | ORAL | 1 refills | Status: DC
  Filled 2021-04-29: qty 180, 90d supply, fill #0
  Filled 2021-07-30: qty 180, 90d supply, fill #1

## 2021-05-01 ENCOUNTER — Encounter: Payer: Self-pay | Admitting: Rehabilitative and Restorative Service Providers"

## 2021-05-01 ENCOUNTER — Ambulatory Visit (INDEPENDENT_AMBULATORY_CARE_PROVIDER_SITE_OTHER): Payer: 59 | Admitting: Rehabilitative and Restorative Service Providers"

## 2021-05-01 ENCOUNTER — Other Ambulatory Visit: Payer: Self-pay

## 2021-05-01 DIAGNOSIS — M25641 Stiffness of right hand, not elsewhere classified: Secondary | ICD-10-CM

## 2021-05-01 DIAGNOSIS — M25531 Pain in right wrist: Secondary | ICD-10-CM

## 2021-05-01 DIAGNOSIS — M79641 Pain in right hand: Secondary | ICD-10-CM | POA: Diagnosis not present

## 2021-05-01 DIAGNOSIS — R278 Other lack of coordination: Secondary | ICD-10-CM

## 2021-05-01 DIAGNOSIS — R601 Generalized edema: Secondary | ICD-10-CM

## 2021-05-01 DIAGNOSIS — M6281 Muscle weakness (generalized): Secondary | ICD-10-CM

## 2021-05-01 NOTE — Therapy (Signed)
OUTPATIENT OCCUPATIONAL THERAPY TREATMENT NOTE   Patient Name: Stephen [name]. MRN: [id number] DOB:[birth year], 61 y.o., male 65 Date: 05/01/2021  PCP: [name], MD REFERRING PROVIDER: [name], MD   OT End of Session - 05/01/21 0930     Visit Number 3    Number of Visits 6    Date for OT Re-Evaluation 05/20/21    Authorization Type [employer]    Progress Note Due on Visit 10   Every 10 th visit   OT Start Time [phone]    OT Stop Time 1011    OT Time Calculation (min) 40 min    Activity Tolerance Patient tolerated treatment well;No increased pain    Behavior During Therapy WFL for tasks assessed/performed              Past Medical History:  Diagnosis Date   Colon polyps    Detached retina, right 23   Dr. [name] at [facility] did orbital band procedure with good results.    High cholesterol    Hypertension    Poison ivy dermatitis 07/29/2015   Past Surgical History:  Procedure Laterality Date   COLONOSCOPY  04/2007   Dr.Magod   COLONOSCOPY  09/06/2014   Dr.Magod   OPEN REDUCTION INTERNAL FIXATION (ORIF) DISTAL RADIAL FRACTURE Right 04/08/2021   Procedure: Right OPEN REDUCTION INTERNAL FIXATION (ORIF) DISTAL RADIAL FRACTURE;  Surgeon: [name], MD;  Location: [place];  Service: Orthopedics;  Laterality: Right;   RETINAL DETACHMENT SURGERY     30 years ago   Patient Active Problem List   Diagnosis Date Noted   Closed fracture of right distal radius 04/07/2021   Heme positive stool 08/06/2020   Hordeolum externum of right upper eyelid 08/06/2020   Right hand pain 11/15/2017   Dermatitis due to drug reaction 09/02/2017   Drug-induced erythroderma 09/02/2017   Laryngitis, acute 08/04/2017   Nonallopathic lesion of cervical region 04/06/2017   Nonallopathic lesion of thoracic region 04/06/2017   Nonallopathic lesion of lumbosacral region 04/06/2017   Cervical radiculopathy at C8 03/04/2017   Excessive cerumen in both ear  canals 02/15/2017   Neuropathy, cervical plexus 02/15/2017   Poison ivy dermatitis 07/29/2015   Pharyngitis 09/26/2013   Back strain 06/11/2013   Healthcare maintenance 04/25/2011   Mixed hyperlipidemia 03/14/2010   Essential hypertension 03/14/2010    ONSET DATE: 04/09/2021   REFERRING DIAG: Other closed intra-articular fracture of distal end of right radius, initial encounter   THERAPY DIAG:  Pain in right wrist  Right hand pain  Generalized edema  Stiffness of right hand, not elsewhere classified  Muscle weakness (generalized)  Other lack of coordination   PERTINENT HISTORY:  From 04/22/21: X-rays from 04/20/21 demonstrate appropriate alignment of the fracture in both AP and lateral views per Dr Tempie Donning notes and chart review.  Pt reports numbness in digits 2-4. He is scheduled for nerve conduction study on 04/29/21. 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  PRECAUTIONS: ORIF bridge plate R distal radius   SUBJECTIVE:  05/01/21: He states MD advised to hold on EMG, he is still numb today in Median Nerve distribution. He states doing HEP with no significant issues   04/27/21: Pt states fingers are less numb now, but tip of middle is still tingling and is also more stiff than the rest  when he tries to close fist. He states doing HEP and having no issues with well fitting orthotic.   From 04/22/21: Date of Surgery: 04/09/2021  [name] is a 61 y.o.-year-old male with right distal radius fracture after falling from a step stool in his garage about 2 weeks ago. He had bridge plating of his right distal radius fracture on 04/09/21. His biggest complaint is that he's developed tingling at the tips of his thumb index and ring finger with numbness of the middle finger. This has been new since his surgery per his report. He  later stated that he had been being seen for shoulder/neck pain and CTS by Dr Tamala Julian at [place] "For several years". His incisions are clean, dry, and intact. His sutures were removed on 04/20/21. He is scheduled for EMG/nerve conduction study on 04/29/21 to evaluate for carpal tunnel syndrome.  He is referred to therapy to work on finger range of motion and edema control. He will f/u with Dr Tempie Donning in 2 weeks.  PAIN:  Are you having pain? No NPRS scale: 0/10 Pain location: no pain but still numb in median nerve distribution Pain orientation: Right  PAIN TYPE: tingling Pain description: constant  Aggravating factors: none Relieving factors: none    OBJECTIVE:   DIAGNOSTIC FINDINGS: (From 04/22/21 Eval) Pt reports edema in right wrist/hand and lack of A/ROM in R digits. Pt is unable to make a fist and can oppose to his IF only at this time. He presented in his post op cast. His sutures were removed 04/20/21.   ADLs: Overall ADLs: Pt is getting PRN assist from wife and daughter for ADL's (lifting, carrying, opening etc...). 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   FUNCTIONAL OUTCOME MEASURES: Katina Dung Score    61.36 364 % disability   SENSATION: New 05/01/21  Light touch: Impaired Semmes Weinstein Monofilament scale:  Right hand thumb = 4.31 (diminished protective sensation), IF= 4.56 (absent protective sensation), MF = 6.65 unable (insensate), small = 2.83 (normal)    COORDINATION: (04/27/21) 9 Hole Peg test: Right: NT sec; Left: NT sec Box and Blocks: 45 Comments: performed in orthotic, falls > 2 standard deviations (53), coordination is Impaired.    EDEMA: much better now 04/27/21   MUSCLE TONE: WNL's     SKIN INTEGRITY: Surgical sites well healed, very small amount of eschar present still    PALPATION: non-tender to LT today (2//23)    UE A/ROM:   A/ROM Right 04/27/2021  Shoulder flexion Greatly improve WNL now  Shoulder  abduction   Shoulder adduction   Shoulder extension    Shoulder internal rotation   Shoulder external rotation Greatly improve WNL now  Elbow flexion WNL with neutral pronation  Elbow extension WNL with neutral pronation  Wrist flexion NT  Wrist extension  NT  Wrist ulnar deviation NT   Wrist radial deviation  NT  Forearm pronation  Improving to ~80* now  Forearm supination  Improving to ~40* now  (Blank rows = not tested)   HAND A/ROM:   A/ROM Right 05/01/2021  Thumb MCP (0-60)   Thumb IP (0-80)   Thumb Radial abd/add (0-55)   Thumb Palmar abd/add (0-45)   Thumb opposition to index    Index MCP (0-90)   Index PIP (0-100)    Index DIP (0-70)    Long MCP (0-90) 0-41   Long PIP (0-100) 0-69   Long DIP (0-70) 0-35  Ring  MCP (0-90_    Ring PIP (0-100)    Ring DIP (0-70)    Little MCP (0-90)    Little PIP (0-100)    Little DIP (0-70)    (Blank rows = not tested)   UE MMT:   MMT Right 04/22/2021 Left 04/22/2021  Shoulder flexion    Shoulder abduction      Shoulder adduction      Shoulder extension      Shoulder internal rotation      Shoulder external rotation      Middle trapezius      Lower trapezius      Elbow flexion      Elbow extension      Wrist flexion      Wrist extension      Wrist ulnar deviation      Wrist radial deviation      Wrist pronation      Wrist supination      (Blank rows = not tested)   HAND FUNCTION:   Grip strength: Right: NT lbs; Left: NT lbs Lateral pinch: Right: NT lbs, Left: NT lbs 3 point pinch: Right: NT lbs, Left: NT lbs Comments: Deferred   TODAY'S TREATMENT:   05/01/21: OT reviews HEP with pt who performs FA A/ROM, tendon glides, and opposition concurrently with fluidotherapy for heat and pain relief.  Afterwards, OT performs light manual P/ROM and stretches to FA in pro/sup, fingers at MCPs and in intrinsic minus position. Retrograde massage done as well with pt edu to add to HEP.  OT measures MF A/ROM for status check (will  be compared in next sessions), but overall he is moving more, less guarded today. Lastly, he is edu in functional fine motor and coordination activities with a writing utensil & performs, dropping pen at times but making slow improvements over time in rotation, translation, shift and other in-hand manipulation skills. These were added to HEP as well.    04/27/21: Pt reviews HEP and recommendations for scar & edema management. Also performs shoulder, elbow, hand A/ROM, educated on forearm pro/supination to comfortable end ranges, he is able to perform with no pain, also performs paired with elbow flex & ext. Additionally, he is edu on very light middle finger flexion stretch to help with tightness c/o. He does well without pain, states feeling somewhat looser at end. OT re-explains that he should avoid tension on wrist in flex/ext/deviations, but that he can now perform I/ADLs with R hand/arm that are not weighted or repetitive (like signing name or picking up a piece of paper). He states understanding and also performs Box and Blocks Test today, scoring 45 (impaired coordination). He is motivate dthat he is able to perform the test, however.     PATIENT EDUCATION: Education details: HEP, splinting use, care and precautions. Person educated: Patient Education method: Explanation, Demonstration, Verbal cues, and Handouts Education comprehension: verbalized understanding, returned demonstration, and needs further education     HOME EXERCISE PROGRAM: See treatment section above   ASSESSMENT:   CLINICAL IMPRESSION: 05/01/21: improving motion, pain low/non-existent, numbness in median nerve dist still dense, but will be tracked.  Will add nerve glides as well    04/27/21: Improving motion, sensation and functional ability. Keep on POC    From 04/22/21: Patient is a 61 y.o. R HD male who was seen today for occupational therapy evaluation and treatment for right distal radius bridge plating two weeks ago  s/p fracture. He was also referred for edema control and finger  ROM . Patient has performance deficits in functional skills including ADLs, coordination, dexterity, edema, ROM, strength, pain, flexibility, FMC, decreased knowledge of precautions, and UE functional use. These impairments are limiting patient from ADLs, work, and leisure. Patient may have co-morbidities  that affects occupational performance. Patient will benefit from skilled OT to address above impairments and improve overall function.     GOALS: Goals reviewed with patient? No   SHORT TERM GOALS: (STG required if POC>30 days)   STG Name Target Date Goal status  1 Pt will be Mod I splinting use, care and precautions R wrist/forearm Baseline:  05/13/2021 MET  2 Pt will be Mod I initial HEP R UE as seen by ability to perform in clinic Baseline:  05/13/2021 MET and Upgrading HEP  3 Pt will be Mod I edema control techniques as seen by ability to state 1-2 techniques w/o vc's Baseline:  05/13/2021 INITIAL    LONG TERM GOALS:    LTG Name Target Date Goal status  1 Pt will be Mod I updated HEP for R UE Baseline:  06/03/2021 Progressing  2 Pt will be Mod I scar management and desensitization R forearm/wrist as observed in clinic Baseline:  06/03/2021 Progressing  3 Pt will demonstrate improved A/ROM right hand as seen by ability to make a flat fist or better  Baseline:  06/03/2021 Progressing  4 Pt will demonstrate improved grip strength R UE as seen by ability to grip 20# or greater via JAMAR dynamometer Baseline:  06/03/2021 Progressing  5 Update and progress all goals as appropriate after hardware removal    TBD   PLAN: OT FREQUENCY: 1-2x/week   OT DURATION: 6 weeks   PLANNED INTERVENTIONS: self care/ADL training, therapeutic exercise, therapeutic activity, scar mobilization, passive range of motion, splinting, ultrasound, fluidotherapy, patient/family education, and DME and/or AE instructions   PLAN FOR NEXT SESSION:   Skin/orthotic check, Review HEP, add nerve glides as well, encourage functional ability, & return of sensation.    RECOMMENDED OTHER SERVICES: N/A   CONSULTED AND AGREED WITH PLAN OF CARE: Patient    Benito Mccreedy, OTR/L, CHT 05/01/2021, 1:29 PM

## 2021-05-04 ENCOUNTER — Other Ambulatory Visit: Payer: Self-pay

## 2021-05-04 ENCOUNTER — Ambulatory Visit (INDEPENDENT_AMBULATORY_CARE_PROVIDER_SITE_OTHER): Payer: 59 | Admitting: Rehabilitative and Restorative Service Providers"

## 2021-05-04 ENCOUNTER — Encounter: Payer: Self-pay | Admitting: Rehabilitative and Restorative Service Providers"

## 2021-05-04 DIAGNOSIS — R278 Other lack of coordination: Secondary | ICD-10-CM | POA: Diagnosis not present

## 2021-05-04 DIAGNOSIS — M6281 Muscle weakness (generalized): Secondary | ICD-10-CM | POA: Diagnosis not present

## 2021-05-04 DIAGNOSIS — M25641 Stiffness of right hand, not elsewhere classified: Secondary | ICD-10-CM | POA: Diagnosis not present

## 2021-05-04 DIAGNOSIS — M79641 Pain in right hand: Secondary | ICD-10-CM | POA: Diagnosis not present

## 2021-05-04 DIAGNOSIS — M25531 Pain in right wrist: Secondary | ICD-10-CM

## 2021-05-04 DIAGNOSIS — R601 Generalized edema: Secondary | ICD-10-CM

## 2021-05-04 NOTE — Therapy (Signed)
OUTPATIENT OCCUPATIONAL THERAPY TREATMENT NOTE   Patient Name: Stephen [name]. MRN: [id number] DOB:19-Oct-1960, 61 y.o., male 24 Date: 05/04/2021  PCP: [name], MD REFERRING PROVIDER: [name],*   OT End of Session - 05/04/21 1300     Visit Number 4    Number of Visits 6    Date for OT Re-Evaluation 05/20/21    Authorization Type [employer]    Progress Note Due on Visit 10   Every 10 th visit   OT Start Time 1300    OT Stop Time 1348    OT Time Calculation (min) 48 min    Equipment Utilized During Treatment edema glove and coban given out    Activity Tolerance Patient tolerated treatment well;No increased pain    Behavior During Therapy WFL for tasks assessed/performed               Past Medical History:  Diagnosis Date   Colon polyps    Detached retina, right 83   Dr. [name] at [facility] did orbital band procedure with good results.    High cholesterol    Hypertension    Poison ivy dermatitis 07/29/2015   Past Surgical History:  Procedure Laterality Date   COLONOSCOPY  04/2007   Dr.Magod   COLONOSCOPY  09/06/2014   Dr.Magod   OPEN REDUCTION INTERNAL FIXATION (ORIF) DISTAL RADIAL FRACTURE Right 04/08/2021   Procedure: Right OPEN REDUCTION INTERNAL FIXATION (ORIF) DISTAL RADIAL FRACTURE;  Surgeon: [name], MD;  Location: [place];  Service: Orthopedics;  Laterality: Right;   RETINAL DETACHMENT SURGERY     30 years ago   Patient Active Problem List   Diagnosis Date Noted   Closed fracture of right distal radius 04/07/2021   Heme positive stool 08/06/2020   Hordeolum externum of right upper eyelid 08/06/2020   Right hand pain 11/15/2017   Dermatitis due to drug reaction 09/02/2017   Drug-induced erythroderma 09/02/2017   Laryngitis, acute 08/04/2017   Nonallopathic lesion of cervical region 04/06/2017   Nonallopathic lesion of thoracic region 04/06/2017   Nonallopathic lesion of lumbosacral region 04/06/2017    Cervical radiculopathy at C8 03/04/2017   Excessive cerumen in both ear canals 02/15/2017   Neuropathy, cervical plexus 02/15/2017   Poison ivy dermatitis 07/29/2015   Pharyngitis 09/26/2013   Back strain 06/11/2013   Healthcare maintenance 04/25/2011   Mixed hyperlipidemia 03/14/2010   Essential hypertension 03/14/2010    ONSET DATE: 04/09/2021   REFERRING DIAG: Other closed intra-articular fracture of distal end of right radius, initial encounter   THERAPY DIAG:  Right hand pain  Pain in right wrist  Generalized edema  Other lack of coordination  Muscle weakness (generalized)  Stiffness of right hand, not elsewhere classified   PERTINENT HISTORY:  From 04/22/21: X-rays from 04/20/21 demonstrate appropriate alignment of the fracture in both AP and lateral views per Dr Tempie Donning notes and chart review.  Pt reports numbness in digits 2-4. He is scheduled for nerve conduction study on 04/29/21. 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  PRECAUTIONS: ORIF bridge plate R distal radius   SUBJECTIVE:  05/04/21: Pt states doing well, no pain, fingers seem to be moving better.    05/01/21: He states MD advised to hold on EMG, he is still numb today in Median Nerve distribution. He states doing  HEP with no significant issues   04/27/21: Pt states fingers are less numb now, but tip of middle is still tingling and is also more stiff than the rest when he tries to close fist. He states doing HEP and having no issues with well fitting orthotic.     PAIN:  Are you having pain? No NPRS scale: 0/10 Pain location: no pain but still numb in median nerve distribution Pain orientation: Right  PAIN TYPE: tingling Pain description: constant  Aggravating factors: none Relieving factors: none  OBJECTIVE:   DIAGNOSTIC  FINDINGS: (From 04/22/21 Eval) Pt reports edema in right wrist/hand and lack of A/ROM in R digits. Pt is unable to make a fist and can oppose to his IF only at this time. He presented in his post op cast. His sutures were removed 04/20/21.   ADLs: Overall ADLs: Pt is getting PRN assist from wife and daughter for ADL's (lifting, carrying, opening etc...). 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   FUNCTIONAL OUTCOME MEASURES: Katina Dung Score    61.36 364 % disability   SENSATION: New 05/01/21  Light touch: Impaired Semmes Weinstein Monofilament scale:  Right hand thumb = 4.31 (diminished protective sensation), IF= 4.56 (absent protective sensation), MF = 6.65 unable (insensate), small = 2.83 (normal)    COORDINATION: (04/27/21) 9 Hole Peg test: Right: NT sec; Left: NT sec Box and Blocks: 45 Comments: performed in orthotic, falls > 2 standard deviations (53), coordination is Impaired.    EDEMA: much better now 04/27/21   MUSCLE TONE: WNL's     SKIN INTEGRITY: Surgical sites well healed, very small amount of eschar present still    PALPATION: non-tender to LT today (2//23)    UE A/ROM:   A/ROM Right 04/27/2021  Shoulder flexion Greatly improve WNL now  Shoulder abduction   Shoulder adduction   Shoulder extension    Shoulder internal rotation   Shoulder external rotation Greatly improve WNL now  Elbow flexion WNL with neutral pronation  Elbow extension WNL with neutral pronation  Wrist flexion NT  Wrist extension  NT  Wrist ulnar deviation NT   Wrist radial deviation  NT  Forearm pronation  Improving to ~80* now  Forearm supination  Improving to ~40* now  (Blank rows = not tested)   HAND A/ROM: (taking MF measures only as a sample of overall hand motion)    A/ROM Right 05/01/2021 Right  05/04/21  Long MCP (0-90) 0-41  0-45  Long PIP (0-100) 0-69  0-92  Long DIP (0-70) 0-35 0-64  (Blank rows = not tested)   UE MMT: (not tested  until resistance appropriate)    MMT Right 04/22/2021 Left 04/22/2021  Shoulder flexion    Shoulder abduction      Shoulder adduction      Shoulder extension      Shoulder internal rotation      Shoulder external rotation      Middle trapezius      Lower trapezius      Elbow flexion      Elbow extension      Wrist flexion      Wrist extension      Wrist ulnar deviation      Wrist radial deviation      Wrist pronation      Wrist supination      (Blank rows = not tested)   HAND FUNCTION:   Grip strength: Right: NT lbs;  Left: NT lbs Lateral pinch: Right: NT lbs, Left: NT lbs 3 point pinch: Right: NT lbs, Left: NT lbs Comments: Deferred   TODAY'S TREATMENT:   05/04/21: Pt reviews HEP and performs A/ROM at [facility] and wrist concurrent and overlap with 7 mins in fluidotherapy. OT performs manual stretches for FA in pro/sup also at fingers in composite flexion and intrinsic minus positions. Measures taken "cold" today were much better than post-visit measures last session! Pt making excellent progress. Lastly, he learns and performs median nerve glides to help address numbness- he states no pain but does have changes in his baseline tingling during. Add to help 2-3xday gently. Pt also provided with edema glove to help with swelling at night and coban wrap to perform passive stretch at fingers (is edu how to do).    05/01/21: OT reviews HEP with pt who performs FA A/ROM, tendon glides, and opposition concurrently with fluidotherapy for heat and pain relief.  Afterwards, OT performs light manual P/ROM and stretches to FA in pro/sup, fingers at MCPs and in intrinsic minus position. Retrograde massage done as well with pt edu to add to HEP.  OT measures MF A/ROM for status check (will be compared in next sessions), but overall he is moving more, less guarded today. Lastly, he is edu in functional fine motor and coordination activities with a writing utensil & performs, dropping pen at times but making  slow improvements over time in rotation, translation, shift and other in-hand manipulation skills. These were added to HEP as well.       PATIENT EDUCATION: Education details: HEP, splinting use, care and precautions. Person educated: Patient Education method: Explanation, Demonstration, Verbal cues, and Handouts Education comprehension: verbalized understanding, returned demonstration, and needs further education     HOME EXERCISE PROGRAM: See treatment section above [id number]   ASSESSMENT:   CLINICAL IMPRESSION: 05/04/21:Great AROM progress in fingers now, hopefully nerve glides help sensation. We will back down to 1/week now that he knows HEP and is managing well, current POC will have reassessment in 2 more visits, then likely be put on hold until bridge plate removed.   05/01/21: improving motion, pain low/non-existent, numbness in median nerve dist still dense, but will be tracked.  Will add nerve glides as well    04/27/21: Improving motion, sensation and functional ability. Keep on POC    From 04/22/21: Patient is a 61 y.o. R HD male who was seen today for occupational therapy evaluation and treatment for right distal radius bridge plating two weeks ago s/p fracture. He was also referred for edema control and finger ROM . Patient has performance deficits in functional skills including ADLs, coordination, dexterity, edema, ROM, strength, pain, flexibility, FMC, decreased knowledge of precautions, and UE functional use. These impairments are limiting patient from ADLs, work, and leisure. Patient may have co-morbidities  that affects occupational performance. Patient will benefit from skilled OT to address above impairments and improve overall function.     GOALS: Goals reviewed with patient? No   SHORT TERM GOALS: (STG required if POC>30 days)   STG Name Target Date Goal status  1 Pt will be Mod I splinting use, care and precautions R wrist/forearm Baseline:  05/13/2021 MET  2 Pt  will be Mod I initial HEP R UE as seen by ability to perform in clinic Baseline:  05/13/2021 MET and Upgrading HEP  3 Pt will be Mod I edema control techniques as seen by ability to state 1-2 techniques w/o vc's Baseline:  05/13/2021  INITIAL    LONG TERM GOALS:    LTG Name Target Date Goal status  1 Pt will be Mod I updated HEP for R UE Baseline:  06/03/2021 Progressing  2 Pt will be Mod I scar management and desensitization R forearm/wrist as observed in clinic Baseline:  06/03/2021 Progressing  3 Pt will demonstrate improved A/ROM right hand as seen by ability to make a flat fist or better  Baseline:  06/03/2021 Progressing  4 Pt will demonstrate improved grip strength R UE as seen by ability to grip 20# or greater via JAMAR dynamometer Baseline:  06/03/2021 Progressing  5 Update and progress all goals as appropriate after hardware removal    TBD   PLAN: OT FREQUENCY: 1-2x/week   OT DURATION: 6 weeks   PLANNED INTERVENTIONS: self care/ADL training, therapeutic exercise, therapeutic activity, scar mobilization, passive range of motion, splinting, ultrasound, fluidotherapy, patient/family education, and DME and/or AE instructions   PLAN FOR NEXT SESSION:  Check HEP, orthotic, goals and motion, consider going on hold for 1-2 weeks before reassessment, if pt is comfortable with that.    RECOMMENDED OTHER SERVICES: N/A   CONSULTED AND AGREED WITH PLAN OF CARE: Patient    Benito Mccreedy, OTR/L, CHT 05/04/2021, 1:51 PM

## 2021-05-05 ENCOUNTER — Encounter: Payer: 59 | Admitting: Orthopedic Surgery

## 2021-05-05 ENCOUNTER — Ambulatory Visit (INDEPENDENT_AMBULATORY_CARE_PROVIDER_SITE_OTHER): Payer: 59 | Admitting: Orthopedic Surgery

## 2021-05-05 ENCOUNTER — Encounter: Payer: Self-pay | Admitting: Orthopedic Surgery

## 2021-05-05 VITALS — BP 133/80 | HR 67

## 2021-05-05 DIAGNOSIS — S52571A Other intraarticular fracture of lower end of right radius, initial encounter for closed fracture: Secondary | ICD-10-CM

## 2021-05-05 NOTE — Progress Notes (Signed)
Post-Op Visit Note   Patient: Stephen [name].           Date of Birth: Mar 01, 1961           MRN: [id number] Visit Date: 05/05/2021 PCP: [name], MD   Assessment & Plan:  Chief Complaint:  Chief Complaint  Patient presents with   Right Wrist - Follow-up   Visit Diagnoses:  1. Other closed intra-articular fracture of distal end of right radius, initial encounter     Plan: Patient's range of motion is greatly improved since I saw him last week with therapy.  He has near complete active range of motion of this middle finger and can make a near full composite fist.  His swelling is continuing to improve.  He thinks the paresthesias in this middle finger are also improving.  I will see him back in 2 weeks for repeat x-rays after which we can likely space her visits out.  Follow-Up Instructions: No follow-ups on file.   Orders:  No orders of the defined types were placed in this encounter.  No orders of the defined types were placed in this encounter.   Imaging: No results found.  PMFS History: Patient Active Problem List   Diagnosis Date Noted   Closed fracture of right distal radius 04/07/2021   Heme positive stool 08/06/2020   Hordeolum externum of right upper eyelid 08/06/2020   Right hand pain 11/15/2017   Dermatitis due to drug reaction 09/02/2017   Drug-induced erythroderma 09/02/2017   Laryngitis, acute 08/04/2017   Nonallopathic lesion of cervical region 04/06/2017   Nonallopathic lesion of thoracic region 04/06/2017   Nonallopathic lesion of lumbosacral region 04/06/2017   Cervical radiculopathy at C8 03/04/2017   Excessive cerumen in both ear canals 02/15/2017   Neuropathy, cervical plexus 02/15/2017   Poison ivy dermatitis 07/29/2015   Pharyngitis 09/26/2013   Back strain 06/11/2013   Healthcare maintenance 04/25/2011   Mixed hyperlipidemia 03/14/2010   Essential hypertension 03/14/2010   Past Medical History:  Diagnosis Date   Colon  polyps    Detached retina, right 48   Dr. [name] at [facility] did orbital band procedure with good results.    High cholesterol    Hypertension    Poison ivy dermatitis 07/29/2015    Family History  Problem Relation Age of Onset   Parkinsonism Mother    Colon polyps Father    Colon cancer Father 49   Cancer Father        colon cancer with mets   Hyperlipidemia Father    Hypertension Father    Hypertension Brother    Diabetes Neg Hx    Heart disease Neg Hx    Esophageal cancer Neg Hx    Rectal cancer Neg Hx    Stomach cancer Neg Hx     Past Surgical History:  Procedure Laterality Date   COLONOSCOPY  04/2007   Dr.Magod   COLONOSCOPY  09/06/2014   Dr.Magod   OPEN REDUCTION INTERNAL FIXATION (ORIF) DISTAL RADIAL FRACTURE Right 04/08/2021   Procedure: Right OPEN REDUCTION INTERNAL FIXATION (ORIF) DISTAL RADIAL FRACTURE;  Surgeon: [name], MD;  Location: [facility];  Service: Orthopedics;  Laterality: Right;   RETINAL DETACHMENT SURGERY     30 years ago   Social History   Occupational History   Occupation: busines    Employer: [employer]  Tobacco Use   Smoking status: Never   Smokeless tobacco: Never  Vaping Use   Vaping Use: Never used  Substance  and Sexual Activity   Alcohol use: Not Currently    Comment: occ beer   Drug use: No   Sexual activity: Yes    Partners: Female

## 2021-05-07 ENCOUNTER — Encounter: Payer: 59 | Admitting: Rehabilitative and Restorative Service Providers"

## 2021-05-08 ENCOUNTER — Encounter: Payer: 59 | Admitting: Rehabilitative and Restorative Service Providers"

## 2021-05-11 ENCOUNTER — Ambulatory Visit (INDEPENDENT_AMBULATORY_CARE_PROVIDER_SITE_OTHER): Payer: 59 | Admitting: Rehabilitative and Restorative Service Providers"

## 2021-05-11 ENCOUNTER — Encounter: Payer: Self-pay | Admitting: Rehabilitative and Restorative Service Providers"

## 2021-05-11 ENCOUNTER — Other Ambulatory Visit: Payer: Self-pay

## 2021-05-11 DIAGNOSIS — R278 Other lack of coordination: Secondary | ICD-10-CM

## 2021-05-11 DIAGNOSIS — M6281 Muscle weakness (generalized): Secondary | ICD-10-CM

## 2021-05-11 DIAGNOSIS — M25641 Stiffness of right hand, not elsewhere classified: Secondary | ICD-10-CM

## 2021-05-11 DIAGNOSIS — M79641 Pain in right hand: Secondary | ICD-10-CM

## 2021-05-11 DIAGNOSIS — R601 Generalized edema: Secondary | ICD-10-CM

## 2021-05-11 DIAGNOSIS — M25531 Pain in right wrist: Secondary | ICD-10-CM

## 2021-05-11 NOTE — Therapy (Signed)
OUTPATIENT OCCUPATIONAL THERAPY TREATMENT NOTE   Patient Name: Stephen [name]. MRN: [id number] DOB:Mar 31, 1960, 61 y.o., male 61 Date: 05/11/2021  PCP: [name], MD REFERRING PROVIDER: [name], MD   OT End of Session - 05/11/21 1304     Visit Number 5    Number of Visits 6    Date for OT Re-Evaluation 05/20/21    Authorization Type [employer]    Progress Note Due on Visit --   Every 10 th visit   OT Start Time 1304    OT Stop Time 1355    OT Time Calculation (min) 51 min    Equipment Utilized During Treatment --    Activity Tolerance Patient tolerated treatment well;No increased pain    Behavior During Therapy WFL for tasks assessed/performed                Past Medical History:  Diagnosis Date   Colon polyps    Detached retina, right 28   Dr. [name] at [facility] did orbital band procedure with good results.    High cholesterol    Hypertension    Poison ivy dermatitis 07/29/2015   Past Surgical History:  Procedure Laterality Date   COLONOSCOPY  04/2007   Dr.Magod   COLONOSCOPY  09/06/2014   Dr.Magod   OPEN REDUCTION INTERNAL FIXATION (ORIF) DISTAL RADIAL FRACTURE Right 04/08/2021   Procedure: Right OPEN REDUCTION INTERNAL FIXATION (ORIF) DISTAL RADIAL FRACTURE;  Surgeon: [name], MD;  Location: [place];  Service: Orthopedics;  Laterality: Right;   RETINAL DETACHMENT SURGERY     30 years ago   Patient Active Problem List   Diagnosis Date Noted   Closed fracture of right distal radius 04/07/2021   Heme positive stool 08/06/2020   Hordeolum externum of right upper eyelid 08/06/2020   Right hand pain 11/15/2017   Dermatitis due to drug reaction 09/02/2017   Drug-induced erythroderma 09/02/2017   Laryngitis, acute 08/04/2017   Nonallopathic lesion of cervical region 04/06/2017   Nonallopathic lesion of thoracic region 04/06/2017   Nonallopathic lesion of lumbosacral region 04/06/2017   Cervical radiculopathy at  C8 03/04/2017   Excessive cerumen in both ear canals 02/15/2017   Neuropathy, cervical plexus 02/15/2017   Poison ivy dermatitis 07/29/2015   Pharyngitis 09/26/2013   Back strain 06/11/2013   Healthcare maintenance 04/25/2011   Mixed hyperlipidemia 03/14/2010   Essential hypertension 03/14/2010    ONSET DATE: 04/09/2021   REFERRING DIAG: Other closed intra-articular fracture of distal end of right radius, initial encounter   THERAPY DIAG:  Right hand pain  Pain in right wrist  Other lack of coordination  Generalized edema  Muscle weakness (generalized)  Stiffness of right hand, not elsewhere classified   PERTINENT HISTORY:  From 04/22/21: X-rays from 04/20/21 demonstrate appropriate alignment of the fracture in both AP and lateral views per Dr Tempie Donning notes and chart review.  Pt reports numbness in digits 2-4. He is scheduled for nerve conduction study on 04/29/21. 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  PRECAUTIONS: ORIF bridge plate R distal radius ~4.5 weeks ago  SUBJECTIVE:  05/11/21: He states "doing something" over weekend and now when he now has soreness with should IR, and pronation movements in rt arm. Other than that, he states doing well.   05/04/21: Pt states doing well, no  pain, fingers seem to be moving better.    05/01/21: He states MD advised to hold on EMG, he is still numb today in Median Nerve distribution. He states doing HEP with no significant issues   04/27/21: Pt states fingers are less numb now, but tip of middle is still tingling and is also more stiff than the rest when he tries to close fist. He states doing HEP and having no issues with well fitting orthotic.     PAIN:  Are you having pain? No NPRS scale: 0/10 Pain location: no pain but still numb in median nerve  distribution Pain orientation: Right  PAIN TYPE: tingling Pain description: constant  Aggravating factors: none Relieving factors: none  OBJECTIVE:   DIAGNOSTIC FINDINGS: (From 04/22/21 Eval) Pt reports edema in right wrist/hand and lack of A/ROM in R digits. Pt is unable to make a fist and can oppose to his IF only at this time. He presented in his post op cast. His sutures were removed 04/20/21.   ADLs: Overall ADLs: Pt is getting PRN assist from wife and daughter for ADL's (lifting, carrying, opening etc...). 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   FUNCTIONAL OUTCOME MEASURES: Quick Dash Score    61% disability   SENSATION: New 05/11/21  Light touch: Impaired Semmes Weinstein Monofilament scale:  Right hand thumb = 2.83 (normal), IF= 3.61 (decrease light touch), MF = 4.31 decreased protective sense small = 2.83 (normal)    COORDINATION: (04/27/21) 9 Hole Peg test: Right: NT sec; Left: NT sec Box and Blocks: 45 Comments: performed in orthotic, falls > 2 standard deviations (53), coordination is Impaired.    EDEMA: much better now 04/27/21   MUSCLE TONE: WNL's     SKIN INTEGRITY: Surgical sites well healed, very small amount of eschar present still    PALPATION: non-tender to LT today (2//23)    UE A/ROM:   A/ROM Right 04/27/2021 Rt 05/11/21  Shoulder flexion Greatly improve WNL now   Shoulder abduction    Shoulder adduction    Shoulder extension     Shoulder internal rotation    Shoulder external rotation Greatly improve WNL now   Elbow flexion WNL with neutral pronation   Elbow extension WNL with neutral pronation   Wrist flexion NT   Wrist extension  NT   Wrist ulnar deviation NT    Wrist radial deviation  NT   Forearm pronation  Improving to ~80* now 81*  Forearm supination  Improving to ~40* now 50*  (Blank rows = not tested)   HAND A/ROM: (taking MF measures only as a sample of overall hand motion)    A/ROM  Right 05/01/2021 Right  05/04/21 Rt  05/11/21  Long MCP (0-90) 0-41  0-45 0-62*  Long PIP (0-100) 0-69  0-92 0-94*  Long DIP (0-70) 0-35 0-64 0-63*  (Blank rows = not tested)   UE MMT: (not tested until resistance appropriate)    MMT Right 04/22/2021 Left 04/22/2021  Shoulder flexion    Shoulder abduction      Shoulder adduction      Shoulder extension      Shoulder internal rotation      Shoulder external rotation      Middle trapezius      Lower trapezius      Elbow flexion      Elbow extension      Wrist flexion      Wrist extension  Wrist ulnar deviation      Wrist radial deviation      Wrist pronation      Wrist supination      (Blank rows = not tested)   HAND FUNCTION:   Grip strength: Right: NT lbs; Left: NT lbs Lateral pinch: Right: NT lbs, Left: NT lbs 3 point pinch: Right: NT lbs, Left: NT lbs Comments: Deferred   TODAY'S TREATMENT:   05/11/21: Reviews below HEP with pt for A/ROM and light P/ROM in rt arm (no P/ROM at wrist until plate is out). He tolerates well, also OT manual therapy over sore area and feels better but is advised to stop stretching in pronation. He states understanding. OT also adds self stretches for composite thumb flexion, and educates on self-care activities that he should be attempting as tolerated like brushing hair, eating cereal, folding laundry, etc. And he states understanding & will attempt. Also reviews and performs FMS/in-hand manipulations.   Exercises Median Nerve Glide - 2-3 x daily - 10 reps Median Nerve Flossing - Tray - 2-3 x daily - 10 reps Median Nerve Glide - 2-3 x daily - 10 reps Standing Forearm Pronation and Supination AROM - 4-6 x daily - 10-15 reps Forearm Supination Stretch - 2-3 x daily - 3-5 reps - 15 sec hold Wrist AROM Radial Ulnar Deviation - 4-6 x daily - 1 sets - 10-15 reps Wrist AROM Flexion Extension - 4-6 x daily - 10-15 reps Tendon Glides - 3-4 x daily - 5- 10 reps - 2-3 seconds hold Thumb Opposition -  2-3 x daily - 10 reps Seated Finger Composite Flexion Stretch - 4-6 x daily - 3-5 reps - 15 hold Hand PROM MCP Flexion - 4-6 x daily - 3-5 reps - 15 hold   05/04/21: Pt reviews HEP and performs A/ROM at [facility] and wrist concurrent and overlap with 7 mins in fluidotherapy. OT performs manual stretches for FA in pro/sup also at fingers in composite flexion and intrinsic minus positions. Measures taken "cold" today were much better than post-visit measures last session! Pt making excellent progress. Lastly, he learns and performs median nerve glides to help address numbness- he states no pain but does have changes in his baseline tingling during. Add to help 2-3xday gently. Pt also provided with edema glove to help with swelling at night and coban wrap to perform passive stretch at fingers (is edu how to do).    05/01/21: OT reviews HEP with pt who performs FA A/ROM, tendon glides, and opposition concurrently with fluidotherapy for heat and pain relief.  Afterwards, OT performs light manual P/ROM and stretches to FA in pro/sup, fingers at MCPs and in intrinsic minus position. Retrograde massage done as well with pt edu to add to HEP.  OT measures MF A/ROM for status check (will be compared in next sessions), but overall he is moving more, less guarded today. Lastly, he is edu in functional fine motor and coordination activities with a writing utensil & performs, dropping pen at times but making slow improvements over time in rotation, translation, shift and other in-hand manipulation skills. These were added to HEP as well.       PATIENT EDUCATION: Education details: HEP, splinting use, care and precautions. Person educated: Patient Education method: Explanation, Demonstration, Verbal cues, and Handouts Education comprehension: verbalized understanding, returned demonstration, and needs further education     HOME EXERCISE PROGRAM: See treatment section above [id number]   ASSESSMENT:   CLINICAL  IMPRESSION: 05/11/21: Super encouraging that his  motion continues to improve and now his sensation is much improved since last week!  05/04/21:Great AROM progress in fingers now, hopefully nerve glides help sensation. We will back down to 1/week now that he knows HEP and is managing well, current POC will have reassessment in 2 more visits, then likely be put on hold until bridge plate removed.   05/01/21: improving motion, pain low/non-existent, numbness in median nerve dist still dense, but will be tracked.  Will add nerve glides as well    04/27/21: Improving motion, sensation and functional ability. Keep on POC    From 04/22/21: Patient is a 61 y.o. R HD male who was seen today for occupational therapy evaluation and treatment for right distal radius bridge plating two weeks ago s/p fracture. He was also referred for edema control and finger ROM . Patient has performance deficits in functional skills including ADLs, coordination, dexterity, edema, ROM, strength, pain, flexibility, FMC, decreased knowledge of precautions, and UE functional use. These impairments are limiting patient from ADLs, work, and leisure. Patient may have co-morbidities  that affects occupational performance. Patient will benefit from skilled OT to address above impairments and improve overall function.     GOALS: Goals reviewed with patient? No   SHORT TERM GOALS: (STG required if POC>30 days)   STG Name Target Date Goal status  1 Pt will be Mod I splinting use, care and precautions R wrist/forearm Baseline:  05/13/2021 MET  2 Pt will be Mod I initial HEP R UE as seen by ability to perform in clinic Baseline:  05/13/2021 MET and Upgrading HEP  3 Pt will be Mod I edema control techniques as seen by ability to state 1-2 techniques w/o vc's Baseline:  05/13/2021 INITIAL    LONG TERM GOALS:    LTG Name Target Date Goal status  1 Pt will be Mod I updated HEP for R UE Baseline:  06/03/2021 Progressing  2 Pt will be Mod I  scar management and desensitization R forearm/wrist as observed in clinic Baseline:  06/03/2021 Progressing  3 Pt will demonstrate improved A/ROM right hand as seen by ability to make a flat fist or better  Baseline:  06/03/2021 Progressing  4 Pt will demonstrate improved grip strength R UE as seen by ability to grip 20# or greater via JAMAR dynamometer Baseline:  06/03/2021 Progressing  5 Update and progress all goals as appropriate after hardware removal    TBD   PLAN: OT FREQUENCY: 1-2x/week   OT DURATION: 6 weeks   PLANNED INTERVENTIONS: self care/ADL training, therapeutic exercise, therapeutic activity, scar mobilization, passive range of motion, splinting, ultrasound, fluidotherapy, patient/family education, and DME and/or AE instructions   PLAN FOR NEXT SESSION:  Reassess, put plan on hold until plate is out, most likely.     RECOMMENDED OTHER SERVICES: N/A   CONSULTED AND AGREED WITH PLAN OF CARE: Patient    Benito Mccreedy, OTR/L, CHT 05/11/2021, 2:17 PM

## 2021-05-13 ENCOUNTER — Encounter: Payer: 59 | Admitting: Rehabilitative and Restorative Service Providers"

## 2021-05-14 ENCOUNTER — Other Ambulatory Visit (HOSPITAL_BASED_OUTPATIENT_CLINIC_OR_DEPARTMENT_OTHER): Payer: Self-pay

## 2021-05-18 ENCOUNTER — Encounter: Payer: 59 | Admitting: Rehabilitative and Restorative Service Providers"

## 2021-05-19 ENCOUNTER — Other Ambulatory Visit: Payer: Self-pay

## 2021-05-19 ENCOUNTER — Ambulatory Visit: Payer: Self-pay

## 2021-05-19 ENCOUNTER — Ambulatory Visit: Payer: 59 | Admitting: Orthopedic Surgery

## 2021-05-19 ENCOUNTER — Ambulatory Visit (INDEPENDENT_AMBULATORY_CARE_PROVIDER_SITE_OTHER): Payer: 59 | Admitting: Orthopedic Surgery

## 2021-05-19 ENCOUNTER — Ambulatory Visit (INDEPENDENT_AMBULATORY_CARE_PROVIDER_SITE_OTHER): Payer: 59 | Admitting: Rehabilitative and Restorative Service Providers"

## 2021-05-19 DIAGNOSIS — M79641 Pain in right hand: Secondary | ICD-10-CM

## 2021-05-19 DIAGNOSIS — M25641 Stiffness of right hand, not elsewhere classified: Secondary | ICD-10-CM

## 2021-05-19 DIAGNOSIS — M6281 Muscle weakness (generalized): Secondary | ICD-10-CM

## 2021-05-19 DIAGNOSIS — R278 Other lack of coordination: Secondary | ICD-10-CM

## 2021-05-19 DIAGNOSIS — S52571A Other intraarticular fracture of lower end of right radius, initial encounter for closed fracture: Secondary | ICD-10-CM

## 2021-05-19 DIAGNOSIS — R601 Generalized edema: Secondary | ICD-10-CM

## 2021-05-19 DIAGNOSIS — M25531 Pain in right wrist: Secondary | ICD-10-CM | POA: Diagnosis not present

## 2021-05-19 NOTE — Progress Notes (Signed)
Post-Op Visit Note   Patient: Stephen [name].           Date of Birth: [birth year]           MRN: [id number] Visit Date: 05/19/2021 PCP: [name], MD   Assessment & Plan:  Chief Complaint:  Chief Complaint  Patient presents with   Right Wrist - Routine Post Op   Visit Diagnoses:  1. Other closed intra-articular fracture of distal end of right radius, initial encounter     Plan: Patient is now 6 weeks s/p dorsal spanning bridge plate for a severely comminuted right distal radius fracture.  He has been in therapy to work on finger ROM and has made great progress.  He was given a home exercise program.  His middle finger numbness is improving.  We discussed removing the plate in another 4-6 weeks.  X-rays show maintained fracture alignment.  I'll see him back in another month with repeat x-rays and will hopefully schedule hardware removal at that time.   Follow-Up Instructions: No follow-ups on file.   Orders:  Orders Placed This Encounter  Procedures   XR Wrist Complete Right   No orders of the defined types were placed in this encounter.   Imaging: No results found.  PMFS History: Patient Active Problem List   Diagnosis Date Noted   Closed fracture of right distal radius 04/07/2021   Heme positive stool 08/06/2020   Hordeolum externum of right upper eyelid 08/06/2020   Right hand pain 11/15/2017   Dermatitis due to drug reaction 09/02/2017   Drug-induced erythroderma 09/02/2017   Laryngitis, acute 08/04/2017   Nonallopathic lesion of cervical region 04/06/2017   Nonallopathic lesion of thoracic region 04/06/2017   Nonallopathic lesion of lumbosacral region 04/06/2017   Cervical radiculopathy at C8 03/04/2017   Excessive cerumen in both ear canals 02/15/2017   Neuropathy, cervical plexus 02/15/2017   Poison ivy dermatitis 07/29/2015   Pharyngitis 09/26/2013   Back strain 06/11/2013   Healthcare maintenance 04/25/2011   Mixed hyperlipidemia  03/14/2010   Essential hypertension 03/14/2010   Past Medical History:  Diagnosis Date   Colon polyps    Detached retina, right 12   Dr. [name] at [facility] did orbital band procedure with good results.    High cholesterol    Hypertension    Poison ivy dermatitis 07/29/2015    Family History  Problem Relation Age of Onset   Parkinsonism Mother    Colon polyps Father    Colon cancer Father 1   Cancer Father        colon cancer with mets   Hyperlipidemia Father    Hypertension Father    Hypertension Brother    Diabetes Neg Hx    Heart disease Neg Hx    Esophageal cancer Neg Hx    Rectal cancer Neg Hx    Stomach cancer Neg Hx     Past Surgical History:  Procedure Laterality Date   COLONOSCOPY  04/2007   Dr.Magod   COLONOSCOPY  09/06/2014   Dr.Magod   OPEN REDUCTION INTERNAL FIXATION (ORIF) DISTAL RADIAL FRACTURE Right 04/08/2021   Procedure: Right OPEN REDUCTION INTERNAL FIXATION (ORIF) DISTAL RADIAL FRACTURE;  Surgeon: [name], MD;  Location: [facility];  Service: Orthopedics;  Laterality: Right;   RETINAL DETACHMENT SURGERY     30 years ago   Social History   Occupational History   Occupation: busines    Employer: [employer]  Tobacco Use   Smoking status: Never  Smokeless tobacco: Never  Vaping Use   Vaping Use: Never used  Substance and Sexual Activity   Alcohol use: Not Currently    Comment: occ beer   Drug use: No   Sexual activity: Yes    Partners: Female

## 2021-05-19 NOTE — Therapy (Signed)
OUTPATIENT OCCUPATIONAL THERAPY TREATMENT & PROGRESS NOTE   Patient Name: Stephen [name]. MRN: [id number] DOB:[birth year], 61 y.o., male 51 Date: 05/19/2021  PCP: [name], MD REFERRING PROVIDER: [name], MD  Progress Note  Reporting Period 04/22/21 to 05/20/21.  See note below for Objective Data and Assessment of Progress/Goals.    OT End of Session - 05/19/21 0928     Visit Number 6    Number of Visits 24    Date for OT Re-Evaluation 08/21/21    Authorization Type [employer]    Progress Note Due on Visit --   Every 10 th visit   OT Start Time [phone]    OT Stop Time 1012    OT Time Calculation (min) 43 min    Activity Tolerance Patient tolerated treatment well;No increased pain    Behavior During Therapy WFL for tasks assessed/performed               Past Medical History:  Diagnosis Date   Colon polyps    Detached retina, right 55   Dr. [name] at [facility] did orbital band procedure with good results.    High cholesterol    Hypertension    Poison ivy dermatitis 07/29/2015   Past Surgical History:  Procedure Laterality Date   COLONOSCOPY  04/2007   Dr.Magod   COLONOSCOPY  09/06/2014   Dr.Magod   OPEN REDUCTION INTERNAL FIXATION (ORIF) DISTAL RADIAL FRACTURE Right 04/08/2021   Procedure: Right OPEN REDUCTION INTERNAL FIXATION (ORIF) DISTAL RADIAL FRACTURE;  Surgeon: [name], MD;  Location: [place];  Service: Orthopedics;  Laterality: Right;   RETINAL DETACHMENT SURGERY     30 years ago   Patient Active Problem List   Diagnosis Date Noted   Closed fracture of right distal radius 04/07/2021   Heme positive stool 08/06/2020   Hordeolum externum of right upper eyelid 08/06/2020   Right hand pain 11/15/2017   Dermatitis due to drug reaction 09/02/2017   Drug-induced erythroderma 09/02/2017   Laryngitis, acute 08/04/2017   Nonallopathic lesion of cervical region 04/06/2017   Nonallopathic lesion of thoracic region  04/06/2017   Nonallopathic lesion of lumbosacral region 04/06/2017   Cervical radiculopathy at C8 03/04/2017   Excessive cerumen in both ear canals 02/15/2017   Neuropathy, cervical plexus 02/15/2017   Poison ivy dermatitis 07/29/2015   Pharyngitis 09/26/2013   Back strain 06/11/2013   Healthcare maintenance 04/25/2011   Mixed hyperlipidemia 03/14/2010   Essential hypertension 03/14/2010    ONSET DATE: 04/08/2021 DOS   REFERRING DIAG: Other closed intra-articular fracture of distal end of right radius, initial encounter   THERAPY DIAG:  Pain in right wrist  Right hand pain  Muscle weakness (generalized)  Generalized edema  Other lack of coordination  Stiffness of right hand, not elsewhere classified   PERTINENT HISTORY:  From 04/22/21: X-rays from 04/20/21 demonstrate appropriate alignment of the fracture in both AP and lateral views per Dr Tempie Donning notes and chart review.  Pt reports numbness in digits 2-4. He is scheduled for nerve conduction study on 04/29/21. Pt reports that he has been seeing Dr Tamala Julian in 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  PRECAUTIONS: ORIF bridge plate R distal radius 6 weeks ago  SUBJECTIVE:  05/19/21: He states nothing new or exciting since last seen, no pain, etc. Doing HEP.    PAIN:  Are you having pain?  No NPRS scale: 0/10 Pain location: no pain but still numb in median nerve distribution Pain orientation: Right  PAIN TYPE: tingling Pain description: constant  Aggravating factors: none Relieving factors: none  OBJECTIVE:  UPDATED 05/19/21    ADLs: Overall ADLs: Pt is now driving, going to work opening containers, etc., still has mild-moderate problems with higher level tasks however   FUNCTIONAL OUTCOME MEASURES: Quick Dash Score 27% disability now (down from 61%)    SENSATION:  Light touch: Impaired Semmes  Weinstein Monofilament scale:  Right hand = 2.83 (normal) in ulnar and median now!    COORDINATION: 9 Hole Peg test: Right: NT sec; Left: NT sec Box and Blocks: 55 (was 45) Comments: now within 2 SD's (53) and is WNL,brace not needed today for performance   EDEMA: mild diffuse swelling about hand still but much better now   MUSCLE TONE: WNL's     SKIN INTEGRITY: Surgical sites well healed, not sensitive   PALPATION: no significant issue now   UE A/ROM:   A/ROM Right 04/27/2021 Rt 05/11/21 Rt 05/19/21   Shoulder flexion Greatly improve WNL now    Shoulder abduction     Shoulder adduction     Shoulder extension      Shoulder internal rotation     Shoulder external rotation Greatly improve WNL now    Elbow flexion WNL with neutral pronation  WNL  Elbow extension WNL with neutral pronation  WNL  Wrist flexion NT  15*  Wrist extension  NT  (-8*)  Wrist ulnar deviation NT     Wrist radial deviation  NT    Forearm pronation  Improving to ~80* now 81* 73  Forearm supination  Improving to ~40* now 50* 45  (Blank rows = not tested)   HAND A/ROM: (taking MF measures only as a sample of overall hand motion)    A/ROM Right 05/01/2021 Right  05/04/21 Rt  05/11/21 Rt  05/19/21  Long MCP (0-90) 0-41  0-45 0-62* 0-60*  Long PIP (0-100) 0-69  0-92 0-94* 0-99*  Long DIP (0-70) 0-35 0-64 0-63* 0-65*  (Blank rows = not tested)   UE MMT: (not tested until resistance appropriate)    MMT Right 04/22/2021 Left 04/22/2021  Shoulder flexion    Shoulder abduction      Shoulder adduction      Shoulder extension      Shoulder internal rotation      Shoulder external rotation      Middle trapezius      Lower trapezius      Elbow flexion      Elbow extension      Wrist flexion      Wrist extension      Wrist ulnar deviation      Wrist radial deviation      Wrist pronation      Wrist supination      (Blank rows = not tested)   HAND FUNCTION:   Grip strength: Right: 12 lbs tender; Left:  104 lbs Comments: 05/19/21 initial testing   TODAY'S TREATMENT:   05/19/21: OT reviews HEP with pt who also performs A/ROM and gripping for reassessment. He tolerates new isometric gripping well, no lingering pain. This is added to HEP to be done while awaiting next sx. Sensation much improved and he states no scar sensitivity- reviews nerve glides. OT also reviews edema control, home/functional activities and limitations through Quick DASH discussion. Overall he is doing much better with much less disability.  05/11/21: Reviews below HEP with pt for A/ROM and light P/ROM in rt arm (no P/ROM at wrist until plate is out). He tolerates well, also OT manual therapy over sore area and feels better but is advised to stop stretching in pronation. He states understanding. OT also adds self stretches for composite thumb flexion, and educates on self-care activities that he should be attempting as tolerated like brushing hair, eating cereal, folding laundry, etc. And he states understanding & will attempt. Also reviews and performs FMS/in-hand manipulations.   Exercises Median Nerve Glide - 2-3 x daily - 10 reps Median Nerve Flossing - Tray - 2-3 x daily - 10 reps Median Nerve Glide - 2-3 x daily - 10 reps Standing Forearm Pronation and Supination AROM - 4-6 x daily - 10-15 reps Forearm Supination Stretch - 2-3 x daily - 3-5 reps - 15 sec hold Wrist AROM Radial Ulnar Deviation - 4-6 x daily - 1 sets - 10-15 reps Wrist AROM Flexion Extension - 4-6 x daily - 10-15 reps Tendon Glides - 3-4 x daily - 5- 10 reps - 2-3 seconds hold Thumb Opposition - 2-3 x daily - 10 reps Seated Finger Composite Flexion Stretch - 4-6 x daily - 3-5 reps - 15 hold Hand PROM MCP Flexion - 4-6 x daily - 3-5 reps - 15 hold     PATIENT EDUCATION: Education details: HEP, splinting use, care and precautions. Person educated: Patient Education method: Explanation, Demonstration, Verbal cues, and Handouts Education comprehension:  verbalized understanding, returned demonstration, and needs further education     HOME EXERCISE PROGRAM: See treatment section above [id number]   ASSESSMENT:   CLINICAL IMPRESSION: 05/19/21: Due to pt understanding HEP, meeting all current goals, having no significant issues, he will be put on hold for OT until spanning bridge plate removed (1-2 more months), then return for re-evaluation and continued therapy as needed. OT will plan for 8 weeks of therapy post-op, per typical protocols.     GOALS: Goals reviewed with patient? No   SHORT TERM GOALS: (STG required if POC>30 days)   STG Name Target Date Goal status  1 Pt will be Mod I splinting use, care and precautions R wrist/forearm Baseline:  05/13/2021 MET  2 Pt will be Mod I initial HEP R UE as seen by ability to perform in clinic Baseline:  05/13/2021 MET and Upgrading HEP  3 Pt will be Mod I edema control techniques as seen by ability to state 1-2 techniques w/o vc's Baseline:  05/13/2021 MET    LONG TERM GOALS:    LTG Name Target Date Goal status  1 Pt will be Mod I updated HEP for R UE Baseline:  06/03/2021 MET  2 Pt will be Mod I scar management and desensitization R forearm/wrist as observed in clinic Baseline:  06/03/2021 Progressing  3 Pt will demonstrate improved A/ROM right hand as seen by ability to make a flat fist or better  Baseline:  06/03/2021 Partially met- MP J s still tight   4 Pt will demonstrate improved grip strength R UE as seen by ability to grip 20# or greater via JAMAR dynamometer Baseline:  06/03/2021 Progressing, tolerates 12# grip today  5 Update and progress all goals as appropriate after hardware removal    TBD post hardware removal  PLAN: OT FREQUENCY: 1-2x/week (up to 16 visits)    OT DURATION: 8 weeks (period begins after bridge removal sx in 1-2 months)   PLANNED INTERVENTIONS: self care/ADL training, therapeutic exercise, therapeutic activity, scar  mobilization, passive range of motion,  splinting, ultrasound, fluidotherapy, patient/family education, and DME and/or AE instructions   PLAN FOR NEXT SESSION:  Pt on hold until re-evaluation post-op bridge removal in 1-2 months, then will resume for 8 more weeks after that.    RECOMMENDED OTHER SERVICES: N/A   CONSULTED AND AGREED WITH PLAN OF CARE: Patient   Benito Mccreedy, OTR/L, CHT 05/19/2021, 10:24 AM

## 2021-05-20 ENCOUNTER — Encounter: Payer: 59 | Admitting: Rehabilitative and Restorative Service Providers"

## 2021-06-16 ENCOUNTER — Ambulatory Visit: Payer: 59 | Admitting: Orthopedic Surgery

## 2021-06-16 ENCOUNTER — Ambulatory Visit: Payer: Self-pay

## 2021-06-16 ENCOUNTER — Other Ambulatory Visit: Payer: Self-pay

## 2021-06-16 DIAGNOSIS — S52571A Other intraarticular fracture of lower end of right radius, initial encounter for closed fracture: Secondary | ICD-10-CM

## 2021-06-16 NOTE — Progress Notes (Signed)
? ?Post-Op Visit Note ?  ?Patient: Stephen [name].           ?Date of Birth: 09-28-1960           ?MRN: [id number] ?Visit Date: 06/16/2021 ?PCP: [name], MD ? ? ?Assessment & Plan: ? ?Chief Complaint:  ?Chief Complaint  ?Patient presents with  ? Right Wrist - Routine Post Op, Follow-up, Fracture  ? ?Visit Diagnoses:  ?1. Other closed intra-articular fracture of distal end of right radius, initial encounter   ? ? ?Plan: Patient is now 10 weeks out from the injury.  There has been some evidence of fracture healing on x-ray but with some continued lucency at the ulnar fragment.  Will plan on getting a CT to better evaluate the fracture healing.  If sufficient healing then will plan on removing the plate in 2-3 weeks.  He is able to make a strong fist after postoperative hand therapy.  Discussed the role of continued therapy after plate removal to regain wrist and forearm ROM.  ? ?Follow-Up Instructions: No follow-ups on file.  ? ?Orders:  ?Orders Placed This Encounter  ?Procedures  ? XR Wrist Complete Right  ? CT WRIST RIGHT WO CONTRAST  ? ?No orders of the defined types were placed in this encounter. ? ? ?Imaging: ?No results found. ? ?PMFS History: ?Patient Active Problem List  ? Diagnosis Date Noted  ? Closed fracture of right distal radius 04/07/2021  ? Heme positive stool 08/06/2020  ? Hordeolum externum of right upper eyelid 08/06/2020  ? Right hand pain 11/15/2017  ? Dermatitis due to drug reaction 09/02/2017  ? Drug-induced erythroderma 09/02/2017  ? Laryngitis, acute 08/04/2017  ? Nonallopathic lesion of cervical region 04/06/2017  ? Nonallopathic lesion of thoracic region 04/06/2017  ? Nonallopathic lesion of lumbosacral region 04/06/2017  ? Cervical radiculopathy at C8 03/04/2017  ? Excessive cerumen in both ear canals 02/15/2017  ? Neuropathy, cervical plexus 02/15/2017  ? Poison ivy dermatitis 07/29/2015  ? Pharyngitis 09/26/2013  ? Back strain 06/11/2013  ? Healthcare maintenance  04/25/2011  ? Mixed hyperlipidemia 03/14/2010  ? Essential hypertension 03/14/2010  ? ?Past Medical History:  ?Diagnosis Date  ? Colon polyps   ? Detached retina, right 1992  ? Dr. [name] at [facility] did orbital band procedure with good results.   ? High cholesterol   ? Hypertension   ? Poison ivy dermatitis 07/29/2015  ?  ?Family History  ?Problem Relation Age of Onset  ? Parkinsonism Mother   ? Colon polyps Father   ? Colon cancer Father 22  ? Cancer Father   ?     colon cancer with mets  ? Hyperlipidemia Father   ? Hypertension Father   ? Hypertension Brother   ? Diabetes Neg Hx   ? Heart disease Neg Hx   ? Esophageal cancer Neg Hx   ? Rectal cancer Neg Hx   ? Stomach cancer Neg Hx   ?  ?Past Surgical History:  ?Procedure Laterality Date  ? COLONOSCOPY  04/2007  ? Dr.Magod  ? COLONOSCOPY  09/06/2014  ? Dr.Magod  ? OPEN REDUCTION INTERNAL FIXATION (ORIF) DISTAL RADIAL FRACTURE Right 04/08/2021  ? Procedure: Right OPEN REDUCTION INTERNAL FIXATION (ORIF) DISTAL RADIAL FRACTURE;  Surgeon: [name], MD;  Location: [place];  Service: Orthopedics;  Laterality: Right;  ? RETINAL DETACHMENT SURGERY    ? 30 years ago  ? ?Social History  ? ?Occupational History  ? Occupation: busines  ?  Employer: [employer]  ?  Tobacco Use  ? Smoking status: Never  ? Smokeless tobacco: Never  ?Vaping Use  ? Vaping Use: Never used  ?Substance and Sexual Activity  ? Alcohol use: Not Currently  ?  Comment: occ beer  ? Drug use: No  ? Sexual activity: Yes  ?  Partners: Female  ? ? ? ?

## 2021-06-18 ENCOUNTER — Ambulatory Visit
Admission: RE | Admit: 2021-06-18 | Discharge: 2021-06-18 | Disposition: A | Discharge status: Home / Self Care | Payer: 59 | Source: Ambulatory Visit | Attending: Orthopedic Surgery | Admitting: Orthopedic Surgery

## 2021-06-18 DIAGNOSIS — S52571A Other intraarticular fracture of lower end of right radius, initial encounter for closed fracture: Secondary | ICD-10-CM

## 2021-06-18 DIAGNOSIS — S52501A Unspecified fracture of the lower end of right radius, initial encounter for closed fracture: Secondary | ICD-10-CM | POA: Diagnosis not present

## 2021-06-30 ENCOUNTER — Telehealth: Payer: Self-pay | Admitting: Orthopedic Surgery

## 2021-06-30 NOTE — Telephone Encounter (Signed)
Pt is calling to advise that CT scan done 10 days ago, pt wants to know what the next steps are . ?

## 2021-07-28 ENCOUNTER — Encounter: Payer: Self-pay | Admitting: Orthopedic Surgery

## 2021-07-28 ENCOUNTER — Ambulatory Visit (INDEPENDENT_AMBULATORY_CARE_PROVIDER_SITE_OTHER): Payer: 59

## 2021-07-28 ENCOUNTER — Ambulatory Visit: Payer: 59 | Admitting: Orthopedic Surgery

## 2021-07-28 DIAGNOSIS — S52571A Other intraarticular fracture of lower end of right radius, initial encounter for closed fracture: Secondary | ICD-10-CM

## 2021-07-28 NOTE — Progress Notes (Signed)
? ?Office Visit Note ?  ?Patient: Stephen [name].           ?Date of Birth: [birth year]           ?MRN: [id number] ?Visit Date: 07/28/2021 ?             ?Requested by: [name], MD ?[address] ?[place],  [place] [address] ?PCP: [name], MD ? ? ?Assessment & Plan: ?Visit Diagnoses:  ?1. Other closed intra-articular fracture of distal end of right radius, initial encounter   ? ? ?Plan: Patient is now 16 weeks status post ORIF of right distal radius fracture with bridge plate fixation secondary to severe comminution.  At the 2-week mark CT was obtained which showed incomplete healing of the fracture.  Repeat x-rays today show significant interval healing from that time.  I like to get a repeat CT just to be sure.  Discussed with patient that we can likely remove the plate next week as well as the CT shows continued interval healing.  He will call the office once the CT is complete. ? ?Follow-Up Instructions: No follow-ups on file.  ? ?Orders:  ?Orders Placed This Encounter  ?Procedures  ? XR Wrist Complete Right  ? CT WRIST RIGHT WO CONTRAST  ? ?No orders of the defined types were placed in this encounter. ? ? ? ? Procedures: ?No procedures performed ? ? ?Clinical Data: ?No additional findings. ? ? ?Subjective: ?Chief Complaint  ?Patient presents with  ? Right Wrist - Routine Post Op  ? ? ?Is a 61 year old right-hand-dominant male who is 16 weeks status post bridge plate fixation of a severely comminuted right distal radius fracture.  He has no pain today.  He has full and painless range of motion of his fingers today.  He is anxious to have the plate removed. ? ? ?Review of Systems ? ? ?Objective: ?Vital Signs: There were no vitals taken for this visit. ? ?Physical Exam ?Constitutional:   ?   Appearance: Normal appearance.  ?Cardiovascular:  ?   Rate and Rhythm: Normal rate.  ?   Pulses: Normal pulses.  ?Skin: ?   General: Skin is warm and dry.  ?   Capillary Refill: Capillary  refill takes less than 2 seconds.  ?Neurological:  ?   Mental Status: He is alert.  ? ? ?Right Hand Exam  ? ?Tenderness  ?The patient is experiencing no tenderness.  ? ?Other  ?Erythema: absent ?Sensation: normal ?Pulse: present ? ?Comments:  Full and painless finger ROM ? ? ? ? ?Specialty Comments:  ?No specialty comments available. ? ?Imaging: ?No results found. ? ? ?PMFS History: ?Patient Active Problem List  ? Diagnosis Date Noted  ? Closed fracture of right distal radius 04/07/2021  ? Heme positive stool 08/06/2020  ? Hordeolum externum of right upper eyelid 08/06/2020  ? Right hand pain 11/15/2017  ? Dermatitis due to drug reaction 09/02/2017  ? Drug-induced erythroderma 09/02/2017  ? Laryngitis, acute 08/04/2017  ? Nonallopathic lesion of cervical region 04/06/2017  ? Nonallopathic lesion of thoracic region 04/06/2017  ? Nonallopathic lesion of lumbosacral region 04/06/2017  ? Cervical radiculopathy at C8 03/04/2017  ? Excessive cerumen in both ear canals 02/15/2017  ? Neuropathy, cervical plexus 02/15/2017  ? Poison ivy dermatitis 07/29/2015  ? Pharyngitis 09/26/2013  ? Back strain 06/11/2013  ? Healthcare maintenance 04/25/2011  ? Mixed hyperlipidemia 03/14/2010  ? Essential hypertension 03/14/2010  ? ?Past Medical History:  ?Diagnosis Date  ? Colon polyps   ?  Detached retina, right 1992  ? Dr. [name] at [facility] did orbital band procedure with good results.   ? High cholesterol   ? Hypertension   ? Poison ivy dermatitis 07/29/2015  ?  ?Family History  ?Problem Relation Age of Onset  ? Parkinsonism Mother   ? Colon polyps Father   ? Colon cancer Father 42  ? Cancer Father   ?     colon cancer with mets  ? Hyperlipidemia Father   ? Hypertension Father   ? Hypertension Brother   ? Diabetes Neg Hx   ? Heart disease Neg Hx   ? Esophageal cancer Neg Hx   ? Rectal cancer Neg Hx   ? Stomach cancer Neg Hx   ?  ?Past Surgical History:  ?Procedure Laterality Date  ? COLONOSCOPY  04/2007  ? Dr.Magod  ? COLONOSCOPY   09/06/2014  ? Dr.Magod  ? OPEN REDUCTION INTERNAL FIXATION (ORIF) DISTAL RADIAL FRACTURE Right 04/08/2021  ? Procedure: Right OPEN REDUCTION INTERNAL FIXATION (ORIF) DISTAL RADIAL FRACTURE;  Surgeon: [name], MD;  Location: [place];  Service: Orthopedics;  Laterality: Right;  ? RETINAL DETACHMENT SURGERY    ? 30 years ago  ? ?Social History  ? ?Occupational History  ? Occupation: busines  ?  Employer: [employer]  ?Tobacco Use  ? Smoking status: Never  ? Smokeless tobacco: Never  ?Vaping Use  ? Vaping Use: Never used  ?Substance and Sexual Activity  ? Alcohol use: Not Currently  ?  Comment: occ beer  ? Drug use: No  ? Sexual activity: Yes  ?  Partners: Female  ? ? ? ? ? ? ?

## 2021-07-28 NOTE — H&P (View-Only) (Signed)
Office Visit Note   Patient: Stephen [name].           Date of Birth: [birth year]           MRN: [id number] Visit Date: 07/28/2021              Requested by: [name], MD [address] [place],  [place] [address] PCP: [name], MD   Assessment & Plan: Visit Diagnoses:  1. Other closed intra-articular fracture of distal end of right radius, initial encounter     Plan: Patient is now 16 weeks status post ORIF of right distal radius fracture with bridge plate fixation secondary to severe comminution.  At the 2-week mark CT was obtained which showed incomplete healing of the fracture.  Repeat x-rays today show significant interval healing from that time.  I like to get a repeat CT just to be sure.  Discussed with patient that we can likely remove the plate next week as well as the CT shows continued interval healing.  He will call the office once the CT is complete.  Follow-Up Instructions: No follow-ups on file.   Orders:  Orders Placed This Encounter  Procedures   XR Wrist Complete Right   CT WRIST RIGHT WO CONTRAST   No orders of the defined types were placed in this encounter.     Procedures: No procedures performed   Clinical Data: No additional findings.   Subjective: Chief Complaint  Patient presents with   Right Wrist - Routine Post Op    Is a 61 year old right-hand-dominant male who is 16 weeks status post bridge plate fixation of a severely comminuted right distal radius fracture.  He has no pain today.  He has full and painless range of motion of his fingers today.  He is anxious to have the plate removed.   Review of Systems   Objective: Vital Signs: There were no vitals taken for this visit.  Physical Exam Constitutional:      Appearance: Normal appearance.  Cardiovascular:     Rate and Rhythm: Normal rate.     Pulses: Normal pulses.  Skin:    General: Skin is warm and dry.     Capillary Refill: Capillary  refill takes less than 2 seconds.  Neurological:     Mental Status: He is alert.    Right Hand Exam   Tenderness  The patient is experiencing no tenderness.   Other  Erythema: absent Sensation: normal Pulse: present  Comments:  Full and painless finger ROM     Specialty Comments:  No specialty comments available.  Imaging: No results found.   PMFS History: Patient Active Problem List   Diagnosis Date Noted   Closed fracture of right distal radius 04/07/2021   Heme positive stool 08/06/2020   Hordeolum externum of right upper eyelid 08/06/2020   Right hand pain 11/15/2017   Dermatitis due to drug reaction 09/02/2017   Drug-induced erythroderma 09/02/2017   Laryngitis, acute 08/04/2017   Nonallopathic lesion of cervical region 04/06/2017   Nonallopathic lesion of thoracic region 04/06/2017   Nonallopathic lesion of lumbosacral region 04/06/2017   Cervical radiculopathy at C8 03/04/2017   Excessive cerumen in both ear canals 02/15/2017   Neuropathy, cervical plexus 02/15/2017   Poison ivy dermatitis 07/29/2015   Pharyngitis 09/26/2013   Back strain 06/11/2013   Healthcare maintenance 04/25/2011   Mixed hyperlipidemia 03/14/2010   Essential hypertension 03/14/2010   Past Medical History:  Diagnosis Date   Colon polyps  Detached retina, right 1992   Dr[facility] [name] at [facility] did orbital band procedure with good results.    High cholesterol    Hypertension    Poison ivy dermatitis 07/29/2015    Family History  Problem Relation Age of Onset   Parkinsonism Mother    Colon polyps Father    Colon cancer Father 61   Cancer Father        colon cancer with mets   Hyperlipidemia Father    Hypertension Father    Hypertension Brother    Diabetes Neg Hx    Heart disease Neg Hx    Esophageal cancer Neg Hx    Rectal cancer Neg Hx    Stomach cancer Neg Hx     Past Surgical History:  Procedure Laterality Date   COLONOSCOPY  04/2007   Dr.Magod   COLONOSCOPY   09/06/2014   Dr.Magod   OPEN REDUCTION INTERNAL FIXATION (ORIF) DISTAL RADIAL FRACTURE Right 04/08/2021   Procedure: Right OPEN REDUCTION INTERNAL FIXATION (ORIF) DISTAL RADIAL FRACTURE;  Surgeon: [name], MD;  Location: [place];  Service: Orthopedics;  Laterality: Right;   RETINAL DETACHMENT SURGERY     30 years ago   Social History   Occupational History   Occupation: busines    Employer: [employer]  Tobacco Use   Smoking status: Never   Smokeless tobacco: Never  Vaping Use   Vaping Use: Never used  Substance and Sexual Activity   Alcohol use: Not Currently    Comment: occ beer   Drug use: No   Sexual activity: Yes    Partners: Female

## 2021-07-30 ENCOUNTER — Other Ambulatory Visit (HOSPITAL_BASED_OUTPATIENT_CLINIC_OR_DEPARTMENT_OTHER): Payer: Self-pay

## 2021-07-31 ENCOUNTER — Ambulatory Visit: Payer: 59 | Admitting: Orthopedic Surgery

## 2021-07-31 ENCOUNTER — Ambulatory Visit
Admission: RE | Admit: 2021-07-31 | Discharge: 2021-07-31 | Disposition: A | Discharge status: Home / Self Care | Payer: 59 | Source: Ambulatory Visit | Attending: Orthopedic Surgery | Admitting: Orthopedic Surgery

## 2021-07-31 DIAGNOSIS — M7989 Other specified soft tissue disorders: Secondary | ICD-10-CM | POA: Diagnosis not present

## 2021-07-31 DIAGNOSIS — S52571A Other intraarticular fracture of lower end of right radius, initial encounter for closed fracture: Secondary | ICD-10-CM

## 2021-07-31 DIAGNOSIS — S52571D Other intraarticular fracture of lower end of right radius, subsequent encounter for closed fracture with routine healing: Secondary | ICD-10-CM | POA: Diagnosis not present

## 2021-08-03 ENCOUNTER — Telehealth: Payer: Self-pay | Admitting: Orthopedic Surgery

## 2021-08-03 NOTE — Telephone Encounter (Signed)
Patient called. He would like to know if he will be having surgery soon? Says the scan results are in. Would like a call. [phone] ?

## 2021-08-05 ENCOUNTER — Other Ambulatory Visit: Payer: Self-pay

## 2021-08-05 ENCOUNTER — Encounter (HOSPITAL_BASED_OUTPATIENT_CLINIC_OR_DEPARTMENT_OTHER): Payer: Self-pay | Admitting: Orthopedic Surgery

## 2021-08-10 ENCOUNTER — Other Ambulatory Visit: Payer: Self-pay | Admitting: Family Medicine

## 2021-08-10 DIAGNOSIS — I10 Essential (primary) hypertension: Secondary | ICD-10-CM

## 2021-08-10 DIAGNOSIS — E782 Mixed hyperlipidemia: Secondary | ICD-10-CM

## 2021-08-11 ENCOUNTER — Other Ambulatory Visit (HOSPITAL_BASED_OUTPATIENT_CLINIC_OR_DEPARTMENT_OTHER): Payer: Self-pay

## 2021-08-11 MED ORDER — LISINOPRIL 20 MG PO TABS
ORAL_TABLET | Freq: Every day | ORAL | 4 refills | Status: DC
  Filled 2021-08-11: qty 90, 90d supply, fill #0
  Filled 2021-11-10: qty 90, 90d supply, fill #1
  Filled 2022-02-17: qty 90, 90d supply, fill #2
  Filled 2022-05-13: qty 90, 90d supply, fill #3
  Filled 2022-08-11: qty 90, 90d supply, fill #4

## 2021-08-11 MED ORDER — PRAVASTATIN SODIUM 40 MG PO TABS
ORAL_TABLET | Freq: Every day | ORAL | 4 refills | Status: DC
  Filled 2021-08-11: qty 90, 90d supply, fill #0
  Filled 2021-11-10: qty 90, 90d supply, fill #1
  Filled 2022-02-17: qty 90, 90d supply, fill #2
  Filled 2022-05-13: qty 90, 90d supply, fill #3
  Filled 2022-08-11: qty 90, 90d supply, fill #4

## 2021-08-12 ENCOUNTER — Other Ambulatory Visit: Payer: Self-pay

## 2021-08-12 ENCOUNTER — Ambulatory Visit (HOSPITAL_BASED_OUTPATIENT_CLINIC_OR_DEPARTMENT_OTHER)
Admission: RE | Admit: 2021-08-12 | Discharge: 2021-08-12 | Disposition: A | Discharge status: Home / Self Care | Payer: 59 | Attending: Orthopedic Surgery | Admitting: Orthopedic Surgery

## 2021-08-12 ENCOUNTER — Ambulatory Visit (HOSPITAL_BASED_OUTPATIENT_CLINIC_OR_DEPARTMENT_OTHER): Payer: 59

## 2021-08-12 ENCOUNTER — Encounter (HOSPITAL_BASED_OUTPATIENT_CLINIC_OR_DEPARTMENT_OTHER)
Admission: RE | Disposition: A | Discharge status: Home / Self Care | Payer: Self-pay | Source: Home / Self Care | Attending: Orthopedic Surgery

## 2021-08-12 ENCOUNTER — Ambulatory Visit (HOSPITAL_BASED_OUTPATIENT_CLINIC_OR_DEPARTMENT_OTHER): Payer: 59 | Admitting: Anesthesiology

## 2021-08-12 ENCOUNTER — Encounter (HOSPITAL_BASED_OUTPATIENT_CLINIC_OR_DEPARTMENT_OTHER): Payer: Self-pay | Admitting: Orthopedic Surgery

## 2021-08-12 DIAGNOSIS — Z472 Encounter for removal of internal fixation device: Secondary | ICD-10-CM

## 2021-08-12 DIAGNOSIS — S52571A Other intraarticular fracture of lower end of right radius, initial encounter for closed fracture: Secondary | ICD-10-CM

## 2021-08-12 DIAGNOSIS — I10 Essential (primary) hypertension: Secondary | ICD-10-CM | POA: Insufficient documentation

## 2021-08-12 DIAGNOSIS — G709 Myoneural disorder, unspecified: Secondary | ICD-10-CM | POA: Diagnosis not present

## 2021-08-12 DIAGNOSIS — S52571D Other intraarticular fracture of lower end of right radius, subsequent encounter for closed fracture with routine healing: Secondary | ICD-10-CM | POA: Insufficient documentation

## 2021-08-12 DIAGNOSIS — Z969 Presence of functional implant, unspecified: Secondary | ICD-10-CM

## 2021-08-12 DIAGNOSIS — X58XXXD Exposure to other specified factors, subsequent encounter: Secondary | ICD-10-CM | POA: Insufficient documentation

## 2021-08-12 DIAGNOSIS — S52501A Unspecified fracture of the lower end of right radius, initial encounter for closed fracture: Secondary | ICD-10-CM | POA: Diagnosis not present

## 2021-08-12 DIAGNOSIS — G8918 Other acute postprocedural pain: Secondary | ICD-10-CM | POA: Diagnosis not present

## 2021-08-12 HISTORY — DX: Anxiety disorder, unspecified: F41.9

## 2021-08-12 HISTORY — PX: HARDWARE REMOVAL: SHX979

## 2021-08-12 SURGERY — REMOVAL, HARDWARE
Anesthesia: General | Laterality: Right

## 2021-08-12 MED ORDER — MIDAZOLAM HCL 2 MG/2ML IJ SOLN
INTRAMUSCULAR | Status: AC
  Filled 2021-08-12: qty 2

## 2021-08-12 MED ORDER — PROPOFOL 10 MG/ML IV BOLUS
INTRAVENOUS | Status: DC | PRN
  Administered 2021-08-12: 200 mg via INTRAVENOUS

## 2021-08-12 MED ORDER — LIDOCAINE 2% (20 MG/ML) 5 ML SYRINGE
INTRAMUSCULAR | Status: AC
  Filled 2021-08-12: qty 5

## 2021-08-12 MED ORDER — CEFAZOLIN SODIUM-DEXTROSE 2-4 GM/100ML-% IV SOLN
INTRAVENOUS | Status: AC
  Filled 2021-08-12: qty 100

## 2021-08-12 MED ORDER — FENTANYL CITRATE (PF) 100 MCG/2ML IJ SOLN: 25.0000 ug | INTRAMUSCULAR | Status: DC | PRN

## 2021-08-12 MED ORDER — ONDANSETRON HCL 4 MG/2ML IJ SOLN
INTRAMUSCULAR | Status: AC
  Filled 2021-08-12: qty 2

## 2021-08-12 MED ORDER — OXYCODONE HCL 5 MG PO TABS: 5.0000 mg | ORAL_TABLET | Freq: Four times a day (QID) | ORAL | 0 refills | Status: AC | PRN

## 2021-08-12 MED ORDER — ONDANSETRON HCL 4 MG/2ML IJ SOLN: 4.0000 mg | Freq: Four times a day (QID) | INTRAMUSCULAR | Status: DC | PRN

## 2021-08-12 MED ORDER — LIDOCAINE HCL (CARDIAC) PF 100 MG/5ML IV SOSY
PREFILLED_SYRINGE | INTRAVENOUS | Status: DC | PRN
  Administered 2021-08-12: 60 mg via INTRAVENOUS

## 2021-08-12 MED ORDER — DEXAMETHASONE SODIUM PHOSPHATE 10 MG/ML IJ SOLN
INTRAMUSCULAR | Status: AC
  Filled 2021-08-12: qty 1

## 2021-08-12 MED ORDER — OXYCODONE HCL 5 MG/5ML PO SOLN: 5.0000 mg | Freq: Once | ORAL | Status: DC | PRN

## 2021-08-12 MED ORDER — BUPIVACAINE-EPINEPHRINE (PF) 0.5% -1:200000 IJ SOLN
INTRAMUSCULAR | Status: DC | PRN
  Administered 2021-08-12: 25 mL via PERINEURAL

## 2021-08-12 MED ORDER — DEXAMETHASONE SODIUM PHOSPHATE 10 MG/ML IJ SOLN
INTRAMUSCULAR | Status: DC | PRN
Start: 2021-08-12 — End: 2021-08-12
  Administered 2021-08-12: 10 mg via INTRAVENOUS

## 2021-08-12 MED ORDER — OXYCODONE HCL 5 MG PO TABS: 5.0000 mg | ORAL_TABLET | Freq: Once | ORAL | Status: DC | PRN

## 2021-08-12 MED ORDER — ONDANSETRON HCL 4 MG/2ML IJ SOLN
INTRAMUSCULAR | Status: DC | PRN
  Administered 2021-08-12: 4 mg via INTRAVENOUS

## 2021-08-12 MED ORDER — LACTATED RINGERS IV SOLN: INTRAVENOUS | Status: DC

## 2021-08-12 MED ORDER — MIDAZOLAM HCL 2 MG/2ML IJ SOLN
2.0000 mg | Freq: Once | INTRAMUSCULAR | Status: AC
  Administered 2021-08-12: 2 mg via INTRAVENOUS

## 2021-08-12 MED ORDER — 0.9 % SODIUM CHLORIDE (POUR BTL) OPTIME
TOPICAL | Status: DC | PRN
  Administered 2021-08-12: 50 mL

## 2021-08-12 MED ORDER — FENTANYL CITRATE (PF) 100 MCG/2ML IJ SOLN
INTRAMUSCULAR | Status: AC
Start: 2021-08-12 — End: ?
  Filled 2021-08-12: qty 2

## 2021-08-12 MED ORDER — FENTANYL CITRATE (PF) 100 MCG/2ML IJ SOLN
INTRAMUSCULAR | Status: AC
  Filled 2021-08-12: qty 2

## 2021-08-12 MED ORDER — CEFAZOLIN SODIUM-DEXTROSE 2-4 GM/100ML-% IV SOLN
2.0000 g | INTRAVENOUS | Status: AC
  Administered 2021-08-12: 2 g via INTRAVENOUS

## 2021-08-12 MED ORDER — PROPOFOL 10 MG/ML IV BOLUS
INTRAVENOUS | Status: AC
  Filled 2021-08-12: qty 20

## 2021-08-12 MED ORDER — FENTANYL CITRATE (PF) 100 MCG/2ML IJ SOLN
100.0000 ug | Freq: Once | INTRAMUSCULAR | Status: AC
  Administered 2021-08-12: 100 ug via INTRAVENOUS

## 2021-08-12 SURGICAL SUPPLY — 66 items
APL PRP STRL LF DISP 70% ISPRP (MISCELLANEOUS) ×1
BLADE MINI RND TIP GREEN BEAV (BLADE) IMPLANT
BLADE SURG 15 STRL LF DISP TIS (BLADE) ×2 IMPLANT
BLADE SURG 15 STRL SS (BLADE) ×4
BNDG CMPR 9X4 STRL LF SNTH (GAUZE/BANDAGES/DRESSINGS) ×1
BNDG ELASTIC 2X5.8 VLCR STR LF (GAUZE/BANDAGES/DRESSINGS) IMPLANT
BNDG ELASTIC 3X5.8 VLCR STR LF (GAUZE/BANDAGES/DRESSINGS) ×2 IMPLANT
BNDG ESMARK 4X9 LF (GAUZE/BANDAGES/DRESSINGS) ×2 IMPLANT
BNDG GAUZE ELAST 4 BULKY (GAUZE/BANDAGES/DRESSINGS) ×2 IMPLANT
CATH ROBINSON RED A/P 10FR (CATHETERS) IMPLANT
CHLORAPREP W/TINT 26 (MISCELLANEOUS) ×2 IMPLANT
CORD BIPOLAR FORCEPS 12FT (ELECTRODE) ×2 IMPLANT
COVER BACK TABLE 60X90IN (DRAPES) ×2 IMPLANT
COVER MAYO STAND STRL (DRAPES) ×2 IMPLANT
CUFF TOURN SGL QUICK 18X4 (TOURNIQUET CUFF) ×2 IMPLANT
DRAPE EXTREMITY T 121X128X90 (DISPOSABLE) ×2 IMPLANT
DRAPE OEC MINIVIEW 54X84 (DRAPES) ×1 IMPLANT
DRAPE SURG 17X23 STRL (DRAPES) ×2 IMPLANT
DRSG PAD ABDOMINAL 8X10 ST (GAUZE/BANDAGES/DRESSINGS) IMPLANT
GAUZE 4X4 16PLY ~~LOC~~+RFID DBL (SPONGE) IMPLANT
GAUZE SPONGE 4X4 12PLY STRL (GAUZE/BANDAGES/DRESSINGS) ×2 IMPLANT
GAUZE XEROFORM 1X8 LF (GAUZE/BANDAGES/DRESSINGS) ×2 IMPLANT
GLOVE BIO SURGEON STRL SZ7 (GLOVE) ×3 IMPLANT
GLOVE BIOGEL PI IND STRL 7.0 (GLOVE) ×1 IMPLANT
GLOVE BIOGEL PI INDICATOR 7.0 (GLOVE) ×1
GOWN STRL REUS W/ TWL LRG LVL3 (GOWN DISPOSABLE) ×1 IMPLANT
GOWN STRL REUS W/ TWL XL LVL3 (GOWN DISPOSABLE) ×1 IMPLANT
GOWN STRL REUS W/TWL LRG LVL3 (GOWN DISPOSABLE) ×2
GOWN STRL REUS W/TWL XL LVL3 (GOWN DISPOSABLE) ×2
LOOP VESSEL MAXI BLUE (MISCELLANEOUS) IMPLANT
NDL HYPO 25X1 1.5 SAFETY (NEEDLE) IMPLANT
NDL KEITH (NEEDLE) IMPLANT
NEEDLE HYPO 25X1 1.5 SAFETY (NEEDLE) IMPLANT
NEEDLE KEITH (NEEDLE) IMPLANT
NS IRRIG 1000ML POUR BTL (IV SOLUTION) ×2 IMPLANT
PACK BASIN DAY SURGERY FS (CUSTOM PROCEDURE TRAY) ×2 IMPLANT
PAD CAST 3X4 CTTN HI CHSV (CAST SUPPLIES) ×1 IMPLANT
PAD CAST 4YDX4 CTTN HI CHSV (CAST SUPPLIES) IMPLANT
PADDING CAST ABS 3INX4YD NS (CAST SUPPLIES)
PADDING CAST ABS 4INX4YD NS (CAST SUPPLIES) ×1
PADDING CAST ABS COTTON 3X4 (CAST SUPPLIES) IMPLANT
PADDING CAST ABS COTTON 4X4 ST (CAST SUPPLIES) ×1 IMPLANT
PADDING CAST COTTON 3X4 STRL (CAST SUPPLIES) ×2
PADDING CAST COTTON 4X4 STRL (CAST SUPPLIES)
SLEEVE SCD COMPRESS KNEE MED (STOCKING) IMPLANT
SLING ARM FOAM STRAP XLG (SOFTGOODS) ×1 IMPLANT
SPIKE FLUID TRANSFER (MISCELLANEOUS) IMPLANT
SPLINT FIBERGLASS 4X30 (CAST SUPPLIES) ×2 IMPLANT
SPLINT PLASTER CAST XFAST 3X15 (CAST SUPPLIES) IMPLANT
SPLINT PLASTER XTRA FASTSET 3X (CAST SUPPLIES)
SUT ETHIBOND 3-0 V-5 (SUTURE) IMPLANT
SUT ETHILON 3 0 PS 1 (SUTURE) IMPLANT
SUT ETHILON 4 0 PS 2 18 (SUTURE) ×2 IMPLANT
SUT FIBERWIRE 3-0 18 TAPR NDL (SUTURE)
SUT FIBERWIRE 4-0 18 DIAM BLUE (SUTURE)
SUT MNCRL AB 4-0 PS2 18 (SUTURE) ×1 IMPLANT
SUT MON AB 5-0 PS2 18 (SUTURE) IMPLANT
SUT PROLENE 6 0 P 1 18 (SUTURE) IMPLANT
SUT SILK 2 0 PERMA HAND 18 BK (SUTURE) IMPLANT
SUT SUPRAMID 4-0 (SUTURE) IMPLANT
SUTURE FIBERWR 3-0 18 TAPR NDL (SUTURE) IMPLANT
SUTURE FIBERWR 4-0 18 DIA BLUE (SUTURE) IMPLANT
SYR BULB EAR ULCER 3OZ GRN STR (SYRINGE) ×2 IMPLANT
SYR CONTROL 10ML LL (SYRINGE) IMPLANT
TOWEL GREEN STERILE FF (TOWEL DISPOSABLE) ×4 IMPLANT
UNDERPAD 30X36 HEAVY ABSORB (UNDERPADS AND DIAPERS) ×2 IMPLANT

## 2021-08-12 NOTE — Progress Notes (Signed)
Assisted Dr. Hodierne with right, supraclavicular, ultrasound guided block. Side rails up, monitors on throughout procedure. See vital signs in flow sheet. Tolerated Procedure well. 

## 2021-08-12 NOTE — Transfer of Care (Signed)
Immediate Anesthesia Transfer of Care Note  Patient: Stephen [name].  Procedure(s) Performed: RIGHT WRIST REMOVAL OF HARDWARE, EXTENSOR TENODESIS (Right)  Patient Location: PACU  Anesthesia Type:GA combined with regional for post-op pain  Level of Consciousness: drowsy and patient cooperative  Airway & Oxygen Therapy: Patient Spontanous Breathing and Patient connected to face mask oxygen  Post-op Assessment: Report given to RN and Post -op Vital signs reviewed and stable  Post vital signs: Reviewed and stable  Last Vitals:  Vitals Value Taken Time  BP    Temp    Pulse 77 08/12/21 1600  Resp 10 08/12/21 1600  SpO2 100 % 08/12/21 1600  Vitals shown include unvalidated device data.  Last Pain:  Vitals:   08/12/21 1229  TempSrc: Oral  PainSc: 0-No pain      Patients Stated Pain Goal: 3 (48/27/07 8675)  Complications: No notable events documented.

## 2021-08-12 NOTE — Anesthesia Preprocedure Evaluation (Signed)
Anesthesia Evaluation  Patient identified by MRN, date of birth, ID band Patient awake    Reviewed: Allergy & Precautions, H&P , NPO status , Patient's Chart, lab work & pertinent test results  Airway Mallampati: II   Neck ROM: full    Dental   Pulmonary neg pulmonary ROS,    breath sounds clear to auscultation       Cardiovascular hypertension,  Rhythm:regular Rate:Normal     Neuro/Psych PSYCHIATRIC DISORDERS Anxiety  Neuromuscular disease    GI/Hepatic   Endo/Other    Renal/GU      Musculoskeletal   Abdominal   Peds  Hematology   Anesthesia Other Findings   Reproductive/Obstetrics                             Anesthesia Physical Anesthesia Plan  ASA: 2  Anesthesia Plan: General   Post-op Pain Management: Regional block*   Induction: Intravenous  PONV Risk Score and Plan: 2 and Ondansetron, Dexamethasone, Midazolam and Treatment may vary due to age or medical condition  Airway Management Planned: LMA  Additional Equipment:   Intra-op Plan:   Post-operative Plan: Extubation in OR  Informed Consent: I have reviewed the patients History and Physical, chart, labs and discussed the procedure including the risks, benefits and alternatives for the proposed anesthesia with the patient or authorized representative who has indicated his/her understanding and acceptance.     Dental advisory given  Plan Discussed with: CRNA, Anesthesiologist and Surgeon  Anesthesia Plan Comments:         Anesthesia Quick Evaluation

## 2021-08-12 NOTE — Interval H&P Note (Signed)
History and Physical Interval Note:  08/12/2021 2:08 PM  Stephen [name].  has presented today for surgery, with the diagnosis of right distal radius fracture.  The various methods of treatment have been discussed with the patient and family. After consideration of risks, benefits and other options for treatment, the patient has consented to  Procedure(s): RIGHT WRIST REMOVAL OF HARDWARE, EXTENSOR TENODESIS (Right) as a surgical intervention.  The patient's history has been reviewed, patient examined, no change in status, stable for surgery.  I have reviewed the patient's chart and labs.  Questions were answered to the patient's satisfaction.     Endi Lagman Lawson Isabell

## 2021-08-12 NOTE — Op Note (Signed)
Date of Surgery: 08/12/2021  INDICATIONS: Patient is a 61 y.o.-year-old male with a right severely comminuted, intra-articular distal radius fracture status post bridge plate fixation.  He is now 16 weeks out from his initial surgery with recent CT scan showing interval fracture healing although with some persistent fracture lines .  Risks, benefits, and alternatives to surgery were again discussed with the patient in the preoperative area. The patient wishes to proceed with surgery.  Informed consent was signed after our discussion.   PREOPERATIVE DIAGNOSIS:  Closed right intra-articular distal radius fracture, 3+ fragments Retained orthopedic hardware  POSTOPERATIVE DIAGNOSIS: Same.  PROCEDURE:  Removal of hardware Tenolysis of ECRB tendon Tenolysis of ECRL tendon Tenolysis of EDC to index finger   SURGEON: Audria Nine, M.D.  ASSIST:   ANESTHESIA:  Regional, general  IV FLUIDS AND URINE: See anesthesia.  ESTIMATED BLOOD LOSS: <5 mL.  IMPLANTS:  Implant Name Type Inv. Item Serial No. Manufacturer Lot No. LRB No. Used Action  SCREW LOCK MULTI THRD 3.0X10 - [id number] Screw SCREW LOCK MULTI THRD 3.0X10  [employer]  Right 1 Explanted  SCREW LOCK MULTI THRD 3.0X12 - [id number] Screw SCREW LOCK MULTI THRD 3.0X12  [employer]KELET[employer]  Right 4 Explanted  SCREW COMP MULTI THRD 3.0X10 - [id number] Screw SCREW COMP MULTI THRD 3.0X10  SKELET[employer]  Right 2 Explanted  SCREW COMP MULTI THRD 3.0X12 - [id number] Screw SCREW COMP MULTI THRD 3.0X12  SKELET[employer]YNAM[employer]  Right 2 Explanted  PLATE SHRT TI SPANN DORSAL 160 - [id number] Plate PLATE SHRT TI SPANN DORSAL 160  SKELET[employer]YNAM[employer]  Right 1 Explanted     DRAINS: None  COMPLICATIONS: None  DESCRIPTION OF PROCEDURE: The patient was met in the preoperative holding area where the surgical site was marked and the consent form was verified.  The patient was then taken to the operating room and transferred to the operating table.   All bony prominences were well padded.  A tourniquet was applied to the right upper arm.  General endotracheal anesthesia was induced.  The operative extremity was prepped and draped in the usual and sterile fashion.  A formal time-out was performed to confirm that this was the correct patient, surgery, side, and site.   Following timeout, the limb was gently exsanguinated with an Esmarch bandage and the tourniquet inflated to 250 mmHg.  The previous incisions over the index metacarpal, distal radius, and mid forearm were sharply incised.  SR with the proximalmost incision.  Blunt dissection was used to identify both the first dorsal compartment tendons and the underlying radial wrist extensors.  The interval between these 2 tendon groups was identified.  The bridge plate was identified just deep to the radial wrist extensor tendons.  The screw holes were identified and the screws were removed uneventfully.  I then turned my attention to the distal incision over the index metacarpal.  Blunt dissection was used to identify both the EDC to the index finger and the bridge plate.  The screw holes were identified and the screws were removed uneventfully.  The plate was removed from this distal wound.  I then returned to the proximal wound and using a tenotomy scissor and freer elevator performed a tenolysis of the ECRB and ECRL tendons.  I then performed a similar procedure with tenolysis of the EDC tendon to the index finger.  The fluoroscopy machine was brought in confirming that the plate and all screws were removed.  Range of motion of the wrist was performed and  there was no motion at the fracture site on both the AP or lateral views.  Wounds were then thoroughly irrigated with copious sterile saline.  The tourniquet was let down and hemostasis was achieved with direct pressure of the wounds and bipolar electrocautery.  The wounds were then closed in a layered fashion using a 3-0 Monocryl suture in a buried  interrupted fashion followed by a 4-0 nylon suture in horizontal mattress fashion.  The wounds were then dressed with Xeroform, folded Kerlix, cast padding, and an Ace wrap.  The patient was then extubated uneventfully and transferred to the postoperative bed.  All counts were correct x2 at the end the procedure.  The patient was then taken the PACU in stable condition.   POSTOPERATIVE PLAN: We will be discharged home with appropriate pain medication and discharge instructions.  He will be referred to hand therapy to work on gentle range of motion at the wrist and fingers.  I will see him back in the office in 10 to 14 days for his first postop visit.  Audria Nine, MD 4:04 PM

## 2021-08-12 NOTE — Discharge Instructions (Addendum)
Audria Nine, M.D. Hand Surgery  POST-OPERATIVE DISCHARGE INSTRUCTIONS   PRESCRIPTIONS: - You have been given a prescription to be taken as directed for post-operative pain control.  You may also take over the counter ibuprofen/aleve and tylenol for pain. Take this as directed on the packaging. Do not exceed 3000 mg tylenol/acetaminophen in 24 hours.  Ibuprofen 600-800 mg (3-4) tablets by mouth every 6 hours as needed for pain.   OR  Aleve 2 tablets by mouth every 12 hours (twice daily) as needed for pain.   AND/OR  Tylenol 1000 mg (2 tablets) every 8 hours as needed for pain.  - Please use your pain medication carefully, as refills are limited and you may not be provided with one.  As stated above, please use over the counter pain medicine - it will also be helpful with decreasing your swelling.    ANESTHESIA: -After your surgery, post-surgical discomfort or pain is likely. This discomfort can last several days to a few weeks. At certain times of the day your discomfort may be more intense.   Did you receive a nerve block?   - A nerve block can provide pain relief for one hour to two days after your surgery. As long as the nerve block is working, you will experience little or no sensation in the area the surgeon operated on.  - As the nerve block wears off, you will begin to experience pain or discomfort. It is very important that you begin taking your prescribed pain medication before the nerve block fully wears off. Treating your pain at the first sign of the block wearing off will ensure your pain is better controlled and more tolerable when full-sensation returns. Do not wait until the pain is intolerable, as the medicine will be less effective. It is better to treat pain in advance than to try and catch up.   General Anesthesia:  If you did not receive a nerve block during your surgery, you will need to start taking your pain medication shortly after your surgery and  should continue to do so as prescribed by your surgeon.     ICE AND ELEVATION: - You may use ice for the first 48-72 hours, but it is not critical.   - Motion of your fingers is very important to decrease the swelling.  - Elevation, as much as possible for the next 48 hours, is critical for decreasing swelling as well as for pain relief. Elevation means when you are seated or lying down, you hand should be at or above your heart. When walking, the hand needs to be at or above the level of your elbow.  - If the bandage gets too tight, it may need to be loosened. Please contact our office and we will instruct you in how to do this.    SURGICAL BANDAGES:  - Keep your dressing and/or splint clean and dry at all times.  You can remove your dressing 7 days from now and change with a dry dressing or Band-Aids as needed thereafter. - You may place a plastic bag over your bandage to shower, but be careful, do not get your bandages wet.  - After the bandages have been removed, it is OK to get the stitches wet in a shower or with hand washing. Do Not soak or submerge the wound yet. Please do not use lotions or creams on the stitches.      HAND THERAPY:  - You may not need any. If you do,  we will begin this at your follow up visit in the clinic.    ACTIVITY AND WORK: - You are encouraged to move any fingers which are not in the bandage.  - Light use of the fingers is allowed to assist the other hand with daily hygiene and eating, but strong gripping or lifting is often uncomfortable and should be avoided.  - You might miss a variable period of time from work and hopefully this issue has been discussed prior to surgery. You may not do any heavy work with your affected hand for about 2 weeks.    [facility] [address] [place],  [place]  [address] [phone]    Post Anesthesia Home Care Instructions  Activity: Get plenty of rest for the remainder of the day. A  responsible individual must stay with you for 24 hours following the procedure.  For the next 24 hours, DO NOT: -Drive a car -Paediatric nurse -Drink alcoholic beverages -Take any medication unless instructed by your physician -Make any legal decisions or sign important papers.  Meals: Start with liquid foods such as gelatin or soup. Progress to regular foods as tolerated. Avoid greasy, spicy, heavy foods. If nausea and/or vomiting occur, drink only clear liquids until the nausea and/or vomiting subsides. Call your physician if vomiting continues.  Special Instructions/Symptoms: Your throat may feel dry or sore from the anesthesia or the breathing tube placed in your throat during surgery. If this causes discomfort, gargle with warm salt water. The discomfort should disappear within 24 hours.  If you had a scopolamine patch placed behind your ear for the management of post- operative nausea and/or vomiting:  1. The medication in the patch is effective for 72 hours, after which it should be removed.  Wrap patch in a tissue and discard in the trash. Wash hands thoroughly with soap and water. 2. You may remove the patch earlier than 72 hours if you experience unpleasant side effects which may include dry mouth, dizziness or visual disturbances. 3. Avoid touching the patch. Wash your hands with soap and water after contact with the patch.   Regional Anesthesia Blocks  1. Numbness or the inability to move the "blocked" extremity may last from 3-48 hours after placement. The length of time depends on the medication injected and your individual response to the medication. If the numbness is not going away after 48 hours, call your surgeon.  2. The extremity that is blocked will need to be protected until the numbness is gone and the  Strength has returned. Because you cannot feel it, you will need to take extra care to avoid injury. Because it may be weak, you may have difficulty moving it or  using it. You may not know what position it is in without looking at it while the block is in effect.  3. For blocks in the legs and feet, returning to weight bearing and walking needs to be done carefully. You will need to wait until the numbness is entirely gone and the strength has returned. You should be able to move your leg and foot normally before you try and bear weight or walk. You will need someone to be with you when you first try to ensure you do not fall and possibly risk injury.  4. Bruising and tenderness at the needle site are common side effects and will resolve in a few days.  5. Persistent numbness or new problems with movement should be communicated to the surgeon or the  [place] [phone] [place] [phone]).

## 2021-08-12 NOTE — Anesthesia Procedure Notes (Signed)
Procedure Name: LMA Insertion Date/Time: 08/12/2021 2:46 PM Performed by: Willa Frater, CRNA Pre-anesthesia Checklist: Patient identified, Emergency Drugs available, Suction available and Patient being monitored Patient Re-evaluated:Patient Re-evaluated prior to induction Oxygen Delivery Method: Circle system utilized Preoxygenation: Pre-oxygenation with 100% oxygen Induction Type: IV induction Ventilation: Mask ventilation without difficulty LMA: LMA inserted LMA Size: 5.0 Number of attempts: 1 Airway Equipment and Method: Bite block Placement Confirmation: positive ETCO2 Tube secured with: Tape Dental Injury: Teeth and Oropharynx as per pre-operative assessment

## 2021-08-12 NOTE — Anesthesia Procedure Notes (Signed)
Anesthesia Regional Block: Supraclavicular block   Pre-Anesthetic Checklist: , timeout performed,  Correct Patient, Correct Site, Correct Laterality,  Correct Procedure, Correct Position, site marked,  Risks and benefits discussed,  Surgical consent,  Pre-op evaluation,  At surgeon's request and post-op pain management  Laterality: Right  Prep: chloraprep       Needles:  Injection technique: Single-shot  Needle Type: Echogenic Stimulator Needle     Needle Length: 5cm  Needle Gauge: 22     Additional Needles:   Procedures:, nerve stimulator,,,,,     Nerve Stimulator or Paresthesia:  Response: biceps flexion, 0.45 mA  Additional Responses:   Narrative:  Start time: 08/12/2021 2:07 PM End time: 08/12/2021 2:14 PM Injection made incrementally with aspirations every 5 mL.  Performed by: Personally  Anesthesiologist: Albertha Ghee, MD  Additional Notes: Functioning IV was confirmed and monitors were applied.  A 36m 22ga Arrow echogenic stimulator needle was used. Sterile prep and drape,hand hygiene and sterile gloves were used.  Negative aspiration and negative test dose prior to incremental administration of local anesthetic. The patient tolerated the procedure well.  Ultrasound guidance: relevent anatomy identified, needle position confirmed, local anesthetic spread visualized around nerve(s), vascular puncture avoided.  Image printed for medical record.

## 2021-08-13 ENCOUNTER — Encounter (HOSPITAL_BASED_OUTPATIENT_CLINIC_OR_DEPARTMENT_OTHER): Payer: Self-pay | Admitting: Orthopedic Surgery

## 2021-08-13 NOTE — Anesthesia Postprocedure Evaluation (Signed)
Anesthesia Post Note  Patient: Stephen [name].  Procedure(s) Performed: RIGHT WRIST REMOVAL OF HARDWARE, EXTENSOR TENODESIS (Right)     Patient location during evaluation: PACU Anesthesia Type: General and Regional Level of consciousness: awake and alert Pain management: pain level controlled Vital Signs Assessment: post-procedure vital signs reviewed and stable Respiratory status: spontaneous breathing, nonlabored ventilation, respiratory function stable and patient connected to nasal cannula oxygen Cardiovascular status: blood pressure returned to baseline and stable Postop Assessment: no apparent nausea or vomiting Anesthetic complications: no   No notable events documented.  Last Vitals:  Vitals:   08/12/21 1630 08/12/21 1645  BP: (!) 149/80 (!) 162/94  Pulse: 82 75  Resp: 11 16  Temp:  36.7 C  SpO2: 98% 100%    Last Pain:  Vitals:   08/12/21 1645  TempSrc:   PainSc: 0-No pain                 Korah Hufstedler S

## 2021-08-21 ENCOUNTER — Ambulatory Visit (INDEPENDENT_AMBULATORY_CARE_PROVIDER_SITE_OTHER): Payer: 59 | Admitting: Orthopedic Surgery

## 2021-08-21 DIAGNOSIS — S52571A Other intraarticular fracture of lower end of right radius, initial encounter for closed fracture: Secondary | ICD-10-CM

## 2021-08-21 NOTE — Progress Notes (Signed)
Office Visit Note   Patient: Stephen [name].           Date of Birth: Jan 14, 1961           MRN: [id number] Visit Date: 08/21/2021              Requested by: [name], MD [address] [place],  [place] [id number] PCP: [name], MD   Assessment & Plan: Visit Diagnoses:  1. Other closed intra-articular fracture of distal end of right radius, initial encounter     Plan: Patient is now 17 weeks out from his fracture fixation and 9 days out from removal of the plate.  The plate was left in for a prolonged period given the slow fracture healing on CT scans.  His incisions are well well-healed.  He is seeing therapy on Monday to start working on gentle range of motion of the wrist.  I will get an EMG/nerve conduction study given his continued complaints involving the middle finger paresthesias.  I would see him back once the EMG/ nerve conduction study is completed.  Follow-Up Instructions: No follow-ups on file.   Orders:  No orders of the defined types were placed in this encounter.  No orders of the defined types were placed in this encounter.     Procedures: No procedures performed   Clinical Data: No additional findings.   Subjective: Chief Complaint  Patient presents with   Right Wrist - Routine Post Op    This is a 61 year old right-hand-dominant male who presents for follow-up following removal of a right distal radius bridge plate for a severely comminuted, intra-articular distal radius fracture.  He is now 17 weeks out from his fracture fixation and 10 days out from removal of the plate.  He has no pain today.  He has been in a short volar splint while the incisions to heal.  He still describes numbness in just the middle finger with normal sensation in the remainder of the fingers.   Review of Systems   Objective: Vital Signs: There were no vitals taken for this visit.  Physical Exam  Right Hand Exam   Other  Erythema:  absent Sensation: normal Pulse: present  Comments:  Incisions clean, dry, and well approximated. He has limited ROM secondary to stiffness w/ approximately 25 deg extension and 20 deg flexion.  He has full ROM of his fingers.     Specialty Comments:  No specialty comments available.  Imaging: No results found.   PMFS History: Patient Active Problem List   Diagnosis Date Noted   Retained orthopedic hardware    Closed fracture of right distal radius 04/07/2021   Heme positive stool 08/06/2020   Hordeolum externum of right upper eyelid 08/06/2020   Right hand pain 11/15/2017   Dermatitis due to drug reaction 09/02/2017   Drug-induced erythroderma 09/02/2017   Laryngitis, acute 08/04/2017   Nonallopathic lesion of cervical region 04/06/2017   Nonallopathic lesion of thoracic region 04/06/2017   Nonallopathic lesion of lumbosacral region 04/06/2017   Cervical radiculopathy at C8 03/04/2017   Excessive cerumen in both ear canals 02/15/2017   Neuropathy, cervical plexus 02/15/2017   Poison ivy dermatitis 07/29/2015   Pharyngitis 09/26/2013   Back strain 06/11/2013   Healthcare maintenance 04/25/2011   Mixed hyperlipidemia 03/14/2010   Essential hypertension 03/14/2010   Past Medical History:  Diagnosis Date   Anxiety    Colon polyps    Detached retina, right 03/22/1990   Dr. [name] at  [facility] did orbital band procedure with good results.    High cholesterol    Hypertension    Poison ivy dermatitis 07/29/2015    Family History  Problem Relation Age of Onset   Parkinsonism Mother    Colon polyps Father    Colon cancer Father 3   Cancer Father        colon cancer with mets   Hyperlipidemia Father    Hypertension Father    Hypertension Brother    Diabetes Neg Hx    Heart disease Neg Hx    Esophageal cancer Neg Hx    Rectal cancer Neg Hx    Stomach cancer Neg Hx     Past Surgical History:  Procedure Laterality Date   COLONOSCOPY  04/2007   Dr.Magod    COLONOSCOPY  09/06/2014   Dr.Magod   HARDWARE REMOVAL Right 08/12/2021   Procedure: RIGHT WRIST REMOVAL OF HARDWARE, EXTENSOR TENODESIS;  Surgeon: [name], MD;  Location: [facility];  Service: Orthopedics;  Laterality: Right;   OPEN REDUCTION INTERNAL FIXATION (ORIF) DISTAL RADIAL FRACTURE Right 04/08/2021   Procedure: Right OPEN REDUCTION INTERNAL FIXATION (ORIF) DISTAL RADIAL FRACTURE;  Surgeon: [name], MD;  Location: [facility];  Service: Orthopedics;  Laterality: Right;   RETINAL DETACHMENT SURGERY     30 years ago   Social History   Occupational History   Occupation: busines    Employer: [employer]  Tobacco Use   Smoking status: Never   Smokeless tobacco: Never  Vaping Use   Vaping Use: Never used  Substance and Sexual Activity   Alcohol use: Yes    Comment: occ beer   Drug use: No   Sexual activity: Yes    Partners: Female

## 2021-08-24 ENCOUNTER — Ambulatory Visit: Payer: 59 | Admitting: Rehabilitative and Restorative Service Providers"

## 2021-08-24 ENCOUNTER — Encounter: Payer: Self-pay | Admitting: Rehabilitative and Restorative Service Providers"

## 2021-08-24 DIAGNOSIS — M25531 Pain in right wrist: Secondary | ICD-10-CM

## 2021-08-24 DIAGNOSIS — R278 Other lack of coordination: Secondary | ICD-10-CM | POA: Diagnosis not present

## 2021-08-24 DIAGNOSIS — M25641 Stiffness of right hand, not elsewhere classified: Secondary | ICD-10-CM | POA: Diagnosis not present

## 2021-08-24 DIAGNOSIS — M79641 Pain in right hand: Secondary | ICD-10-CM

## 2021-08-24 DIAGNOSIS — M6281 Muscle weakness (generalized): Secondary | ICD-10-CM

## 2021-08-24 DIAGNOSIS — R601 Generalized edema: Secondary | ICD-10-CM | POA: Diagnosis not present

## 2021-08-24 NOTE — Therapy (Signed)
OUTPATIENT OCCUPATIONAL THERAPY TREATMENT & PROGRESS NOTE / Re-Evaluation   Patient Name: Stephen [name]. MRN: [id number] DOB:02/09/1961, 61 y.o., male Today's Date: 08/25/2021  PCP: [name], MD REFERRING PROVIDER: [name], MD  Progress Note  Reporting Period 05/19/21 to 08/24/21.  See note below for Objective Data and Assessment of Progress/Goals.    OT End of Session - 08/24/21 0848     Visit Number 7    Number of Visits 24    Date for OT Re-Evaluation 10/16/21    Authorization Type [employer]    Progress Note Due on Visit --   Every 10 th visit   OT Start Time 0848    OT Stop Time [phone]    OT Time Calculation (min) 64 min    Activity Tolerance Patient tolerated treatment well;No increased pain    Behavior During Therapy [facility] for tasks assessed/performed             Past Medical History:  Diagnosis Date   Anxiety    Colon polyps    Detached retina, right 03/22/1990   Dr. [name] at [facility] did orbital band procedure with good results.    High cholesterol    Hypertension    Poison ivy dermatitis 07/29/2015   Past Surgical History:  Procedure Laterality Date   COLONOSCOPY  04/2007   Dr.Magod   COLONOSCOPY  09/06/2014   Dr.Magod   HARDWARE REMOVAL Right 08/12/2021   Procedure: RIGHT WRIST REMOVAL OF HARDWARE, EXTENSOR TENODESIS;  Surgeon: [name], MD;  Location: [place];  Service: Orthopedics;  Laterality: Right;   OPEN REDUCTION INTERNAL FIXATION (ORIF) DISTAL RADIAL FRACTURE Right 04/08/2021   Procedure: Right OPEN REDUCTION INTERNAL FIXATION (ORIF) DISTAL RADIAL FRACTURE;  Surgeon: [name], MD;  Location: [place];  Service: Orthopedics;  Laterality: Right;   RETINAL DETACHMENT SURGERY     30 years ago   Patient Active Problem List   Diagnosis Date Noted   Retained orthopedic hardware    Closed fracture of right distal radius 04/07/2021   Heme positive stool 08/06/2020   Hordeolum externum  of right upper eyelid 08/06/2020   Right hand pain 11/15/2017   Dermatitis due to drug reaction 09/02/2017   Drug-induced erythroderma 09/02/2017   Laryngitis, acute 08/04/2017   Nonallopathic lesion of cervical region 04/06/2017   Nonallopathic lesion of thoracic region 04/06/2017   Nonallopathic lesion of lumbosacral region 04/06/2017   Cervical radiculopathy at C8 03/04/2017   Excessive cerumen in both ear canals 02/15/2017   Neuropathy, cervical plexus 02/15/2017   Poison ivy dermatitis 07/29/2015   Pharyngitis 09/26/2013   Back strain 06/11/2013   Healthcare maintenance 04/25/2011   Mixed hyperlipidemia 03/14/2010   Essential hypertension 03/14/2010    ONSET DATE: 08/12/21 new DOS   REFERRING DIAG: Other closed intra-articular fracture of distal end of right radius, initial encounter   THERAPY DIAG:  Pain in right wrist  Right hand pain  Muscle weakness (generalized)  Stiffness of right hand, not elsewhere classified  Other lack of coordination  Generalized edema   PERTINENT HISTORY:  08/21/21: Per MD: "Patient is now 17 weeks out from his fracture fixation and 9 days out from removal of the plate.  The plate was left in for a prolonged period given the slow fracture healing on CT scans.  His incisions are well well-healed.  He is seeing therapy on Monday to start working on gentle range of motion of the wrist.  I will get an EMG/nerve  conduction study given his continued complaints involving the middle finger paresthesias.  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 He has a 2018 dx: cervical plexus neuropathy."  PRECAUTIONS: 08/24/21: He is ~1.5 weeks s/p bridge plate removal after Rt wrist fx. Dynamics to fingers ok at week 3, dynamics to wrist ok at week 4 plus  weighted stretches & wean from orthotic at home, hand & wrist strength ~week 6 as tolerated, return to al "normal" (not heavy) activities ~ week 8-10    SUBJECTIVE:  He states having numbness in median nerve distribution still, feels weak in his hand, mild issues remaining with home activities now, has been self-weaning from orthotic at night and day.   PAIN:  Are you having pain? Yes- soreness  NPRS scale: 1-2/10 in right wrist and FA with numbness in median nerve distribution    OBJECTIVE:  (All objective assessments below are from initial evaluation on: 04/22/21 unless otherwise specified.)    ADLs: Overall ADLs: 05/19/21: Pt is now driving, going to work opening containers, etc., still has mild-moderate problems with higher level tasks however  08/24/21: He states only mild issues with most home tasks but has been avoiding heavy/painful jobs.     FUNCTIONAL OUTCOME MEASURES: Quick Dash Score 27% disability now (down from 61%)   08/24/21: Quick Dash Score  29% Impairment today   SENSATION:  Light touch: Impaired Semmes Weinstein Monofilament scale:  Right hand = 2.83 (normal) in ulnar and median now!   08/24/21: static 2PD:  99m MF, 637mthumb, 44m68mF   COORDINATION: 9 Hole Peg test: Right: NT sec; Left: NT sec Box and Blocks: 55 (was 45) Comments: now within 2 SD's (53) and is WNL,brace not needed today for performance  08/24/21: 9 Hole Peg test: Right: 30 sec; Left: 24.7 sec (decreased in right dominant hand) Box and Blocks: 62 (AVG is 71 Blocks) (WFL, approaching WNL)     EDEMA: mild diffuse swelling about hand still but much better now   08/24/21: 20.6cm circumferentially about right wrist just proximal to base of thumb (compared to 18.8cm in left wrist)   MUSCLE TONE: WNL's     SKIN INTEGRITY:  08/24/21: sx sites look well healing, some bruising and swelling where plate/screws came out.   PALPATION:  08/24/21: mildly TTP to sx sites    UE A/ROM:   A/ROM Rt 05/19/21   RT 08/24/21  Elbow flexion WNL   Elbow extension WNL   Wrist flexion 15* 20*  Wrist extension (-8*) 25*  Wrist ulnar deviation  26*  Wrist radial deviation  5*  Forearm pronation 73 73*  Forearm supination 45 47*  (Blank rows = not tested)   HAND A/ROM: (taking MF measures only as a sample of overall hand motion)    08/24/21: he has near full fist and thumb is tight but fully opposes. MF is still stiffest of fingers and continues to be most numb as well.    A/ROM Right 05/01/2021 Rt  05/19/21 Rt 08/24/21  Long MCP (0-90) 0-41  0-60* 0- 57  Long PIP (0-100) 0-69  0-99* (-10*) - 98  Long DIP (0-70) 0-35 0-65* 0- 59  (Blank rows = not tested)   UE MMT: (not tested until resistance appropriate)    MMT Right 04/22/2021  Elbow flexion    Elbow extension    Wrist flexion    Wrist extension    Wrist ulnar deviation    Wrist radial deviation    Wrist pronation    Wrist supination    (Blank rows = not tested)   HAND FUNCTION:  05/19/21: Grip strength: Right: 12 lbs tender; Left: 104 lbs  08/24/21: NT today per protocols    TODAY'S TREATMENT:   08/24/21: He is re-evaluated (37 minutes of session- new measures of function, sensation, motion, strength, goals updated, plan of care updated, etc.) due to 14 weeks since last seen and underwent new sx for bridge plate removal. He is still appropriate to continue therapy but goals needed updated to reflect current status.   OT goes over full new, updated HEP with him (as listed below) that he has no additional pain with, tolerates well, feels stretches, etc.  OT also discusses home safety and no weight training for ~5 more weeks, no heavy lifting for about 7-8 more weeks, advises to use caution when weaning orthotic and to wear to rest as often as needed or if painful or outside of the home (at work, etc.). He states understanding.  Exercises - Median Nerve Flossing - Tray  - 2-3 x daily - 10 reps - Median Nerve Glide  - 2-3 x daily - 10 reps -  Seated Wrist Flexion with Overpressure  - 3-4 x daily - 3-5 reps - 15 sec hold - Wrist Extension Stretch Pronated  - 3-4 x daily - 3-5 reps - 15 hold - Wrist Prayer Stretch  - 3-4 x daily - 3-5 reps - 15 sec hold - Seated Wrist Ulnar Deviation Stretch  - 3-4 x daily - 3-5 reps - 15 sec hold - Seated Wrist Radial Deviation Stretch  - 3-4 x daily - 3-5 reps - 15 hold - Forearm Supination Stretch  - 2-3 x daily - 3-5 reps - 15 sec hold - Standing Forearm Pronation and Supination AROM  - 4-6 x daily - 10-15 reps - Wrist AROM Radial Ulnar Deviation  - 4-6 x daily - 1 sets - 10-15 reps - Wrist AROM Flexion Extension  - 4-6 x daily - 10-15 reps - Hand PROM MCP Flexion  - 4-6 x daily - 3-5 reps - 15 hold - Seated Single Digit Intrinsic Stretch  - 4-6 x daily - 3-5 reps - 15-20 sec hold - Hand AROM DIP Blocking  - 4-6 x daily - 10 reps - 2 seconds hold - Tendon Glides  - 3-4 x daily - 5- 10 reps - 2-3 seconds hold - Thumb Opposition  - 2-3 x daily - 10 reps    PATIENT EDUCATION: Education details: HEP, splinting use, care and precautions. Person educated: Patient Education method: Explanation, Demonstration, Verbal cues, and Handouts Education comprehension: verbalized understanding, returned demonstration, and needs further education     HOME EXERCISE PROGRAM: See treatment section above [id number]   ASSESSMENT:   CLINICAL IMPRESSION: 08/24/21: He is re-evaluated due to 14 weeks since last seen and underwent new sx for bridge plate removal. He is still appropriate to continue therapy per POC set forth at last progress note. Will continue per protocols and pt tolerance. He may need support of dynamic orthotics at wrist and fingers to recapture full motion.   05/19/21: Due to pt understanding HEP, meeting all current goals, having no significant issues, he will be put on hold for OT until spanning bridge plate removed (1-2 more months), then  return for re-evaluation and continued therapy as needed.  OT will plan for 8 weeks of therapy post-op, per typical protocols.     GOALS: Goals reviewed with patient? No   SHORT TERM GOALS: (STG required if POC>30 days)   STG Name Target Date Goal status  1 Pt will be Mod I splinting use, care and precautions R wrist/forearm Baseline:  05/13/2021 MET  2 Pt will be Mod I initial HEP R UE as seen by ability to perform in clinic Baseline:  05/13/2021 MET and Upgrading HEP  3 Pt will be Mod I edema control techniques as seen by ability to state 1-2 techniques w/o vc's Baseline:  05/13/2021 MET    LONG TERM GOALS:    LTG Name Target Date Goal status  1 Pt will be Mod I updated HEP for R UE Baseline:  10/16/2021 08/24/21- Updated, understands and will continue progression.   2 Pt will be Mod I scar management and desensitization R forearm/wrist as observed in clinic Baseline:  08/24/2021 08/24/21- Met and understands.  3 Pt will increase TAM of right MF from 204* to at least 230* for tight, full fist ability.  10/16/2021 08/24/21- Goal updated to TAM of middle finger and tight full fist  4 Pt will demonstrate improved grip strength R UE as seen by ability to grip 70# or greater via JAMAR dynamometer Baseline: 100# or more in right hand (comparing to left hand baseline)  10/16/21 08/24/21: Goal updated after bridge plate removal   5 Pt will increase right wrist flex/ext ROM from 20*/25* to at least 45/50* respectively for better reaching and holding of home and work objects.   10/16/21  08/24/21: Goal updated after bridge plate removal   PLAN: OT FREQUENCY: 1-2x/week (up to 16 additional visits(24 in total))    OT DURATION: 8 additional weeks (through 10/16/21)    PLANNED INTERVENTIONS: self care/ADL training, therapeutic exercise, therapeutic activity, scar mobilization, passive range of motion, splinting, ultrasound, fluidotherapy, patient/family education, and DME and/or AE instructions   RECOMMENDED OTHER SERVICES: N/A   CONSULTED AND AGREED WITH PLAN OF CARE:  Patient  PLAN FOR NEXT SESSION: Per prior plan of care, he will resume therapy now, s/p bridge plate removal.  See him again next week, 3 weeks post-op to ensure good HEP performance and progress with goals/motion. Consider dynamic flexion for tight fingers , but wait until week 4 for dynamic wrist orthotic if needed (per protocols).    Benito Mccreedy, OTR/L, CHT 08/25/2021, 2:36 PM

## 2021-08-26 ENCOUNTER — Telehealth: Payer: Self-pay | Admitting: Orthopedic Surgery

## 2021-08-26 NOTE — Telephone Encounter (Signed)
Stephen [name] is returning a call to schedule his nerve conduction studies with Dr. Ernestina Patches.  His call back # is [phone]

## 2021-09-03 ENCOUNTER — Encounter: Payer: Self-pay | Admitting: Rehabilitative and Restorative Service Providers"

## 2021-09-03 ENCOUNTER — Ambulatory Visit (INDEPENDENT_AMBULATORY_CARE_PROVIDER_SITE_OTHER): Payer: 59 | Admitting: Rehabilitative and Restorative Service Providers"

## 2021-09-03 ENCOUNTER — Encounter: Payer: Self-pay | Admitting: Orthopedic Surgery

## 2021-09-03 ENCOUNTER — Ambulatory Visit (INDEPENDENT_AMBULATORY_CARE_PROVIDER_SITE_OTHER): Payer: 59 | Admitting: Orthopedic Surgery

## 2021-09-03 DIAGNOSIS — M25641 Stiffness of right hand, not elsewhere classified: Secondary | ICD-10-CM | POA: Diagnosis not present

## 2021-09-03 DIAGNOSIS — M25531 Pain in right wrist: Secondary | ICD-10-CM

## 2021-09-03 DIAGNOSIS — M79641 Pain in right hand: Secondary | ICD-10-CM | POA: Diagnosis not present

## 2021-09-03 DIAGNOSIS — R601 Generalized edema: Secondary | ICD-10-CM | POA: Diagnosis not present

## 2021-09-03 DIAGNOSIS — R278 Other lack of coordination: Secondary | ICD-10-CM

## 2021-09-03 DIAGNOSIS — M6281 Muscle weakness (generalized): Secondary | ICD-10-CM | POA: Diagnosis not present

## 2021-09-03 DIAGNOSIS — S52571A Other intraarticular fracture of lower end of right radius, initial encounter for closed fracture: Secondary | ICD-10-CM

## 2021-09-03 NOTE — Progress Notes (Signed)
Post-Op Visit Note   Patient: Stephen [name].           Date of Birth: Nov 10, 1960           MRN: [id number] Visit Date: 09/03/2021 PCP: [name], MD   Assessment & Plan:  Chief Complaint:  Chief Complaint  Patient presents with   Right Wrist - Follow-up   Visit Diagnoses:  1. Other closed intra-articular fracture of distal end of right radius, initial encounter     Plan: Patient is two weeks s/p bridge plate removal.  He is able to make a strong, complete fist.  The numbness in the tip of his middle finger may be slightly improved.  He is working with therapy on ROM and strengthening.  I will see him back after his next therapy visit.   Follow-Up Instructions: No follow-ups on file.   Orders:  No orders of the defined types were placed in this encounter.  No orders of the defined types were placed in this encounter.   Imaging: No results found.  PMFS History: Patient Active Problem List   Diagnosis Date Noted   Retained orthopedic hardware    Closed fracture of right distal radius 04/07/2021   Heme positive stool 08/06/2020   Hordeolum externum of right upper eyelid 08/06/2020   Right hand pain 11/15/2017   Dermatitis due to drug reaction 09/02/2017   Drug-induced erythroderma 09/02/2017   Laryngitis, acute 08/04/2017   Nonallopathic lesion of cervical region 04/06/2017   Nonallopathic lesion of thoracic region 04/06/2017   Nonallopathic lesion of lumbosacral region 04/06/2017   Cervical radiculopathy at C8 03/04/2017   Excessive cerumen in both ear canals 02/15/2017   Neuropathy, cervical plexus 02/15/2017   Poison ivy dermatitis 07/29/2015   Pharyngitis 09/26/2013   Back strain 06/11/2013   Healthcare maintenance 04/25/2011   Mixed hyperlipidemia 03/14/2010   Essential hypertension 03/14/2010   Past Medical History:  Diagnosis Date   Anxiety    Colon polyps    Detached retina, right 03/22/1990   Dr. [name] at [facility] did orbital band  procedure with good results.    High cholesterol    Hypertension    Poison ivy dermatitis 07/29/2015    Family History  Problem Relation Age of Onset   Parkinsonism Mother    Colon polyps Father    Colon cancer Father 64   Cancer Father        colon cancer with mets   Hyperlipidemia Father    Hypertension Father    Hypertension Brother    Diabetes Neg Hx    Heart disease Neg Hx    Esophageal cancer Neg Hx    Rectal cancer Neg Hx    Stomach cancer Neg Hx     Past Surgical History:  Procedure Laterality Date   COLONOSCOPY  04/2007   Dr.Magod   COLONOSCOPY  09/06/2014   Dr.Magod   HARDWARE REMOVAL Right 08/12/2021   Procedure: RIGHT WRIST REMOVAL OF HARDWARE, EXTENSOR TENODESIS;  Surgeon: [name], MD;  Location: [place];  Service: Orthopedics;  Laterality: Right;   OPEN REDUCTION INTERNAL FIXATION (ORIF) DISTAL RADIAL FRACTURE Right 04/08/2021   Procedure: Right OPEN REDUCTION INTERNAL FIXATION (ORIF) DISTAL RADIAL FRACTURE;  Surgeon: [name], MD;  Location: [place];  Service: Orthopedics;  Laterality: Right;   RETINAL DETACHMENT SURGERY     30 years ago   Social History   Occupational History   Occupation: busines    Employer: [employer]  Tobacco Use  Smoking status: Never   Smokeless tobacco: Never  Vaping Use   Vaping Use: Never used  Substance and Sexual Activity   Alcohol use: Yes    Comment: occ beer   Drug use: No   Sexual activity: Yes    Partners: Female

## 2021-09-03 NOTE — Therapy (Signed)
OUTPATIENT OCCUPATIONAL THERAPY TREATMENT NOTE    Patient Name: Stephen [name]. MRN: [id number] DOB:1960/07/30, 61 y.o., male Today's Date: 09/03/2021  PCP: [name], MD REFERRING PROVIDER: [name], MD    OT End of Session - 09/03/21 0931     Visit Number 8    Number of Visits 24    Date for OT Re-Evaluation 10/16/21    Authorization Type [employer]    Progress Note Due on Visit --   Every 10 th visit   OT Start Time 0930    OT Stop Time 1016    OT Time Calculation (min) 46 min    Activity Tolerance Patient tolerated treatment well;No increased pain;Patient limited by pain;Patient limited by fatigue    Behavior During Therapy [facility] for tasks assessed/performed            Past Medical History:  Diagnosis Date   Anxiety    Colon polyps    Detached retina, right 03/22/1990   Dr. [name] at [facility] did orbital band procedure with good results.    High cholesterol    Hypertension    Poison ivy dermatitis 07/29/2015   Past Surgical History:  Procedure Laterality Date   COLONOSCOPY  04/2007   Dr.Magod   COLONOSCOPY  09/06/2014   Dr.Magod   HARDWARE REMOVAL Right 08/12/2021   Procedure: RIGHT WRIST REMOVAL OF HARDWARE, EXTENSOR TENODESIS;  Surgeon: [name], MD;  Location: [place];  Service: Orthopedics;  Laterality: Right;   OPEN REDUCTION INTERNAL FIXATION (ORIF) DISTAL RADIAL FRACTURE Right 04/08/2021   Procedure: Right OPEN REDUCTION INTERNAL FIXATION (ORIF) DISTAL RADIAL FRACTURE;  Surgeon: [name], MD;  Location: [place];  Service: Orthopedics;  Laterality: Right;   RETINAL DETACHMENT SURGERY     30 years ago   Patient Active Problem List   Diagnosis Date Noted   Retained orthopedic hardware    Closed fracture of right distal radius 04/07/2021   Heme positive stool 08/06/2020   Hordeolum externum of right upper eyelid 08/06/2020   Right hand pain 11/15/2017   Dermatitis due to drug reaction  09/02/2017   Drug-induced erythroderma 09/02/2017   Laryngitis, acute 08/04/2017   Nonallopathic lesion of cervical region 04/06/2017   Nonallopathic lesion of thoracic region 04/06/2017   Nonallopathic lesion of lumbosacral region 04/06/2017   Cervical radiculopathy at C8 03/04/2017   Excessive cerumen in both ear canals 02/15/2017   Neuropathy, cervical plexus 02/15/2017   Poison ivy dermatitis 07/29/2015   Pharyngitis 09/26/2013   Back strain 06/11/2013   Healthcare maintenance 04/25/2011   Mixed hyperlipidemia 03/14/2010   Essential hypertension 03/14/2010    ONSET DATE: 08/12/21 new DOS   REFERRING DIAG: Other closed intra-articular fracture of distal end of right radius, initial encounter   THERAPY DIAG:  Pain in right wrist  Other lack of coordination  Right hand pain  Muscle weakness (generalized)  Generalized edema  Stiffness of right hand, not elsewhere classified   PERTINENT HISTORY:  08/21/21: Per MD: "Patient is now 17 weeks out from his fracture fixation and 9 days out from removal of the plate.  The plate was left in for a prolonged period given the slow fracture healing on CT scans.  His incisions are well well-healed.  He is seeing therapy on Monday to start working on gentle range of motion of the wrist.  I will get an EMG/nerve conduction study given his continued complaints involving the middle finger paresthesias.  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 He has a 2018 dx: cervical plexus neuropathy."  PRECAUTIONS: 08/24/21: He is 3 weeks s/p bridge plate removal after Rt wrist fx. Dynamics to fingers ok PRN, dynamics to wrist ok at week 4 plus weighted stretches & wean from orthotic at home, hand & wrist strength at week 6 as tolerated, return to  all "normal" (not heavy) activities ~ week 8-10    SUBJECTIVE:  He states no significant pain now, wearing orthotic when out of the home and weaning at home. He has been doing HEP without issue.    PAIN:  Are you having pain? No NPRS scale: 0/10 in right wrist and FA     OBJECTIVE:  (All objective assessments below are from initial evaluation on: 04/22/21 unless otherwise specified.)    ADLs: Overall ADLs: 05/19/21: Pt is now driving, going to work opening containers, etc., still has mild-moderate problems with higher level tasks however  08/24/21: He states only mild issues with most home tasks but has been avoiding heavy/painful jobs.     FUNCTIONAL OUTCOME MEASURES: Quick Dash Score 27% disability now (down from 61%)   08/24/21: Quick Dash Score  29% Impairment today   SENSATION:  Light touch: Impaired Semmes Weinstein Monofilament scale:  Right hand = 2.83 (normal) in ulnar and median now!   08/24/21: static 2PD:  36m MF, 670mthumb, 73m41mF   COORDINATION: 9 Hole Peg test: Right: NT sec; Left: NT sec Box and Blocks: 55 (was 45) Comments: now within 2 SD's (53) and is WNL,brace not needed today for performance  08/24/21: 9 Hole Peg test: Right: 30 sec; Left: 24.7 sec (decreased in right dominant hand) Box and Blocks: 62 (AVG is 71 Blocks) (WFL, approaching WNL)     EDEMA: mild diffuse swelling about hand still but much better now   08/24/21: 20.6cm circumferentially about right wrist just proximal to base of thumb (compared to 18.8cm in left wrist)   09/03/21: 19.9cm circumferentially about right wrist just proximal to base of thumb   MUSCLE TONE: WNL's     SKIN INTEGRITY:  08/24/21: sx sites look well healing, some bruising and swelling where plate/screws came out.   PALPATION:  08/24/21: mildly TTP to sx sites    UE A/ROM:   A/ROM Rt 05/19/21  RT 08/24/21 Rt  09/03/21  Elbow flexion WNL    Elbow extension WNL    Wrist flexion 15* 20* 33  Wrist extension (-8*) 25* 30   Wrist ulnar deviation  26* 20  Wrist radial deviation  5* 9  Forearm pronation 73 73* 77  Forearm supination 45 47* 54  (Blank rows = not tested)   HAND A/ROM: (taking MF measures only as a sample of overall hand motion)    08/24/21: he has near full fist and thumb is tight but fully opposes. MF is still stiffest of fingers and continues to be most numb as well.    A/ROM Right 05/01/2021 Rt  05/19/21 Rt 08/24/21 Rt 09/03/21  Long MCP (0-90) 0-41  0-60* 0- 57 0-75  Long PIP (0-100) 0-69  0-99* (-10*) - 98 (-7) - 87   Long DIP (0-70) 0-35 0-65* 0- 59 0- 65  (Blank rows = not tested)   UE MMT: (not tested until resistance appropriate)  MMT Right 04/22/2021  Elbow flexion    Elbow extension    Wrist flexion    Wrist extension    Wrist ulnar deviation    Wrist radial deviation    Wrist pronation    Wrist supination    (Blank rows = not tested)   HAND FUNCTION:  05/19/21: Grip strength: Right: 12 lbs tender; Left: 104 lbs  08/24/21: NT today per protocols    TODAY'S TREATMENT:   09/03/21: He does AROM for review and new measures, shows great improvement in hand and some at wrist as well. OT upgrades HEP to using long-handled tool to facilitate stretches in Pro, Sup, wrist Flex and Ext. He tolerates well and he was also edu how to add weighted hammer stretches in 4 planes next week as tolerated. Full HEP is reviewed as below, he does well, states understanding. OT finishes with manual stretches to wrist in 4 planes, pt tolerating well.   Exercises - Median Nerve Flossing - Tray  - 2-3 x daily - 10 reps - Median Nerve Glide  - 2-3 x daily - 10 reps - Seated Wrist Flexion with Overpressure  - 3-4 x daily - 3-5 reps - 15 sec hold - Wrist Extension Stretch Pronated  - 3-4 x daily - 3-5 reps - 15 hold - Wrist Prayer Stretch  - 3-4 x daily - 3-5 reps - 15 sec hold - ECCENTRIC HAMMER (SLOWLY LET DOWN)   - 2-3 x daily - 3-5 reps - 15 hold - Wrist AROM Flexion Extension  - 4-6 x daily - 10-15  reps - Hand PROM MCP Flexion  - 4-6 x daily - 3-5 reps - 15 hold - Seated Single Digit Intrinsic Stretch  - 4-6 x daily - 3-5 reps - 15-20 sec hold - Tendon Glides  - 3-4 x daily - 5- 10 reps - 2-3 seconds hold   08/24/21: He is re-evaluated (37 minutes of session- new measures of function, sensation, motion, strength, goals updated, plan of care updated, etc.) due to 14 weeks since last seen and underwent new sx for bridge plate removal. He is still appropriate to continue therapy but goals needed updated to reflect current status.   OT goes over full new, updated HEP with him (as listed below) that he has no additional pain with, tolerates well, feels stretches, etc.  OT also discusses home safety and no weight training for ~5 more weeks, no heavy lifting for about 7-8 more weeks, advises to use caution when weaning orthotic and to wear to rest as often as needed or if painful or outside of the home (at work, etc.). He states understanding.  Exercises - Median Nerve Flossing - Tray  - 2-3 x daily - 10 reps - Median Nerve Glide  - 2-3 x daily - 10 reps - Seated Wrist Flexion with Overpressure  - 3-4 x daily - 3-5 reps - 15 sec hold - Wrist Extension Stretch Pronated  - 3-4 x daily - 3-5 reps - 15 hold - Wrist Prayer Stretch  - 3-4 x daily - 3-5 reps - 15 sec hold - Seated Wrist Ulnar Deviation Stretch  - 3-4 x daily - 3-5 reps - 15 sec hold - Seated Wrist Radial Deviation Stretch  - 3-4 x daily - 3-5 reps - 15 hold - Forearm Supination Stretch  - 2-3 x daily - 3-5 reps - 15 sec hold - Standing Forearm Pronation and Supination AROM  - 4-6 x daily - 10-15 reps - Wrist  AROM Radial Ulnar Deviation  - 4-6 x daily - 1 sets - 10-15 reps - Wrist AROM Flexion Extension  - 4-6 x daily - 10-15 reps - Hand PROM MCP Flexion  - 4-6 x daily - 3-5 reps - 15 hold - Seated Single Digit Intrinsic Stretch  - 4-6 x daily - 3-5 reps - 15-20 sec hold - Hand AROM DIP Blocking  - 4-6 x daily - 10 reps - 2 seconds  hold - Tendon Glides  - 3-4 x daily - 5- 10 reps - 2-3 seconds hold - Thumb Opposition  - 2-3 x daily - 10 reps    PATIENT EDUCATION: Education details: HEP, splinting use, care and precautions. Person educated: Patient Education method: Explanation, Demonstration, Verbal cues, and Handouts Education comprehension: verbalized understanding, returned demonstration, and needs further education     HOME EXERCISE PROGRAM: See treatment section above [id number]   ASSESSMENT:   CLINICAL IMPRESSION: 09/03/21: Due to great progress and limited HEP options now, he will come back in 2 weeks (5 weeks post-op) for f/u and check. If all is looking well, we will continue to upgrade. If stiff, will consider dynamic wrist mobilization orthosis.   08/24/21: He is re-evaluated due to 14 weeks since last seen and underwent new sx for bridge plate removal. He is still appropriate to continue therapy per POC set forth at last progress note. Will continue per protocols and pt tolerance. He may need support of dynamic orthotics at wrist and fingers to recapture full motion.   05/19/21: Due to pt understanding HEP, meeting all current goals, having no significant issues, he will be put on hold for OT until spanning bridge plate removed (1-2 more months), then return for re-evaluation and continued therapy as needed. OT will plan for 8 weeks of therapy post-op, per typical protocols.     GOALS: Goals reviewed with patient? No   SHORT TERM GOALS: (STG required if POC>30 days)   STG Name Target Date Goal status  1 Pt will be Mod I splinting use, care and precautions R wrist/forearm Baseline:  05/13/2021 MET  2 Pt will be Mod I initial HEP R UE as seen by ability to perform in clinic Baseline:  05/13/2021 MET and Upgrading HEP  3 Pt will be Mod I edema control techniques as seen by ability to state 1-2 techniques w/o vc's Baseline:  05/13/2021 MET    LONG TERM GOALS:    LTG Name Target Date Goal status  1 Pt  will be Mod I updated HEP for R UE Baseline:  10/16/2021 08/24/21- Updated, understands and will continue progression.   2 Pt will be Mod I scar management and desensitization R forearm/wrist as observed in clinic Baseline:  08/24/2021 08/24/21- Met and understands.  3 Pt will increase TAM of right MF from 204* to at least 230* for tight, full fist ability.  10/16/2021 08/24/21- Goal updated to TAM of middle finger and tight full fist  4 Pt will demonstrate improved grip strength R UE as seen by ability to grip 70# or greater via JAMAR dynamometer Baseline: 100# or more in right hand (comparing to left hand baseline)  10/16/21 08/24/21: Goal updated after bridge plate removal   5 Pt will increase right wrist flex/ext ROM from 20*/25* to at least 45/50* respectively for better reaching and holding of home and work objects.   10/16/21  08/24/21: Goal updated after bridge plate removal   PLAN: OT FREQUENCY: 1-2x/week (up to 16 additional visits(24 in total))  OT DURATION: 8 additional weeks (through 10/16/21)    PLANNED INTERVENTIONS: self care/ADL training, therapeutic exercise, therapeutic activity, scar mobilization, passive range of motion, splinting, ultrasound, fluidotherapy, patient/family education, and DME and/or AE instructions   RECOMMENDED OTHER SERVICES: N/A   CONSULTED AND AGREED WITH PLAN OF CARE: Patient  PLAN FOR NEXT SESSION:  Continue to upgrade as tolerated/per protocols. If wrist very stiff, will consider dynamic wrist mobilization orthosis. Also start more fnl activities in clinic to encourage motion and use.    Benito Mccreedy, OTR/L, CHT 09/03/2021, 12:11 PM

## 2021-09-17 ENCOUNTER — Encounter: Payer: Self-pay | Admitting: Rehabilitative and Restorative Service Providers"

## 2021-09-17 ENCOUNTER — Ambulatory Visit (INDEPENDENT_AMBULATORY_CARE_PROVIDER_SITE_OTHER): Payer: 59 | Admitting: Rehabilitative and Restorative Service Providers"

## 2021-09-17 DIAGNOSIS — M25531 Pain in right wrist: Secondary | ICD-10-CM

## 2021-09-17 DIAGNOSIS — R278 Other lack of coordination: Secondary | ICD-10-CM | POA: Diagnosis not present

## 2021-09-17 DIAGNOSIS — M79641 Pain in right hand: Secondary | ICD-10-CM | POA: Diagnosis not present

## 2021-09-17 DIAGNOSIS — M25641 Stiffness of right hand, not elsewhere classified: Secondary | ICD-10-CM | POA: Diagnosis not present

## 2021-09-17 DIAGNOSIS — M6281 Muscle weakness (generalized): Secondary | ICD-10-CM

## 2021-09-17 DIAGNOSIS — R601 Generalized edema: Secondary | ICD-10-CM | POA: Diagnosis not present

## 2021-09-17 NOTE — Therapy (Signed)
OUTPATIENT OCCUPATIONAL THERAPY TREATMENT NOTE    Patient Name: Stephen [name]. MRN: [id number] DOB:1961/02/16, 61 y.o., male Today's Date: 09/17/2021  PCP: [name], MD REFERRING PROVIDER: [name], MD    OT End of Session - 09/17/21 0932     Visit Number 9    Number of Visits 24    Date for OT Re-Evaluation 10/16/21    Authorization Type [employer]    Progress Note Due on Visit --   Every 10 th visit   OT Start Time [phone]    OT Stop Time 1021    OT Time Calculation (min) 48 min    Activity Tolerance Patient tolerated treatment well;No increased pain;Patient limited by pain;Patient limited by fatigue    Behavior During Therapy [facility] for tasks assessed/performed             Past Medical History:  Diagnosis Date   Anxiety    Colon polyps    Detached retina, right 03/22/1990   Dr. [name] at [facility] did orbital band procedure with good results.    High cholesterol    Hypertension    Poison ivy dermatitis 07/29/2015   Past Surgical History:  Procedure Laterality Date   COLONOSCOPY  04/2007   Dr.Magod   COLONOSCOPY  09/06/2014   Dr.Magod   HARDWARE REMOVAL Right 08/12/2021   Procedure: RIGHT WRIST REMOVAL OF HARDWARE, EXTENSOR TENODESIS;  Surgeon: [name], MD;  Location: [place];  Service: Orthopedics;  Laterality: Right;   OPEN REDUCTION INTERNAL FIXATION (ORIF) DISTAL RADIAL FRACTURE Right 04/08/2021   Procedure: Right OPEN REDUCTION INTERNAL FIXATION (ORIF) DISTAL RADIAL FRACTURE;  Surgeon: [name], MD;  Location: [place];  Service: Orthopedics;  Laterality: Right;   RETINAL DETACHMENT SURGERY     30 years ago   Patient Active Problem List   Diagnosis Date Noted   Retained orthopedic hardware    Closed fracture of right distal radius 04/07/2021   Heme positive stool 08/06/2020   Hordeolum externum of right upper eyelid 08/06/2020   Right hand pain 11/15/2017   Dermatitis due to drug reaction  09/02/2017   Drug-induced erythroderma 09/02/2017   Laryngitis, acute 08/04/2017   Nonallopathic lesion of cervical region 04/06/2017   Nonallopathic lesion of thoracic region 04/06/2017   Nonallopathic lesion of lumbosacral region 04/06/2017   Cervical radiculopathy at C8 03/04/2017   Excessive cerumen in both ear canals 02/15/2017   Neuropathy, cervical plexus 02/15/2017   Poison ivy dermatitis 07/29/2015   Pharyngitis 09/26/2013   Back strain 06/11/2013   Healthcare maintenance 04/25/2011   Mixed hyperlipidemia 03/14/2010   Essential hypertension 03/14/2010    ONSET DATE: 08/12/21 new DOS   REFERRING DIAG: Other closed intra-articular fracture of distal end of right radius, initial encounter   THERAPY DIAG:  Pain in right wrist  Other lack of coordination  Muscle weakness (generalized)  Right hand pain  Generalized edema  Stiffness of right hand, not elsewhere classified   PERTINENT HISTORY:  08/21/21: Per MD: "Patient is now 17 weeks out from his fracture fixation and 9 days out from removal of the plate.  The plate was left in for a prolonged period given the slow fracture healing on CT scans.  His incisions are well well-healed.  He is seeing therapy on Monday to start working on gentle range of motion of the wrist.  I will get an EMG/nerve conduction study given his continued complaints involving the middle finger paresthesias.  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 He has a 2018 dx: cervical plexus neuropathy."  PRECAUTIONS:  He is 5 weeks s/p bridge plate removal after Rt wrist fx. Dynamics to fingers ok PRN, dynamics to wrist ok at week 4 plus weighted stretches & wean from orthotic at home, hand & wrist strength at week 6 as tolerated, return to all  "normal" (not heavy) activities ~ week 8-10    SUBJECTIVE:  He states he's been pushing himself hard, only very mild pain.    PAIN:  Are you having pain? Yes NPRS scale: 1-2/10 in right wrist and FA     OBJECTIVE:  (All objective assessments below are from initial evaluation on: 04/22/21 unless otherwise specified.)    ADLs: Overall ADLs: 05/19/21: Pt is now driving, going to work opening containers, etc., still has mild-moderate problems with higher level tasks however  08/24/21: He states only mild issues with most home tasks but has been avoiding heavy/painful jobs.     FUNCTIONAL OUTCOME MEASURES: Quick Dash Score 27% disability now (down from 61%)   08/24/21: Quick Dash Score  29% Impairment today   SENSATION:  Light touch: Impaired Semmes Weinstein Monofilament scale:  Right hand = 2.83 (normal) in ulnar and median now!   08/24/21: static 2PD:  71m MF, 630mthumb, 71m671mF   COORDINATION: 9 Hole Peg test: Right: NT sec; Left: NT sec Box and Blocks: 55 (was 45) Comments: now within 2 SD's (53) and is WNL,brace not needed today for performance  08/24/21: 9 Hole Peg test: Right: 30 sec; Left: 24.7 sec (decreased in right dominant hand) Box and Blocks: 62 (AVG is 71 Blocks) (WFL, approaching WNL)     EDEMA: mild diffuse swelling about hand still but much better now   08/24/21: 20.6cm circumferentially about right wrist just proximal to base of thumb (compared to 18.8cm in left wrist)   09/03/21: 19.9cm circumferentially about right wrist just proximal to base of thumb   MUSCLE TONE: WNL's     SKIN INTEGRITY:  08/24/21: sx sites look well healing, some bruising and swelling where plate/screws came out.   PALPATION:  08/24/21: mildly TTP to sx sites    UE A/ROM:   A/ROM Rt 05/19/21  RT 08/24/21 Rt  09/03/21 Rt 09/17/21  Elbow flexion WNL     Elbow extension WNL     Wrist flexion 15* 20* 33 30  Wrist extension (-8*) 25* 30 31  Wrist ulnar deviation  26* 20 26  Wrist radial  deviation  5* 9 10  Forearm pronation 73 73* 77 70  Forearm supination 45 47* 54 50  (Blank rows = not tested)   HAND A/ROM: (taking MF measures only as a sample of overall hand motion)    08/24/21: he has near full fist and thumb is tight but fully opposes. MF is still stiffest of fingers and continues to be most numb as well.    A/ROM Right 05/01/2021 Rt  05/19/21 Rt 08/24/21 Rt 09/03/21 Rt 09/17/21  Long MCP (0-90) 0-41  0-60* 0- 57 0-75 0- 74  Long PIP (0-100) 0-69  0-99* (-10*) - 98 (-7) - 87  (-7) - 92  Long DIP (0-70) 0-35 0-65* 0- 59 0- 65 0- 68  (Blank rows = not tested)   UE MMT: (not  tested until resistance appropriate)    MMT Right 04/22/2021  Elbow flexion    Elbow extension    Wrist flexion    Wrist extension    Wrist ulnar deviation    Wrist radial deviation    Wrist pronation    Wrist supination    (Blank rows = not tested)   HAND FUNCTION:  05/19/21: Grip strength: Right: 12 lbs tender; Left: 104 lbs  08/24/21: NT today per protocols    TODAY'S TREATMENT:   09/17/21: He does AROM with review of HEP and measures a bit tight today with only minor improvements. OT does manual IASTM and stretches at wrist in all planes and he does state "moderate" non-painful stretches. He then reviews and performs weighted hammer stretches in 4 planes and has no pain but some difficulty in radial deviation. OT then upgrades him to light hand strength with yellow putty (as listed below) which he tolerates very well like an active stretch. He has no significant pain and no questions at end.   Exercises - Median Nerve Flossing - Tray  - 2-3 x daily - 10 reps - Median Nerve Glide  - 2-3 x daily - 10 reps - Seated Wrist Flexion with Overpressure  - 3-4 x daily - 3-5 reps - 15 sec hold - Wrist Extension Stretch Pronated  - 3-4 x daily - 3-5 reps - 15 hold - Wrist Prayer Stretch  - 3-4 x daily - 3-5 reps - 15 sec hold - ECCENTRIC HAMMER (SLOWLY LET DOWN)   - 2-3 x daily - 3-5 reps - 15  hold - Wrist AROM Flexion Extension  - 4-6 x daily - 10-15 reps - Hand PROM MCP Flexion  - 4-6 x daily - 3-5 reps - 15 hold - Seated Single Digit Intrinsic Stretch  - 4-6 x daily - 3-5 reps - 15-20 sec hold - Tendon Glides  - 3-4 x daily - 5- 10 reps - 2-3 seconds hold - Full Fist  - 2-3 x daily - 5 reps - Seated Claw Fist with Putty  - 2-3 x daily - 5 reps - Finger Extension "Pizza!"   - 2-3 x daily - 5 reps - "Duck Mouth" Strength  - 2-3 x daily - 5 reps - Thumb Press  - 2-3 x daily - 5 reps - Thumb Opposition with Putty  - 2-3 x daily - 5 reps  09/03/21: He does AROM for review and new measures, shows great improvement in hand and some at wrist as well. OT upgrades HEP to using long-handled tool to facilitate stretches in Pro, Sup, wrist Flex and Ext. He tolerates well and he was also edu how to add weighted hammer stretches in 4 planes next week as tolerated. Full HEP is reviewed as below, he does well, states understanding. OT finishes with manual stretches to wrist in 4 planes, pt tolerating well.   Exercises - Median Nerve Flossing - Tray  - 2-3 x daily - 10 reps - Median Nerve Glide  - 2-3 x daily - 10 reps - Seated Wrist Flexion with Overpressure  - 3-4 x daily - 3-5 reps - 15 sec hold - Wrist Extension Stretch Pronated  - 3-4 x daily - 3-5 reps - 15 hold - Wrist Prayer Stretch  - 3-4 x daily - 3-5 reps - 15 sec hold - ECCENTRIC HAMMER (SLOWLY LET DOWN)   - 2-3 x daily - 3-5 reps - 15 hold - Wrist AROM Flexion Extension  - 4-6 x  daily - 10-15 reps - Hand PROM MCP Flexion  - 4-6 x daily - 3-5 reps - 15 hold - Seated Single Digit Intrinsic Stretch  - 4-6 x daily - 3-5 reps - 15-20 sec hold - Tendon Glides  - 3-4 x daily - 5- 10 reps - 2-3 seconds hold   08/24/21: He is re-evaluated (37 minutes of session- new measures of function, sensation, motion, strength, goals updated, plan of care updated, etc.) due to 14 weeks since last seen and underwent new sx for bridge plate removal. He is  still appropriate to continue therapy but goals needed updated to reflect current status.   OT goes over full new, updated HEP with him (as listed below) that he has no additional pain with, tolerates well, feels stretches, etc.  OT also discusses home safety and no weight training for ~5 more weeks, no heavy lifting for about 7-8 more weeks, advises to use caution when weaning orthotic and to wear to rest as often as needed or if painful or outside of the home (at work, etc.). He states understanding.  Exercises - Median Nerve Flossing - Tray  - 2-3 x daily - 10 reps - Median Nerve Glide  - 2-3 x daily - 10 reps - Seated Wrist Flexion with Overpressure  - 3-4 x daily - 3-5 reps - 15 sec hold - Wrist Extension Stretch Pronated  - 3-4 x daily - 3-5 reps - 15 hold - Wrist Prayer Stretch  - 3-4 x daily - 3-5 reps - 15 sec hold - Seated Wrist Ulnar Deviation Stretch  - 3-4 x daily - 3-5 reps - 15 sec hold - Seated Wrist Radial Deviation Stretch  - 3-4 x daily - 3-5 reps - 15 hold - Forearm Supination Stretch  - 2-3 x daily - 3-5 reps - 15 sec hold - Standing Forearm Pronation and Supination AROM  - 4-6 x daily - 10-15 reps - Wrist AROM Radial Ulnar Deviation  - 4-6 x daily - 1 sets - 10-15 reps - Wrist AROM Flexion Extension  - 4-6 x daily - 10-15 reps - Hand PROM MCP Flexion  - 4-6 x daily - 3-5 reps - 15 hold - Seated Single Digit Intrinsic Stretch  - 4-6 x daily - 3-5 reps - 15-20 sec hold - Hand AROM DIP Blocking  - 4-6 x daily - 10 reps - 2 seconds hold - Tendon Glides  - 3-4 x daily - 5- 10 reps - 2-3 seconds hold - Thumb Opposition  - 2-3 x daily - 10 reps    PATIENT EDUCATION: Education details: HEP, splinting use, care and precautions. Person educated: Patient Education method: Explanation, Demonstration, Verbal cues, and Handouts Education comprehension: verbalized understanding, returned demonstration, and needs further education     HOME EXERCISE PROGRAM: See treatment section  above [id number]   ASSESSMENT:   CLINICAL IMPRESSION: 09/17/21: tolerating light hand strength well, but may be unfortunately plateauing on motion. Was encouraged to stretch more often before "giving up on it."   09/03/21: Due to great progress and limited HEP options now, he will come back in 2 weeks (5 weeks post-op) for f/u and check. If all is looking well, we will continue to upgrade. If stiff, will consider dynamic wrist mobilization orthosis.   08/24/21: He is re-evaluated due to 14 weeks since last seen and underwent new sx for bridge plate removal. He is still appropriate to continue therapy per POC set forth at last progress note. Will continue per  protocols and pt tolerance. He may need support of dynamic orthotics at wrist and fingers to recapture full motion.   05/19/21: Due to pt understanding HEP, meeting all current goals, having no significant issues, he will be put on hold for OT until spanning bridge plate removed (1-2 more months), then return for re-evaluation and continued therapy as needed. OT will plan for 8 weeks of therapy post-op, per typical protocols.     GOALS: Goals reviewed with patient? No   SHORT TERM GOALS: (STG required if POC>30 days)   STG Name Target Date Goal status  1 Pt will be Mod I splinting use, care and precautions R wrist/forearm Baseline:  05/13/2021 MET  2 Pt will be Mod I initial HEP R UE as seen by ability to perform in clinic Baseline:  05/13/2021 MET and Upgrading HEP  3 Pt will be Mod I edema control techniques as seen by ability to state 1-2 techniques w/o vc's Baseline:  05/13/2021 MET    LONG TERM GOALS:    LTG Name Target Date Goal status  1 Pt will be Mod I updated HEP for R UE Baseline:  10/16/2021 08/24/21- Updated, understands and will continue progression.   2 Pt will be Mod I scar management and desensitization R forearm/wrist as observed in clinic Baseline:  08/24/2021 08/24/21- Met and understands.  3 Pt will increase TAM of right  MF from 204* to at least 230* for tight, full fist ability.  10/16/2021 08/24/21- Goal updated to TAM of middle finger and tight full fist  4 Pt will demonstrate improved grip strength R UE as seen by ability to grip 70# or greater via JAMAR dynamometer Baseline: 100# or more in right hand (comparing to left hand baseline)  10/16/21 08/24/21: Goal updated after bridge plate removal   5 Pt will increase right wrist flex/ext ROM from 20*/25* to at least 45/50* respectively for better reaching and holding of home and work objects.   10/16/21  08/24/21: Goal updated after bridge plate removal   PLAN: OT FREQUENCY: 1-2x/week (up to 16 additional visits(24 in total))    OT DURATION: 8 additional weeks (through 10/16/21)    PLANNED INTERVENTIONS: self care/ADL training, therapeutic exercise, therapeutic activity, scar mobilization, passive range of motion, splinting, ultrasound, fluidotherapy, patient/family education, and DME and/or AE instructions   RECOMMENDED OTHER SERVICES: N/A   CONSULTED AND AGREED WITH PLAN OF CARE: Patient  PLAN FOR NEXT SESSION:  Upgrade to light wrist strength and try DTM next week, review hand putty strength.    Benito Mccreedy, OTR/L, CHT 09/17/2021, 10:22 AM

## 2021-09-18 ENCOUNTER — Ambulatory Visit (INDEPENDENT_AMBULATORY_CARE_PROVIDER_SITE_OTHER): Payer: 59 | Admitting: Physical Medicine and Rehabilitation

## 2021-09-18 ENCOUNTER — Encounter: Payer: Self-pay | Admitting: Physical Medicine and Rehabilitation

## 2021-09-18 DIAGNOSIS — R202 Paresthesia of skin: Secondary | ICD-10-CM | POA: Diagnosis not present

## 2021-09-18 NOTE — Progress Notes (Signed)
Pt state he has numbness in middle finger after distal radius fracture and has a plate. Pt state his hand feels heavy and numbness in his right index, middle and ring finger.Pt state he takes over the counter pain meds to help ease his pain. Pt state he right handed.  Numeric Pain Rating Scale and Functional Assessment Average Pain 2   In the last MONTH (on 0-10 scale) has pain interfered with the following?  1. General activity like being  able to carry out your everyday physical activities such as walking, climbing stairs, carrying groceries, or moving a chair?  Rating(9)  -BT, -Dye Allergies.

## 2021-09-18 NOTE — Progress Notes (Signed)
Stephen [name]. - 61 y.o. male MRN [id number]  Date of birth: [birth year]  Office Visit Note: Visit Date: 09/18/2021 PCP: [name], MD Referred by: [name], MD  Subjective: Chief Complaint  Patient presents with   Right Hand - Numbness, Pain   HPI:  Stephen [name]. is a 61 y.o. male who comes in today at the request of Dr. [name] for electrodiagnostic study of the Right upper extremities.  Patient is Right hand dominant.  He reports distal radial fracture in January after a fall from a stool moving decorations and boxes.  He underwent ORIF by Dr. Tempie Donning and subsequently plate removal.  He began having sensation of numbness or heaviness in the middle digit and a lot of the heaviness feeling has somewhat gotten better but he continues to have numbness and tingling particular in the middle index and ring finger.  He denies any left-sided complaints.  No frank radicular symptoms although he has a history of radiculopathy noted in the chart at C8 which would not fit this particular pattern.  He sees Dr. Raeford Razor in sports medicine for osteopathic manipulation of his shoulders and neck at times.  No recent MRI of the cervical spine.  No recent electrodiagnostic studies to review.   ROS Otherwise per HPI.  Assessment & Plan: Visit Diagnoses:    ICD-10-CM   1. 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  There is no motor slowing across the carpal tunnel.  This may represent demyelination axonal injury mainly of the sensory fiber aspect of the median nerve which is consistent with his exam and complaints.  There is no significant electrodiagnostic evidence of any other focal nerve entrapment, brachial plexopathy or cervical radiculopathy.   Recommendations: 1.  Follow-up with referring  physician. 2.  Continue current management of symptoms.  Meds & Orders: No orders of the defined types were placed in this encounter.   Orders Placed This Encounter  Procedures   NCV with EMG (electromyography)    Follow-up: Return in about 2 weeks (around 10/02/2021) for [name], MD.   Procedures: No procedures performed  EMG & NCV Findings: Evaluation of the right median (across palm) sensory nerve showed prolonged distal peak latency (Wrist, 4.9 ms), reduced amplitude (6.0 V), and prolonged distal peak latency (Palm, 3.0 ms).  All remaining nerves (as indicated in the following tables) were within normal limits.    All examined muscles (as indicated in the following table) showed no evidence of electrical instability.    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  There is no motor slowing across the carpal tunnel.  This may represent demyelination axonal injury mainly of the sensory fiber aspect of the median nerve which is consistent with his exam and complaints.  There is no significant electrodiagnostic evidence of any other focal nerve entrapment, brachial plexopathy or cervical radiculopathy.   Recommendations: 1.  Follow-up with referring physician. 2.  Continue current management of symptoms.  ___________________________ Laurence Spates FAAPMR Board Certified, American Board of Physical Medicine and Rehabilitation    Nerve Conduction Studies Anti Sensory Summary Table   Stim Site NR Peak (ms) Norm Peak (ms) P-T Amp (V) Norm P-T Amp Site1 Site2 Delta-P (ms) Dist (cm) Vel (m/s) Norm Vel (m/s)  Right  Median Acr Palm Anti Sensory (2nd Digit)  29.6C  Wrist    *4.9 <3.6 *6.0 >10 Wrist Palm 1.9 0.0    Palm    *3.0 <2.0 3.6         Right Radial Anti Sensory (Base 1st Digit)  29.8C  Wrist    2.4 <3.1 14.7  Wrist Base 1st Digit 2.4 0.0    Right Ulnar Anti Sensory  (5th Digit)  29.8C  Wrist    3.7 <3.7 25.0 >15.0 Wrist 5th Digit 3.7 14.0 38 >38   Motor Summary Table   Stim Site NR Onset (ms) Norm Onset (ms) O-P Amp (mV) Norm O-P Amp Site1 Site2 Delta-0 (ms) Dist (cm) Vel (m/s) Norm Vel (m/s)  Right Median Motor (Abd Poll Brev)  29.9C  Wrist    3.9 <4.2 7.7 >5 Elbow Wrist 4.7 23.5 50 >50  Elbow    8.6  6.1         Right Ulnar Motor (Abd Dig Min)  29.7C  Wrist    3.9 <4.2 3.7 >3 B Elbow Wrist 4.1 23.0 56 >53  B Elbow    8.0  7.2  A Elbow B Elbow 1.5 11.0 73 >53  A Elbow    9.5  7.6          EMG   Side Muscle Nerve Root Ins Act Fibs Psw Amp Dur Poly Recrt Int Fraser Din Comment  Right Abd Poll Brev Median C8-T1 Nml Nml Nml Nml Nml 0 Nml Nml   Right 1stDorInt Ulnar C8-T1 Nml Nml Nml Nml Nml 0 Nml Nml   Right PronatorTeres Median C6-7 Nml Nml Nml Nml Nml 0 Nml Nml     Nerve Conduction Studies Anti Sensory Left/Right Comparison   Stim Site L Lat (ms) R Lat (ms) L-R Lat (ms) L Amp (V) R Amp (V) L-R Amp (%) Site1 Site2 L Vel (m/s) R Vel (m/s) L-R Vel (m/s)  Median Acr Palm Anti Sensory (2nd Digit)  29.6C  Wrist  *4.9   *6.0  Wrist Palm     Palm  *3.0   3.6        Radial Anti Sensory (Base 1st Digit)  29.8C  Wrist  2.4   14.7  Wrist Base 1st Digit     Ulnar Anti Sensory (5th Digit)  29.8C  Wrist  3.7   25.0  Wrist 5th Digit  38    Motor Left/Right Comparison   Stim Site L Lat (ms) R Lat (ms) L-R Lat (ms) L Amp (mV) R Amp (mV) L-R Amp (%) Site1 Site2 L Vel (m/s) R Vel (m/s) L-R Vel (m/s)  Median Motor (Abd Poll Brev)  29.9C  Wrist  3.9   7.7  Elbow Wrist  50   Elbow  8.6   6.1        Ulnar Motor (Abd Dig Min)  29.7C  Wrist  3.9   3.7  B Elbow Wrist  56   B Elbow  8.0   7.2  A Elbow B Elbow  73   A Elbow  9.5   7.6           Waveforms:             Clinical History: No specialty comments available.     Objective:  VS:  HT:    WT:   BMI:     BP:   HR: bpm  TEMP: ( )  RESP:  Physical Exam Musculoskeletal:  General:  No tenderness.     Comments: Inspection reveals well-healed surgical scar without any swelling or changes around the scar and no atrophy of the bilateral APB or FDI or hand intrinsics. There is no swelling, color changes, allodynia or dystrophic changes. There is 5 out of 5 strength in the bilateral wrist extension, finger abduction and long finger flexion.  There is impaired sensation in a pretty classic median nerve distribution on the right compared to the left.  Skin:    General: Skin is warm and dry.     Findings: No erythema or rash.  Neurological:     General: No focal deficit present.     Mental Status: He is alert and oriented to person, place, and time.     Sensory: No sensory deficit.     Motor: No weakness or abnormal muscle tone.     Coordination: Coordination normal.     Gait: Gait normal.  Psychiatric:        Mood and Affect: Mood normal.        Behavior: Behavior normal.        Thought Content: Thought content normal.      Imaging: No results found.

## 2021-09-18 NOTE — Procedures (Signed)
EMG & NCV Findings: Evaluation of the right median (across palm) sensory nerve showed prolonged distal peak latency (Wrist, 4.9 ms), reduced amplitude (6.0 V), and prolonged distal peak latency (Palm, 3.0 ms).  All remaining nerves (as indicated in the following tables) were within normal limits.    All examined muscles (as indicated in the following table) showed no evidence of electrical instability.    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  There is no motor slowing across the carpal tunnel.  This may represent demyelination axonal injury mainly of the sensory fiber aspect of the median nerve which is consistent with his exam and complaints.  There is no significant electrodiagnostic evidence of any other focal nerve entrapment, brachial plexopathy or cervical radiculopathy.   Recommendations: 1.  Follow-up with referring physician. 2.  Continue current management of symptoms.  ___________________________ Laurence Spates FAAPMR Board Certified, American Board of Physical Medicine and Rehabilitation    Nerve Conduction Studies Anti Sensory Summary Table   Stim Site NR Peak (ms) Norm Peak (ms) P-T Amp (V) Norm P-T Amp Site1 Site2 Delta-P (ms) Dist (cm) Vel (m/s) Norm Vel (m/s)  Right Median Acr Palm Anti Sensory (2nd Digit)  29.6C  Wrist    *4.9 <3.6 *6.0 >10 Wrist Palm 1.9 0.0    Palm    *3.0 <2.0 3.6         Right Radial Anti Sensory (Base 1st Digit)  29.8C  Wrist    2.4 <3.1 14.7  Wrist Base 1st Digit 2.4 0.0    Right Ulnar Anti Sensory (5th Digit)  29.8C  Wrist    3.7 <3.7 25.0 >15.0 Wrist 5th Digit 3.7 14.0 38 >38   Motor Summary Table   Stim Site NR Onset (ms) Norm Onset (ms) O-P Amp (mV) Norm O-P Amp Site1 Site2 Delta-0 (ms) Dist (cm) Vel (m/s) Norm Vel (m/s)  Right Median Motor (Abd Poll Brev)  29.9C  Wrist    3.9 <4.2 7.7 >5 Elbow Wrist 4.7 23.5 50 >50  Elbow     8.6  6.1         Right Ulnar Motor (Abd Dig Min)  29.7C  Wrist    3.9 <4.2 3.7 >3 B Elbow Wrist 4.1 23.0 56 >53  B Elbow    8.0  7.2  A Elbow B Elbow 1.5 11.0 73 >53  A Elbow    9.5  7.6          EMG   Side Muscle Nerve Root Ins Act Fibs Psw Amp Dur Poly Recrt Int Fraser Din Comment  Right Abd Poll Brev Median C8-T1 Nml Nml Nml Nml Nml 0 Nml Nml   Right 1stDorInt Ulnar C8-T1 Nml Nml Nml Nml Nml 0 Nml Nml   Right PronatorTeres Median C6-7 Nml Nml Nml Nml Nml 0 Nml Nml     Nerve Conduction Studies Anti Sensory Left/Right Comparison   Stim Site L Lat (ms) R Lat (ms) L-R Lat (ms) L Amp (V) R Amp (V) L-R Amp (%) Site1 Site2 L Vel (m/s) R Vel (m/s) L-R Vel (m/s)  Median Acr Palm Anti Sensory (2nd Digit)  29.6C  Wrist  *4.9   *6.0  Wrist Palm     Palm  *3.0   3.6        Radial Anti Sensory (Base 1st Digit)  29.8C  Wrist  2.4   14.7  Wrist Base 1st Digit     Ulnar  Anti Sensory (5th Digit)  29.8C  Wrist  3.7   25.0  Wrist 5th Digit  38    Motor Left/Right Comparison   Stim Site L Lat (ms) R Lat (ms) L-R Lat (ms) L Amp (mV) R Amp (mV) L-R Amp (%) Site1 Site2 L Vel (m/s) R Vel (m/s) L-R Vel (m/s)  Median Motor (Abd Poll Brev)  29.9C  Wrist  3.9   7.7  Elbow Wrist  50   Elbow  8.6   6.1        Ulnar Motor (Abd Dig Min)  29.7C  Wrist  3.9   3.7  B Elbow Wrist  56   B Elbow  8.0   7.2  A Elbow B Elbow  73   A Elbow  9.5   7.6           Waveforms:

## 2021-09-24 ENCOUNTER — Encounter: Payer: Self-pay | Admitting: Rehabilitative and Restorative Service Providers"

## 2021-09-24 ENCOUNTER — Ambulatory Visit (INDEPENDENT_AMBULATORY_CARE_PROVIDER_SITE_OTHER): Payer: 59 | Admitting: Rehabilitative and Restorative Service Providers"

## 2021-09-24 DIAGNOSIS — M25531 Pain in right wrist: Secondary | ICD-10-CM | POA: Diagnosis not present

## 2021-09-24 DIAGNOSIS — M25641 Stiffness of right hand, not elsewhere classified: Secondary | ICD-10-CM | POA: Diagnosis not present

## 2021-09-24 DIAGNOSIS — M79641 Pain in right hand: Secondary | ICD-10-CM | POA: Diagnosis not present

## 2021-09-24 DIAGNOSIS — R601 Generalized edema: Secondary | ICD-10-CM | POA: Diagnosis not present

## 2021-09-24 DIAGNOSIS — M6281 Muscle weakness (generalized): Secondary | ICD-10-CM | POA: Diagnosis not present

## 2021-09-24 DIAGNOSIS — R278 Other lack of coordination: Secondary | ICD-10-CM | POA: Diagnosis not present

## 2021-09-24 NOTE — Therapy (Signed)
OUTPATIENT OCCUPATIONAL THERAPY TREATMENT NOTE    Patient Name: Stephen [name]. MRN: [id number] DOB:04-07-1960, 61 y.o., male Today's Date: 09/24/2021  PCP: [name], MD REFERRING PROVIDER: [name], MD    OT End of Session - 09/24/21 [phone]     Visit Number 10    Number of Visits 24    Date for OT Re-Evaluation 10/16/21    Authorization Type [employer]    Progress Note Due on Visit --   Every 10 th visit   OT Start Time 0935    OT Stop Time 1023    OT Time Calculation (min) 48 min    Activity Tolerance Patient tolerated treatment well;No increased pain;Patient limited by pain;Patient limited by fatigue    Behavior During Therapy [facility] for tasks assessed/performed              Past Medical History:  Diagnosis Date   Anxiety    Colon polyps    Detached retina, right 03/22/1990   Dr. [name] at [facility] did orbital band procedure with good results.    High cholesterol    Hypertension    Poison ivy dermatitis 07/29/2015   Past Surgical History:  Procedure Laterality Date   COLONOSCOPY  04/2007   Dr.Magod   COLONOSCOPY  09/06/2014   Dr.Magod   HARDWARE REMOVAL Right 08/12/2021   Procedure: RIGHT WRIST REMOVAL OF HARDWARE, EXTENSOR TENODESIS;  Surgeon: [name], MD;  Location: [place];  Service: Orthopedics;  Laterality: Right;   OPEN REDUCTION INTERNAL FIXATION (ORIF) DISTAL RADIAL FRACTURE Right 04/08/2021   Procedure: Right OPEN REDUCTION INTERNAL FIXATION (ORIF) DISTAL RADIAL FRACTURE;  Surgeon: [name], MD;  Location: [place];  Service: Orthopedics;  Laterality: Right;   RETINAL DETACHMENT SURGERY     30 years ago   Patient Active Problem List   Diagnosis Date Noted   Retained orthopedic hardware    Closed fracture of right distal radius 04/07/2021   Heme positive stool 08/06/2020   Hordeolum externum of right upper eyelid 08/06/2020   Right hand pain 11/15/2017   Dermatitis due to drug reaction  09/02/2017   Drug-induced erythroderma 09/02/2017   Laryngitis, acute 08/04/2017   Nonallopathic lesion of cervical region 04/06/2017   Nonallopathic lesion of thoracic region 04/06/2017   Nonallopathic lesion of lumbosacral region 04/06/2017   Cervical radiculopathy at C8 03/04/2017   Excessive cerumen in both ear canals 02/15/2017   Neuropathy, cervical plexus 02/15/2017   Poison ivy dermatitis 07/29/2015   Pharyngitis 09/26/2013   Back strain 06/11/2013   Healthcare maintenance 04/25/2011   Mixed hyperlipidemia 03/14/2010   Essential hypertension 03/14/2010    ONSET DATE: 08/12/21 new DOS   REFERRING DIAG: Other closed intra-articular fracture of distal end of right radius, initial encounter   THERAPY DIAG:  Pain in right wrist  Other lack of coordination  Muscle weakness (generalized)  Stiffness of right hand, not elsewhere classified  Generalized edema  Right hand pain   PERTINENT HISTORY:  08/21/21: Per MD: "Patient is now 17 weeks out from his fracture fixation and 9 days out from removal of the plate.  The plate was left in for a prolonged period given the slow fracture healing on CT scans.  His incisions are well well-healed.  He is seeing therapy on Monday to start working on gentle range of motion of the wrist.  I will get an EMG/nerve conduction study given his continued complaints involving the middle finger paresthesias.  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 He has a 2018 dx: cervical plexus neuropathy."  PRECAUTIONS:  He is 6 weeks s/p bridge plate removal after Rt wrist fx. Dynamics to fingers ok PRN, dynamics to wrist ok at week 4 plus weighted stretches & wean from orthotic at home, hand & wrist strength at week 6 as tolerated, return to all  "normal" (not heavy) activities ~ week 8-10    SUBJECTIVE:  He states nerve study found nothing significant. He also says hand strength with putty is easy now    PAIN:  Are you having pain? No NPRS scale: 0/10 in right wrist and FA     OBJECTIVE:  (All objective assessments below are from initial evaluation on: 04/22/21 unless otherwise specified.)    ADLs: Overall ADLs: 05/19/21: Pt is now driving, going to work opening containers, etc., still has mild-moderate problems with higher level tasks however  08/24/21: He states only mild issues with most home tasks but has been avoiding heavy/painful jobs.     FUNCTIONAL OUTCOME MEASURES: Quick Dash Score 27% disability now (down from 61%)   08/24/21: Quick Dash Score  29% Impairment today   SENSATION:  Light touch: Impaired Semmes Weinstein Monofilament scale:  Right hand = 2.83 (normal) in ulnar and median now!   08/24/21: static 2PD:  66m MF, 680mthumb, 48m64mF   COORDINATION: 9 Hole Peg test: Right: NT sec; Left: NT sec Box and Blocks: 55 (was 45) Comments: now within 2 SD's (53) and is WNL,brace not needed today for performance  08/24/21: 9 Hole Peg test: Right: 30 sec; Left: 24.7 sec (decreased in right dominant hand) Box and Blocks: 62 (AVG is 71 Blocks) (WFL, approaching WNL)     EDEMA: mild diffuse swelling about hand still but much better now   08/24/21: 20.6cm circumferentially about right wrist just proximal to base of thumb (compared to 18.8cm in left wrist)   09/03/21: 19.9cm circumferentially about right wrist just proximal to base of thumb   MUSCLE TONE: WNL's     SKIN INTEGRITY:  08/24/21: sx sites look well healing, some bruising and swelling where plate/screws came out.   PALPATION:  08/24/21: mildly TTP to sx sites    UE A/ROM:   A/ROM Rt 05/19/21  RT 08/24/21 Rt  09/03/21 Rt 09/17/21  Elbow flexion WNL     Elbow extension WNL     Wrist flexion 15* 20* 33 30  Wrist extension (-8*) 25* 30 31  Wrist ulnar  deviation  26* 20 26  Wrist radial deviation  5* 9 10  Forearm pronation 73 73* 77 70  Forearm supination 45 47* 54 50  (Blank rows = not tested)   HAND A/ROM: (taking MF measures only as a sample of overall hand motion)    08/24/21: he has near full fist and thumb is tight but fully opposes. MF is still stiffest of fingers and continues to be most numb as well.    A/ROM Right 05/01/2021 Rt  05/19/21 Rt 08/24/21 Rt 09/03/21 Rt 09/17/21  Long MCP (0-90) 0-41  0-60* 0- 57 0-75 0- 74  Long PIP (0-100) 0-69  0-99* (-10*) - 98 (-7) - 87  (-7) - 92  Long DIP (0-70) 0-35 0-65* 0- 59 0- 65 0- 68  (Blank rows =  not tested)   UE MMT:   09/24/21: he demo's at least 4-/5 MMT at wrist and elbow now but in a very reduced ROM.    MMT Right TBD  Elbow flexion    Elbow extension    Wrist flexion    Wrist extension    Wrist ulnar deviation    Wrist radial deviation    Wrist pronation    Wrist supination    (Blank rows = not tested)   HAND FUNCTION:  05/19/21: Grip strength: Right: 12 lbs tender; Left: 104 lbs  08/24/21: NT today per protocols    TODAY'S TREATMENT:   09/24/21: OT reviews hand strength with t-putty, upgrades to green resistance.  OT also upgrades HEP to PRE at wrist and elbow as tolerated (as below). He does well with all, no pain but still very stiff especially in wrist ext). Due to stubborn motion, OT starts fabricating dynamic wrist orthosis (mobilization) but is unable to complete due to starting later in session. It will be finished next session.  He was edu to wean from his orthotic now for all tasks but heavy tasks.   Exercises - Median Nerve Flossing - Tray  - 1-2 x daily - 5 reps - Seated Wrist Flexion with Overpressure  - 3-4 x daily - 3-5 reps - 15 sec hold - Wrist Prayer Stretch  - 3-4 x daily - 3-5 reps - 15 sec hold - ECCENTRIC HAMMER (SLOWLY LET DOWN)   - 2-3 x daily - 3-5 reps - 15 hold - Full Fist  - 2-3 x daily - 5 reps - Seated Claw Fist with Putty  - 2-3 x daily -  5 reps - Finger Extension "Pizza!"   - 2-3 x daily - 5 reps - "Duck Mouth" Strength  - 2-3 x daily - 5 reps - Thumb Press  - 2-3 x daily - 5 reps - Thumb Opposition with Putty  - 2-3 x daily - 5 reps - Wrist Extension with Resistance  - 2-3 x daily - 1 sets - 10-15 reps - Wrist Flexion with Resistance  - 2-3 x daily - 1 sets - 10-15 reps - Wrist AROM Dart Throwers Motion with Resistance  - 2-3 x daily - 10 reps    PATIENT EDUCATION: Education details: HEP, splinting use, care and precautions. Person educated: Patient Education method: Explanation, Demonstration, Verbal cues, and Handouts Education comprehension: verbalized understanding, returned demonstration, and needs further education     HOME EXERCISE PROGRAM: See treatment section above [id number]   ASSESSMENT:   CLINICAL IMPRESSION: 09/24/21: He is tolerating PRE well now, wrist is staying very stiff, unfortunately, so OT started fabricating dynamic wrist mobilization orthotic today. It will be completed next session. OT will also perform more manual joint mobs as tolerated to try to help with stiffness.    GOALS: Goals reviewed with patient? No   SHORT TERM GOALS: (STG required if POC>30 days)   STG Name Target Date Goal status  1 Pt will be Mod I splinting use, care and precautions R wrist/forearm Baseline:  05/13/2021 MET  2 Pt will be Mod I initial HEP R UE as seen by ability to perform in clinic Baseline:  05/13/2021 MET and Upgrading HEP  3 Pt will be Mod I edema control techniques as seen by ability to state 1-2 techniques w/o vc's Baseline:  05/13/2021 MET    LONG TERM GOALS:    LTG Name Target Date Goal status  1 Pt will be Mod I updated  HEP for R UE Baseline:  10/16/2021 08/24/21- Updated, understands and will continue progression.   2 Pt will be Mod I scar management and desensitization R forearm/wrist as observed in clinic Baseline:  08/24/2021 08/24/21- Met and understands.  3 Pt will increase TAM of right MF  from 204* to at least 230* for tight, full fist ability.  10/16/2021 08/24/21- Goal updated to TAM of middle finger and tight full fist  4 Pt will demonstrate improved grip strength R UE as seen by ability to grip 70# or greater via JAMAR dynamometer Baseline: 100# or more in right hand (comparing to left hand baseline)  10/16/21 08/24/21: Goal updated after bridge plate removal   5 Pt will increase right wrist flex/ext ROM from 20*/25* to at least 45/50* respectively for better reaching and holding of home and work objects.   10/16/21  08/24/21: Goal updated after bridge plate removal   PLAN: OT FREQUENCY: 1-2x/week (up to 16 additional visits(24 in total))    OT DURATION: 8 additional weeks (through 10/16/21)    PLANNED INTERVENTIONS: self care/ADL training, therapeutic exercise, therapeutic activity, scar mobilization, passive range of motion, splinting, ultrasound, fluidotherapy, patient/family education, and DME and/or AE instructions   RECOMMENDED OTHER SERVICES: N/A   CONSULTED AND AGREED WITH PLAN OF CARE: Patient  PLAN FOR NEXT SESSION:  Finish dynamic orthosis for wrist flexion/ext, if time do manual joint mobs and "push" for wrist motion. Review new wrist PRE as well and start whole arm strength as tol as well.   Benito Mccreedy, OTR/L, CHT 09/24/2021, 5:55 PM

## 2021-09-28 ENCOUNTER — Ambulatory Visit (INDEPENDENT_AMBULATORY_CARE_PROVIDER_SITE_OTHER): Payer: 59 | Admitting: Orthopedic Surgery

## 2021-09-28 DIAGNOSIS — G5611 Other lesions of median nerve, right upper limb: Secondary | ICD-10-CM | POA: Insufficient documentation

## 2021-09-28 DIAGNOSIS — S52571A Other intraarticular fracture of lower end of right radius, initial encounter for closed fracture: Secondary | ICD-10-CM

## 2021-09-28 NOTE — Progress Notes (Signed)
Post-Op Visit Note   Patient: Stephen [name].           Date of Birth: 1960/12/08           MRN: [id number] Visit Date: 09/28/2021 PCP: [name], MD   Assessment & Plan:  Chief Complaint:  Chief Complaint  Patient presents with   Right Wrist - Follow-up    EMG/NCS   Visit Diagnoses:  1. Other closed intra-articular fracture of distal end of right radius, initial encounter   2. Median nerve neuropathy, right     Plan: Patient is 6.5 weeks s/p removal of right distal radius bridge plate with extensor tenolysis.  He's doing well postoperatively.  He is gaining wrist ROM with therapy.  He can make a full, tight fist.  The numbness in the middle finger is continuing to improve. It previously involved the entire middle finger but not only involves the finger tip.  We reviewed his recent EMG/NCS which suggested a sensory neuropathy of the middle finger with preservation of the motor function.  Given his continued improvement in sensation, we will continue to observe his symptoms for now.  I can see him back in another month or so with therapy to assess his range of motion.   Follow-Up Instructions: No follow-ups on file.   Orders:  No orders of the defined types were placed in this encounter.  No orders of the defined types were placed in this encounter.   Imaging: No results found.  PMFS History: Patient Active Problem List   Diagnosis Date Noted   Median nerve neuropathy, right 09/28/2021   Retained orthopedic hardware    Closed fracture of right distal radius 04/07/2021   Heme positive stool 08/06/2020   Hordeolum externum of right upper eyelid 08/06/2020   Right hand pain 11/15/2017   Dermatitis due to drug reaction 09/02/2017   Drug-induced erythroderma 09/02/2017   Laryngitis, acute 08/04/2017   Nonallopathic lesion of cervical region 04/06/2017   Nonallopathic lesion of thoracic region 04/06/2017   Nonallopathic lesion of lumbosacral region  04/06/2017   Cervical radiculopathy at C8 03/04/2017   Excessive cerumen in both ear canals 02/15/2017   Neuropathy, cervical plexus 02/15/2017   Poison ivy dermatitis 07/29/2015   Pharyngitis 09/26/2013   Back strain 06/11/2013   Healthcare maintenance 04/25/2011   Mixed hyperlipidemia 03/14/2010   Essential hypertension 03/14/2010   Past Medical History:  Diagnosis Date   Anxiety    Colon polyps    Detached retina, right 03/22/1990   Dr. [name] at [facility] did orbital band procedure with good results.    High cholesterol    Hypertension    Poison ivy dermatitis 07/29/2015    Family History  Problem Relation Age of Onset   Parkinsonism Mother    Colon polyps Father    Colon cancer Father 80   Cancer Father        colon cancer with mets   Hyperlipidemia Father    Hypertension Father    Hypertension Brother    Diabetes Neg Hx    Heart disease Neg Hx    Esophageal cancer Neg Hx    Rectal cancer Neg Hx    Stomach cancer Neg Hx     Past Surgical History:  Procedure Laterality Date   COLONOSCOPY  04/2007   Dr.Magod   COLONOSCOPY  09/06/2014   Dr.Magod   HARDWARE REMOVAL Right 08/12/2021   Procedure: RIGHT WRIST REMOVAL OF HARDWARE, EXTENSOR TENODESIS;  Surgeon: [name], MD;  Location:  [place];  Service: Orthopedics;  Laterality: Right;   OPEN REDUCTION INTERNAL FIXATION (ORIF) DISTAL RADIAL FRACTURE Right 04/08/2021   Procedure: Right OPEN REDUCTION INTERNAL FIXATION (ORIF) DISTAL RADIAL FRACTURE;  Surgeon: [name], MD;  Location: [place];  Service: Orthopedics;  Laterality: Right;   RETINAL DETACHMENT SURGERY     30 years ago   Social History   Occupational History   Occupation: busines    Employer: [employer]  Tobacco Use   Smoking status: Never   Smokeless tobacco: Never  Vaping Use   Vaping Use: Never used  Substance and Sexual Activity   Alcohol use: Yes    Comment: occ beer   Drug use: No   Sexual activity: Yes     Partners: Female

## 2021-10-02 ENCOUNTER — Encounter: Payer: Self-pay | Admitting: Rehabilitative and Restorative Service Providers"

## 2021-10-02 ENCOUNTER — Ambulatory Visit (INDEPENDENT_AMBULATORY_CARE_PROVIDER_SITE_OTHER): Payer: 59 | Admitting: Rehabilitative and Restorative Service Providers"

## 2021-10-02 DIAGNOSIS — M25531 Pain in right wrist: Secondary | ICD-10-CM

## 2021-10-02 DIAGNOSIS — M25641 Stiffness of right hand, not elsewhere classified: Secondary | ICD-10-CM

## 2021-10-02 DIAGNOSIS — R601 Generalized edema: Secondary | ICD-10-CM

## 2021-10-02 DIAGNOSIS — M6281 Muscle weakness (generalized): Secondary | ICD-10-CM | POA: Diagnosis not present

## 2021-10-02 DIAGNOSIS — R278 Other lack of coordination: Secondary | ICD-10-CM | POA: Diagnosis not present

## 2021-10-02 DIAGNOSIS — M79641 Pain in right hand: Secondary | ICD-10-CM

## 2021-10-02 NOTE — Therapy (Signed)
OUTPATIENT OCCUPATIONAL THERAPY TREATMENT NOTE    Patient Name: Stephen Eroh Jr. MRN: [id number] DOB:11/22/1960, 61 y.o., male Today's Date: 10/02/2021  PCP: Kremer, William Alfred, MD REFERRING PROVIDER: No ref. provider found    OT End of Session - 10/02/21 0806     Visit Number 11    Number of Visits 24    Date for OT Re-Evaluation 10/16/21    Authorization Type [employer]    Progress Note Due on Visit --   Every 10 th visit   OT Start Time 0804    OT Stop Time 0853    OT Time Calculation (min) 49 min    Activity Tolerance Patient tolerated treatment well;No increased pain;Patient limited by pain;Patient limited by fatigue    Behavior During Therapy WFL for tasks assessed/performed               Past Medical History:  Diagnosis Date   Anxiety    Colon polyps    Detached retina, right 03/22/1990   Dr. Jaffey at [facility] did orbital band procedure with good results.    High cholesterol    Hypertension    Poison ivy dermatitis 07/29/2015   Past Surgical History:  Procedure Laterality Date   COLONOSCOPY  04/2007   Dr.Magod   COLONOSCOPY  09/06/2014   Dr.Magod   HARDWARE REMOVAL Right 08/12/2021   Procedure: RIGHT WRIST REMOVAL OF HARDWARE, EXTENSOR TENODESIS;  Surgeon: Benfield, Charlie, MD;  Location: [facility];  Service: Orthopedics;  Laterality: Right;   OPEN REDUCTION INTERNAL FIXATION (ORIF) DISTAL RADIAL FRACTURE Right 04/08/2021   Procedure: Right OPEN REDUCTION INTERNAL FIXATION (ORIF) DISTAL RADIAL FRACTURE;  Surgeon: Benfield, Charlie, MD;  Location: [facility];  Service: Orthopedics;  Laterality: Right;   RETINAL DETACHMENT SURGERY     30 years ago   Patient Active Problem List   Diagnosis Date Noted   Median nerve neuropathy, right 09/28/2021   Retained orthopedic hardware    Closed fracture of right distal radius 04/07/2021   Heme positive stool 08/06/2020   Hordeolum externum of right upper eyelid 08/06/2020   Right hand  pain 11/15/2017   Dermatitis due to drug reaction 09/02/2017   Drug-induced erythroderma 09/02/2017   Laryngitis, acute 08/04/2017   Nonallopathic lesion of cervical region 04/06/2017   Nonallopathic lesion of thoracic region 04/06/2017   Nonallopathic lesion of lumbosacral region 04/06/2017   Cervical radiculopathy at C8 03/04/2017   Excessive cerumen in both ear canals 02/15/2017   Neuropathy, cervical plexus 02/15/2017   Poison ivy dermatitis 07/29/2015   Pharyngitis 09/26/2013   Back strain 06/11/2013   Healthcare maintenance 04/25/2011   Mixed hyperlipidemia 03/14/2010   Essential hypertension 03/14/2010    ONSET DATE: 08/12/21 new DOS   REFERRING DIAG: Other closed intra-articular fracture of distal end of right radius, initial encounter   THERAPY DIAG:  Pain in right wrist  Generalized edema  Muscle weakness (generalized)  Other lack of coordination  Right hand pain  Stiffness of right hand, not elsewhere classified   PERTINENT HISTORY:  08/21/21: Per MD: "Patient is now 17 weeks out from his fracture fixation and 9 days out from removal of the plate.  The plate was left in for a prolonged period given the slow fracture healing on CT scans.  His incisions are well well-healed.  He is seeing therapy on Monday to start working on gentle range of motion of the wrist.  I will get an EMG/nerve conduction study given his continued complaints involving   the middle finger paresthesias.  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 He has a 2018 dx: cervical plexus neuropathy."  PRECAUTIONS:  He is 7 weeks s/p bridge plate removal after Rt wrist fx. Dynamics to fingers ok PRN, dynamics to wrist ok at week 4 plus weighted stretches & wean from orthotic at home, hand & wrist  strength at week 6 as tolerated, return to all "normal" (not heavy) activities ~ week 8-10    SUBJECTIVE:  He states managing HEP with new strength well, and it feels beneficial.   PAIN:  Are you having pain? No NPRS scale: 0/10 in right wrist and FA     OBJECTIVE:  (All objective assessments below are from initial evaluation on: 04/22/21 unless otherwise specified.)    ADLs: Overall ADLs: 05/19/21: Pt is now driving, going to work opening containers, etc., still has mild-moderate problems with higher level tasks however  08/24/21: He states only mild issues with most home tasks but has been avoiding heavy/painful jobs.     FUNCTIONAL OUTCOME MEASURES: Quick Dash Score 27% disability now (down from 61%)   08/24/21: Quick Dash Score  29% Impairment today   SENSATION:  Light touch: Impaired Semmes Weinstein Monofilament scale:  Right hand = 2.83 (normal) in ulnar and median now!   08/24/21: static 2PD:  642m MF, 659mthumb, 42m74mF   COORDINATION: 9 Hole Peg test: Right: NT sec; Left: NT sec Box and Blocks: 55 (was 45) Comments: now within 2 SD's (53) and is WNL,brace not needed today for performance  08/24/21: 9 Hole Peg test: Right: 30 sec; Left: 24.7 sec (decreased in right dominant hand) Box and Blocks: 62 (AVG is 71 Blocks) (WFL, approaching WNL)     EDEMA: mild diffuse swelling about hand still but much better now   08/24/21: 20.6cm circumferentially about right wrist just proximal to base of thumb (compared to 18.8cm in left wrist)   09/03/21: 19.9cm circumferentially about right wrist just proximal to base of thumb   MUSCLE TONE: WNL's     SKIN INTEGRITY:  08/24/21: sx sites look well healing, some bruising and swelling where plate/screws came out.   PALPATION:  08/24/21: mildly TTP to sx sites    UE A/ROM:   A/ROM Rt 05/19/21  RT 08/24/21 Rt  09/03/21 Rt 09/17/21 Rt  10/02/21  Elbow flexion WNL      Elbow extension WNL      Wrist flexion 15* 20* 33 30 38  Wrist extension  (-8*) 25* 30 31 39  Wrist ulnar deviation  26* 20 26   Wrist radial deviation  5* 9 10   Forearm pronation 73 73* 77 70   Forearm supination 45 47* 54 50   (Blank rows = not tested)   HAND A/ROM: (taking MF measures only as a sample of overall hand motion)    08/24/21: he has near full fist and thumb is tight but fully opposes. MF is still stiffest of fingers and continues to be most numb as well.    A/ROM Right 05/01/2021 Rt  05/19/21 Rt 08/24/21 Rt 09/03/21 Rt 09/17/21  Long MCP (0-90) 0-41  0-60* 0- 57 0-75 0- 74  Long PIP (0-100) 0-69  0-99* (-10*) - 98 (-7) - 87  (-7) - 92  Long  DIP (0-70) 0-35 0-65* 0- 59 0- 65 0- 68  (Blank rows = not tested)   UE MMT:  09/24/21: he demo's at least 4-/5 MMT at wrist and elbow now but in a very reduced ROM.    MMT Right 10/02/21  Elbow flexion    Elbow extension    Wrist flexion 4+/5   Wrist extension 4+/5   Wrist ulnar deviation    Wrist radial deviation    Wrist pronation    Wrist supination    (Blank rows = not tested)   HAND FUNCTION: 10/02/21: Grip strength: Right: 55 lbs. Not painful   05/19/21: Grip strength: Right: 12 lbs tender; Left: 104 lbs     TODAY'S TREATMENT:   10/02/21: His motion has improved since starting strength and strength testing feels better and stable today. OT then finished complex, dynamic flexion or extension orthotic for him to use 3x day for 15 mins periods. He tolerates well in clinic, no pain feels good stretches. OT also reviews new wrist strength with him and recommends upgrading to light hand weights in next 1-2 weeks as tolerated. He states understanding .   09/24/21: OT reviews hand strength with t-putty, upgrades to green resistance.  OT also upgrades HEP to PRE at wrist and elbow as tolerated (as below). He does well with all, no pain but still very stiff especially in wrist ext). Due to stubborn motion, OT starts fabricating dynamic wrist orthosis (mobilization) but is unable to complete due to starting  later in session. It will be finished next session.  He was edu to wean from his orthotic now for all tasks but heavy tasks.   Exercises - Median Nerve Flossing - Tray  - 1-2 x daily - 5 reps - Seated Wrist Flexion with Overpressure  - 3-4 x daily - 3-5 reps - 15 sec hold - Wrist Prayer Stretch  - 3-4 x daily - 3-5 reps - 15 sec hold - ECCENTRIC HAMMER (SLOWLY LET DOWN)   - 2-3 x daily - 3-5 reps - 15 hold - Full Fist  - 2-3 x daily - 5 reps - Seated Claw Fist with Putty  - 2-3 x daily - 5 reps - Finger Extension "Pizza!"   - 2-3 x daily - 5 reps - "Duck Mouth" Strength  - 2-3 x daily - 5 reps - Thumb Press  - 2-3 x daily - 5 reps - Thumb Opposition with Putty  - 2-3 x daily - 5 reps - Wrist Extension with Resistance  - 2-3 x daily - 1 sets - 10-15 reps - Wrist Flexion with Resistance  - 2-3 x daily - 1 sets - 10-15 reps - Wrist AROM Dart Throwers Motion with Resistance  - 2-3 x daily - 10 reps    PATIENT EDUCATION: Education details: HEP, splinting use, care and precautions. Person educated: Patient Education method: Explanation, Demonstration, Verbal cues, and Handouts Education comprehension: verbalized understanding, returned demonstration, and needs further education     HOME EXERCISE PROGRAM: See treatment section above [id number]   ASSESSMENT:   CLINICAL IMPRESSION: 10/02/21: Moving better, stronger, more stable, still pain free- he will try self-management for next 3-4 weeks, then return for f/u reassessment of status  09/24/21: He is tolerating PRE well now, wrist is staying very stiff, unfortunately, so OT started fabricating dynamic wrist mobilization orthotic today. It will be completed next session. OT will also perform more manual joint mobs as tolerated to try to help with stiffness.    GOALS:  Goals reviewed with patient? No   SHORT TERM GOALS: (STG required if POC>30 days)   STG Name Target Date Goal status  1 Pt will be Mod I splinting use, care and precautions  R wrist/forearm Baseline:  05/13/2021 MET  2 Pt will be Mod I initial HEP R UE as seen by ability to perform in clinic Baseline:  05/13/2021 MET and Upgrading HEP  3 Pt will be Mod I edema control techniques as seen by ability to state 1-2 techniques w/o vc's Baseline:  05/13/2021 MET    LONG TERM GOALS:    LTG Name Target Date Goal status  1 Pt will be Mod I updated HEP for R UE Baseline:  10/16/2021 08/24/21- Updated, understands and will continue progression.   2 Pt will be Mod I scar management and desensitization R forearm/wrist as observed in clinic Baseline:  08/24/2021 08/24/21- Met and understands.  3 Pt will increase TAM of right MF from 204* to at least 230* for tight, full fist ability.  10/16/2021 08/24/21- Goal updated to TAM of middle finger and tight full fist  4 Pt will demonstrate improved grip strength R UE as seen by ability to grip 70# or greater via JAMAR dynamometer Baseline: 100# or more in right hand (comparing to left hand baseline)  10/16/21 08/24/21: Goal updated after bridge plate removal   5 Pt will increase right wrist flex/ext ROM from 20*/25* to at least 45/50* respectively for better reaching and holding of home and work objects.   10/16/21  08/24/21: Goal updated after bridge plate removal   PLAN: OT FREQUENCY: 1-2x/week (up to 16 additional visits(24 in total))    OT DURATION: 8 additional weeks (through 10/16/21)    PLANNED INTERVENTIONS: self care/ADL training, therapeutic exercise, therapeutic activity, scar mobilization, passive range of motion, splinting, ultrasound, fluidotherapy, patient/family education, and DME and/or AE instructions   RECOMMENDED OTHER SERVICES: N/A   CONSULTED AND AGREED WITH PLAN OF CARE: Patient  PLAN FOR NEXT SESSION:  Reassess needs after 3-4 weeks of self-management.    Benito Mccreedy, OTR/L, CHT 10/02/2021, 1:16 PM

## 2021-10-21 ENCOUNTER — Encounter: Payer: 59 | Admitting: Family Medicine

## 2021-10-28 DIAGNOSIS — H5211 Myopia, right eye: Secondary | ICD-10-CM | POA: Diagnosis not present

## 2021-10-28 DIAGNOSIS — H5202 Hypermetropia, left eye: Secondary | ICD-10-CM | POA: Diagnosis not present

## 2021-10-28 DIAGNOSIS — H52223 Regular astigmatism, bilateral: Secondary | ICD-10-CM | POA: Diagnosis not present

## 2021-10-28 DIAGNOSIS — H524 Presbyopia: Secondary | ICD-10-CM | POA: Diagnosis not present

## 2021-10-29 ENCOUNTER — Encounter: Payer: Self-pay | Admitting: Rehabilitative and Restorative Service Providers"

## 2021-10-29 ENCOUNTER — Ambulatory Visit (INDEPENDENT_AMBULATORY_CARE_PROVIDER_SITE_OTHER): Payer: 59 | Admitting: Rehabilitative and Restorative Service Providers"

## 2021-10-29 DIAGNOSIS — R601 Generalized edema: Secondary | ICD-10-CM

## 2021-10-29 DIAGNOSIS — R278 Other lack of coordination: Secondary | ICD-10-CM | POA: Diagnosis not present

## 2021-10-29 DIAGNOSIS — M25641 Stiffness of right hand, not elsewhere classified: Secondary | ICD-10-CM | POA: Diagnosis not present

## 2021-10-29 DIAGNOSIS — M6281 Muscle weakness (generalized): Secondary | ICD-10-CM

## 2021-10-29 DIAGNOSIS — M25531 Pain in right wrist: Secondary | ICD-10-CM

## 2021-10-29 DIAGNOSIS — M79641 Pain in right hand: Secondary | ICD-10-CM

## 2021-10-29 DIAGNOSIS — M25631 Stiffness of right wrist, not elsewhere classified: Secondary | ICD-10-CM

## 2021-10-29 NOTE — Therapy (Signed)
OUTPATIENT OCCUPATIONAL THERAPY TREATMENT & DISCHARGE NOTE    Patient Name: Stephen [name]. MRN: [id number] DOB:[birth year], 61 y.o., male Today's Date: 10/29/2021  PCP: [name], MD REFERRING PROVIDER: Dr. [name]   Progress Note Reporting Period 08/24/21 to 10/29/21.   See note below for Objective Data and Assessment of Progress/Goals.        OT End of Session - 10/29/21 0839     Visit Number 12    Number of Visits 24    Date for OT Re-Evaluation 10/29/21    Authorization Type [employer]    Progress Note Due on Visit --   Every 10 th visit   OT Start Time 0840    OT Stop Time 0919    OT Time Calculation (min) 39 min    Activity Tolerance Patient tolerated treatment well;No increased pain    Behavior During Therapy [facility] for tasks assessed/performed              Past Medical History:  Diagnosis Date   Anxiety    Colon polyps    Detached retina, right 03/22/1990   Dr. [name] at [facility] did orbital band procedure with good results.    High cholesterol    Hypertension    Poison ivy dermatitis 07/29/2015   Past Surgical History:  Procedure Laterality Date   COLONOSCOPY  04/2007   Dr.Magod   COLONOSCOPY  09/06/2014   Dr.Magod   HARDWARE REMOVAL Right 08/12/2021   Procedure: RIGHT WRIST REMOVAL OF HARDWARE, EXTENSOR TENODESIS;  Surgeon: [name], MD;  Location: [place];  Service: Orthopedics;  Laterality: Right;   OPEN REDUCTION INTERNAL FIXATION (ORIF) DISTAL RADIAL FRACTURE Right 04/08/2021   Procedure: Right OPEN REDUCTION INTERNAL FIXATION (ORIF) DISTAL RADIAL FRACTURE;  Surgeon: [name], MD;  Location: [place];  Service: Orthopedics;  Laterality: Right;   RETINAL DETACHMENT SURGERY     30 years ago   Patient Active Problem List   Diagnosis Date Noted   Median nerve neuropathy, right 09/28/2021   Retained orthopedic hardware    Closed fracture of right distal radius 04/07/2021   Heme  positive stool 08/06/2020   Hordeolum externum of right upper eyelid 08/06/2020   Right hand pain 11/15/2017   Dermatitis due to drug reaction 09/02/2017   Drug-induced erythroderma 09/02/2017   Laryngitis, acute 08/04/2017   Nonallopathic lesion of cervical region 04/06/2017   Nonallopathic lesion of thoracic region 04/06/2017   Nonallopathic lesion of lumbosacral region 04/06/2017   Cervical radiculopathy at C8 03/04/2017   Excessive cerumen in both ear canals 02/15/2017   Neuropathy, cervical plexus 02/15/2017   Poison ivy dermatitis 07/29/2015   Pharyngitis 09/26/2013   Back strain 06/11/2013   Healthcare maintenance 04/25/2011   Mixed hyperlipidemia 03/14/2010   Essential hypertension 03/14/2010    ONSET DATE: 08/12/21 new DOS   REFERRING DIAG: Other closed intra-articular fracture of distal end of right radius, initial encounter   THERAPY DIAG:  Pain in right wrist  Generalized edema  Muscle weakness (generalized)  Other lack of coordination  Right hand pain  Stiffness of right hand, not elsewhere classified   PERTINENT HISTORY:  08/21/21: Per MD: "Patient is now 17 weeks out from his fracture fixation and 9 days out from removal of the plate.  The plate was left in for a prolonged period given the slow fracture healing on CT scans.  His incisions are well well-healed.  He is seeing therapy on Monday to start working on gentle  range of motion of the wrist.  I will get an EMG/nerve conduction study given his continued complaints involving the middle finger paresthesias.  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 He has a 2018 dx: cervical plexus neuropathy."  PRECAUTIONS:  He is ~10 weeks s/p bridge plate removal after Rt wrist fx. 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 He states no work issues at all. He states only wearing dynamic wrist flexion/extension orthotic "a couple times" after OT made it, and he seems to allude that he has stopped home exercises in favor of a busy life schedule.    PAIN:  Are you having pain? No  NPRS scale: 0/10 in right wrist and FA     OBJECTIVE:  (All objective assessments below are from initial evaluation on: 04/22/21 unless otherwise specified.)    ADLs: 10/29/21: Nothing significant now unless very heavy.     FUNCTIONAL OUTCOME MEASURES: 10/29/21: Katina Dung Score <10% Impairment today  08/24/21: Mikael Spray Dash Score  29% Impairment today   SENSATION:  10/29/21: static 2PD:  31m MF, 332mthumb, 65m70mF (WNL)    COORDINATION: 10/29/21: now WFL, no c/o   EDEMA:  10/29/21: none significant now   MUSCLE TONE: WNL     SKIN INTEGRITY:  10/29/21: WNL now  PALPATION:  10/29/21:  no issues   UE A/ROM:   A/ROM Rt 05/19/21  Rt  10/02/21 Rt 10/29/21  Elbow flexion WNL    Elbow extension WNL    Wrist flexion 15* 38 34  Wrist extension (-8*) 39 40  Wrist ulnar deviation     Wrist radial deviation     Forearm pronation 73    Forearm supination 45    (Blank rows = not tested)   HAND A/ROM: (taking MF measures only as a sample of overall hand motion)    10/29/21: full finger motion & opposition today   A/ROM Right 05/01/2021 Rt 09/17/21  Long MCP (0-90) 0-41  0- 74  Long PIP (0-100) 0-69  (-7) - 92  Long DIP (0-70) 0-35 0- 68  (Blank rows = not tested)   UE MMT:  09/24/21: he demo's at least 4-/5 MMT at wrist and elbow now but in a very reduced ROM.    MMT Right 10/02/21 Right 10/29/21  Elbow flexion     Elbow extension     Wrist flexion 4+/5   5/5 no tenderness  Wrist extension 4+/5  5/5  Wrist ulnar deviation     Wrist radial deviation     Wrist pronation     Wrist supination     (Blank rows = not tested)   HAND FUNCTION: 10/29/21: Right Grip: 53#   TODAY'S TREATMENT:   10/29/21: Pt performs AROM, gripping, and strength with right hand/wrist against resistance for exercise/activities as well as new measures today. Using that data, OT also reviews home exercises and dynamic orthotic wear, as the pt wishes to perform. OT also discusses home and functional tasks with the pt and  reviews goals. Pt states understanding and tolerates PRE with full arm strength today- pushing 25#, pulling 45#, curling 25# with biceps and pushing 25# with triceps all repetitively, with now pain.       PATIENT EDUCATION: Education details: HEP, splinting use, care and precautions. Person educated: Patient Education method: Consulting civil engineer, Demonstration, Verbal cues, and Handouts Education comprehension: verbalized understanding, returned demonstration, and needs further education     HOME EXERCISE PROGRAM: See treatment section above [id number]   ASSESSMENT:   CLINICAL IMPRESSION: 10/29/21: He seems to have maximized his progress in terms of motion, at this point, and falls just slightly below normal outcome ranges for this type of surgery (researched norms per 2021 study (minimum ranges): 55# grip, 40* wrist ext, 36* wrist flexion). However, he is pleased with his current functional ability, states no significant IADL issues and also knows how to keep strengthening and stretching to maximize his ability. His sensation has also improved to WNL at this point. He will d/c therapy now.    GOALS: Goals reviewed with patient? No   SHORT TERM GOALS: (STG required if POC>30 days)   STG Name Target Date Goal status  1 Pt will be Mod I splinting use, care and precautions R wrist/forearm Baseline:  05/13/2021 MET  2 Pt will be Mod I initial HEP R UE as seen  by ability to perform in clinic Baseline:  05/13/2021 MET and Upgrading HEP  3 Pt will be Mod I edema control techniques as seen by ability to state 1-2 techniques w/o vc's Baseline:  05/13/2021 MET    LONG TERM GOALS:    LTG Name Target Date Goal status  1 Pt will be Mod I updated HEP for R UE Baseline:  10/16/2021 10/29/21: MET   2 Pt will be Mod I scar management and desensitization R forearm/wrist as observed in clinic Baseline:  08/24/2021 08/24/21- Met and understands.  3 Pt will increase TAM of right MF from 204* to at least 230* for tight, full fist ability.  10/16/2021 10/29/21: MET 230* + today    4 Pt will demonstrate improved grip strength R UE as seen by ability to grip 70# or greater via JAMAR dynamometer Baseline: 100# or more in right hand (comparing to left hand baseline)  10/16/21 10/29/21: Not met- 53# today    5 Pt will increase right wrist flex/ext ROM from 20*/25* to at least 45/50* respectively for better reaching and holding of home and work objects.   10/16/21  10/29/21: Not met, maxed out in 30's*     PLAN: OT FREQUENCY: 1 additional visit (to cover today's visit)    OT DURATION: 2 additional weeks (10/16/21 to 10/29/21 to cover today's visit)   PLANNED INTERVENTIONS: self care/ADL training, therapeutic exercise, therapeutic activity, scar mobilization, passive range of motion, splinting, ultrasound, fluidotherapy, patient/family education, and DME and/or AE instructions   RECOMMENDED OTHER SERVICES: N/A   CONSULTED AND AGREED WITH PLAN OF CARE: Patient  PLAN FOR NEXT SESSION:  D/C today    Benito Mccreedy, OTR/L, CHT 10/29/2021, 9:42 AM   OCCUPATIONAL THERAPY DISCHARGE SUMMARY  Visits from Start of Care: 12  Current functional level related to goals / functional outcomes: Pt has met all basic goals but was unable to meet upgraded goals post last progress note.   Remaining deficits: Pt has no more significant functional deficits or pain. Able to do higher  level IADL tasks like hammering, painting, etc. and has no work related issues.   Education / Equipment:  Pt has all needed materials and education. Pt understands how to continue on with self-management. See tx notes for more details.   Patient agrees to discharge due to max benefits received from outpatient occupational therapy / hand therapy at this time.   Benito Mccreedy, OTR/L, CHT 10/29/21

## 2021-11-05 ENCOUNTER — Ambulatory Visit (INDEPENDENT_AMBULATORY_CARE_PROVIDER_SITE_OTHER): Payer: 59

## 2021-11-05 ENCOUNTER — Ambulatory Visit (INDEPENDENT_AMBULATORY_CARE_PROVIDER_SITE_OTHER): Payer: 59 | Admitting: Orthopedic Surgery

## 2021-11-05 DIAGNOSIS — S52571A Other intraarticular fracture of lower end of right radius, initial encounter for closed fracture: Secondary | ICD-10-CM

## 2021-11-05 NOTE — Progress Notes (Signed)
Post-Op Visit Note   Patient: Stephen [name].           Date of Birth: 01-Jan-1961           MRN: [id number] Visit Date: 11/05/2021 PCP: [name], MD   Assessment & Plan:  Chief Complaint:  Chief Complaint  Patient presents with   Right Wrist - Follow-up, Routine Post Op   Visit Diagnoses:  1. Other closed intra-articular fracture of distal end of right radius, initial encounter     Plan: Patient is just under 12 weeks s/p removal of right distal radius bridge plate.  He is doing very well.  He has symmetric grip strength to the contralateral hand.  He has full pronation/supination.  He has some limitations with flexion and extension but this doesn't seem to be functionally limiting.  He was able to pain and do some work around the house without any difficulty.  He has been discharged from therapy.  He can see me back again as needed.   Follow-Up Instructions: No follow-ups on file.   Orders:  Orders Placed This Encounter  Procedures   XR Wrist Complete Right   No orders of the defined types were placed in this encounter.   Imaging: No results found.  PMFS History: Patient Active Problem List   Diagnosis Date Noted   Median nerve neuropathy, right 09/28/2021   Retained orthopedic hardware    Closed fracture of right distal radius 04/07/2021   Heme positive stool 08/06/2020   Hordeolum externum of right upper eyelid 08/06/2020   Right hand pain 11/15/2017   Dermatitis due to drug reaction 09/02/2017   Drug-induced erythroderma 09/02/2017   Laryngitis, acute 08/04/2017   Nonallopathic lesion of cervical region 04/06/2017   Nonallopathic lesion of thoracic region 04/06/2017   Nonallopathic lesion of lumbosacral region 04/06/2017   Cervical radiculopathy at C8 03/04/2017   Excessive cerumen in both ear canals 02/15/2017   Neuropathy, cervical plexus 02/15/2017   Poison ivy dermatitis 07/29/2015   Pharyngitis 09/26/2013   Back strain 06/11/2013    Healthcare maintenance 04/25/2011   Mixed hyperlipidemia 03/14/2010   Essential hypertension 03/14/2010   Past Medical History:  Diagnosis Date   Anxiety    Colon polyps    Detached retina, right 03/22/1990   Dr. [name] at [facility] did orbital band procedure with good results.    High cholesterol    Hypertension    Poison ivy dermatitis 07/29/2015    Family History  Problem Relation Age of Onset   Parkinsonism Mother    Colon polyps Father    Colon cancer Father 24   Cancer Father        colon cancer with mets   Hyperlipidemia Father    Hypertension Father    Hypertension Brother    Diabetes Neg Hx    Heart disease Neg Hx    Esophageal cancer Neg Hx    Rectal cancer Neg Hx    Stomach cancer Neg Hx     Past Surgical History:  Procedure Laterality Date   COLONOSCOPY  04/2007   Dr.Magod   COLONOSCOPY  09/06/2014   Dr.Magod   HARDWARE REMOVAL Right 08/12/2021   Procedure: RIGHT WRIST REMOVAL OF HARDWARE, EXTENSOR TENODESIS;  Surgeon: [name], MD;  Location: [place];  Service: Orthopedics;  Laterality: Right;   OPEN REDUCTION INTERNAL FIXATION (ORIF) DISTAL RADIAL FRACTURE Right 04/08/2021   Procedure: Right OPEN REDUCTION INTERNAL FIXATION (ORIF) DISTAL RADIAL FRACTURE;  Surgeon: [name], MD;  Location: [place];  Service: Orthopedics;  Laterality: Right;   RETINAL DETACHMENT SURGERY     30 years ago   Social History   Occupational History   Occupation: busines    Employer: [employer]  Tobacco Use   Smoking status: Never   Smokeless tobacco: Never  Vaping Use   Vaping Use: Never used  Substance and Sexual Activity   Alcohol use: Yes    Comment: occ beer   Drug use: No   Sexual activity: Yes    Partners: Female

## 2021-11-06 ENCOUNTER — Other Ambulatory Visit: Payer: Self-pay | Admitting: Family Medicine

## 2021-11-08 MED ORDER — VENLAFAXINE HCL ER 37.5 MG PO CP24
ORAL_CAPSULE | ORAL | 1 refills | Status: DC
  Filled 2021-11-08: qty 180, 90d supply, fill #0
  Filled 2022-02-03: qty 92, 46d supply, fill #1
  Filled 2022-02-04: qty 88, 44d supply, fill #1

## 2021-11-09 ENCOUNTER — Other Ambulatory Visit (HOSPITAL_BASED_OUTPATIENT_CLINIC_OR_DEPARTMENT_OTHER): Payer: Self-pay

## 2021-11-10 ENCOUNTER — Other Ambulatory Visit (HOSPITAL_BASED_OUTPATIENT_CLINIC_OR_DEPARTMENT_OTHER): Payer: Self-pay

## 2021-11-11 NOTE — Progress Notes (Signed)
Stephen [name] Phone: [phone] Subjective:   Fontaine No, am serving as a scribe for Dr. Hulan Saas.  I'm seeing this patient by the request  of:  Stephen Maw, MD  CC: Right shoulder and neck pain follow-up  WEX:[id number]  OMT in December 2022  Stephen [name]. is a 61 y.o. male coming in with complaint of right shoulder pain.  Since we have seen patient patient did unfortunately have a fracture of the distal end of the right radius.  Had removal of hardware and extensor tendinosis and has been in occupational therapy.  Has not been seen in the office since December. Patient feels like his shoulder is aggrevated from working.        Past Medical History:  Diagnosis Date   Anxiety    Colon polyps    Detached retina, right 03/22/1990   Dr. [name] at [facility] did orbital band procedure with good results.    High cholesterol    Hypertension    Poison ivy dermatitis 07/29/2015   Past Surgical History:  Procedure Laterality Date   COLONOSCOPY  04/2007   Dr.Magod   COLONOSCOPY  09/06/2014   Dr.Magod   HARDWARE REMOVAL Right 08/12/2021   Procedure: RIGHT WRIST REMOVAL OF HARDWARE, EXTENSOR TENODESIS;  Surgeon: [name], MD;  Location: [place];  Service: Orthopedics;  Laterality: Right;   OPEN REDUCTION INTERNAL FIXATION (ORIF) DISTAL RADIAL FRACTURE Right 04/08/2021   Procedure: Right OPEN REDUCTION INTERNAL FIXATION (ORIF) DISTAL RADIAL FRACTURE;  Surgeon: [name], MD;  Location: [place];  Service: Orthopedics;  Laterality: Right;   RETINAL DETACHMENT SURGERY     30 years ago   Social History   Socioeconomic History   Marital status: Married    Spouse name: Not on file   Number of children: 2   Years of education: 16   Highest education level: Not on file  Occupational History   Occupation: busines    Employer: [employer]  Tobacco Use   Smoking  status: Never   Smokeless tobacco: Never  Vaping Use   Vaping Use: Never used  Substance and Sexual Activity   Alcohol use: Yes    Comment: occ beer   Drug use: No   Sexual activity: Yes    Partners: Female  Other Topics Concern   Not on file  Social History Narrative   HSG, [employer] - BS business admin. Married '84 - 1 son '86, 1 dtr -'51. 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 ACP discussed and he is referred to [email or web address].   Social Determinants of Health   Financial Resource Strain: Not on file  Food Insecurity: Not on file  Transportation Needs: Not on file  Physical Activity: Not on file  Stress: Not on file  Social Connections: Not on file   No Known Allergies Family History  Problem Relation Age of Onset   Parkinsonism Mother    Colon polyps Father    Colon cancer Father 21   Cancer Father        colon cancer with mets   Hyperlipidemia Father    Hypertension Father    Hypertension Brother    Diabetes Neg Hx    Heart disease Neg Hx    Esophageal cancer Neg Hx    Rectal cancer Neg Hx    Stomach cancer Neg Hx      Current  Outpatient Medications (Cardiovascular):    lisinopril (ZESTRIL) 20 MG tablet, TAKE 1 TABLET BY MOUTH ONCE DAILY   pravastatin (PRAVACHOL) 40 MG tablet, TAKE 1 TABLET BY MOUTH EVERY NIGHT AT BEDTIME     Current Outpatient Medications (Other):    Multiple Vitamin (MULTIVITAMIN) tablet, Take 1 tablet by mouth daily.   Omega-3 Fatty Acids (SALMON OIL-1000 PO), Take 1,000 mg by mouth daily.   venlafaxine XR (EFFEXOR-XR) 37.5 MG 24 hr capsule, TAKE 2 CAPSULES BY MOUTH ONCE DAILY WITH BREAKFAST   Reviewed prior external information including notes and imaging from  primary care provider As well as notes that were available from care everywhere and other healthcare systems.  Past medical history, social, surgical and family history all reviewed in  electronic medical record.  No pertanent information unless stated regarding to the chief complaint.   Review of Systems:  No headache, visual changes, nausea, vomiting, diarrhea, constipation, dizziness, abdominal pain, skin rash, fevers, chills, night sweats, weight loss, swollen lymph nodes, body aches, joint swelling, chest pain, shortness of breath, mood changes. POSITIVE muscle aches  Objective  There were no vitals taken for this visit.   General: No apparent distress alert and oriented x3 mood and affect normal, dressed appropriately.  HEENT: Pupils equal, extraocular movements intact  Respiratory: Patient's speak in full sentences and does not appear short of breath  Cardiovascular: No lower extremity edema, non tender, no erythema  Shoulder exam shows patient has limited range of motion noted with internal rotation.  Patient does have mild positive impingement but 5 out of 5 strength of the rotator cuff.  Neck exam does have some limited sidebending bilaterally.  Patient's right wrist has had surgical intervention and only has 3 degrees of extension of the wrist.  Good grip strength are noted.  Osteopathic findings C2 flexed rotated and side bent right C4 flexed rotated and side bent left C7 flexed rotated and side bent right T3 extended rotated and side bent right inhaled third rib T9 extended rotated and side bent left L2 flexed rotated and side bent right L5 flexed rotated and side bent right Sacrum right on right     Impression and Recommendations:    The above documentation has been reviewed and is accurate and complete Lyndal Pulley, DO

## 2021-11-12 ENCOUNTER — Ambulatory Visit: Payer: Self-pay

## 2021-11-12 ENCOUNTER — Ambulatory Visit (INDEPENDENT_AMBULATORY_CARE_PROVIDER_SITE_OTHER): Payer: 59 | Admitting: Family Medicine

## 2021-11-12 VITALS — BP 132/86 | HR 74 | Ht 75.0 in | Wt 212.0 lb

## 2021-11-12 DIAGNOSIS — M9903 Segmental and somatic dysfunction of lumbar region: Secondary | ICD-10-CM | POA: Diagnosis not present

## 2021-11-12 DIAGNOSIS — M9901 Segmental and somatic dysfunction of cervical region: Secondary | ICD-10-CM

## 2021-11-12 DIAGNOSIS — M999 Biomechanical lesion, unspecified: Secondary | ICD-10-CM | POA: Diagnosis not present

## 2021-11-12 DIAGNOSIS — M9908 Segmental and somatic dysfunction of rib cage: Secondary | ICD-10-CM

## 2021-11-12 DIAGNOSIS — M5412 Radiculopathy, cervical region: Secondary | ICD-10-CM

## 2021-11-12 DIAGNOSIS — M9902 Segmental and somatic dysfunction of thoracic region: Secondary | ICD-10-CM | POA: Diagnosis not present

## 2021-11-12 DIAGNOSIS — M25511 Pain in right shoulder: Secondary | ICD-10-CM | POA: Diagnosis not present

## 2021-11-12 DIAGNOSIS — M9904 Segmental and somatic dysfunction of sacral region: Secondary | ICD-10-CM

## 2021-11-12 NOTE — Patient Instructions (Signed)
Exercises See me again in 5-6 weeks Vertical mouse Can increase activity

## 2021-11-13 NOTE — Assessment & Plan Note (Signed)
Patient has had some increasing in tightness of the neck noted.  I do believe that with patient's recent surgery on the wrist and some mild muscle imbalances this is contributing to more impingement on the shoulder.  Discussed with patient about home exercises, icing regimen.  Watch for any increase in weakness or radicular symptoms that patient has had previously. Follow-up with me again in 6 to 8 weeks.

## 2021-11-13 NOTE — Assessment & Plan Note (Signed)

## 2021-11-19 ENCOUNTER — Encounter: Payer: Self-pay | Admitting: Nurse Practitioner

## 2021-11-19 ENCOUNTER — Telehealth: Payer: 59 | Admitting: Family Medicine

## 2021-11-19 ENCOUNTER — Other Ambulatory Visit (HOSPITAL_BASED_OUTPATIENT_CLINIC_OR_DEPARTMENT_OTHER): Payer: Self-pay

## 2021-11-19 ENCOUNTER — Telehealth (INDEPENDENT_AMBULATORY_CARE_PROVIDER_SITE_OTHER): Payer: 59 | Admitting: Nurse Practitioner

## 2021-11-19 VITALS — Temp 99.4°F

## 2021-11-19 DIAGNOSIS — U071 COVID-19: Secondary | ICD-10-CM | POA: Insufficient documentation

## 2021-11-19 MED ORDER — BENZONATATE 100 MG PO CAPS
100.0000 mg | ORAL_CAPSULE | Freq: Two times a day (BID) | ORAL | 0 refills | Status: DC | PRN
  Filled 2021-11-19: qty 30, 15d supply, fill #0

## 2021-11-19 MED ORDER — NIRMATRELVIR/RITONAVIR (PAXLOVID)TABLET
3.0000 | ORAL_TABLET | Freq: Two times a day (BID) | ORAL | 0 refills | Status: AC
  Filled 2021-11-19: qty 30, 5d supply, fill #0

## 2021-11-19 NOTE — Progress Notes (Signed)
[facility] [address] [place] [address] Dept: [phone] Dept Fax: [phone]  Virtual Video Visit  I connected with Stephen [name]. on 11/19/21 at 11:00 AM EDT by a video enabled telemedicine application and verified that I am speaking with the correct person using two identifiers.  Location patient: Home Location provider: Clinic Persons participating in the virtual visit: Patient; Vance Peper, NP; Marchia Bond, CMA  I discussed the limitations of evaluation and management by telemedicine and the availability of in person appointments. The patient expressed understanding and agreed to proceed.  Chief Complaint  Patient presents with   Covid Positive    Pt tested pos for covid 11/18/21 w/ sore throat/nasal congestion/ body chills/ headache/ fatigue/ and mild cough x2 days    SUBJECTIVE:  HPI: Stephen [name]. is a 61 y.o. male who presents with sore throat, nasal congestion, and cough since yesterday. He had a positive covid-19 test yesterday.   UPPER RESPIRATORY TRACT INFECTION  Fever:  chills Cough: yes Shortness of breath: no Wheezing: no Chest pain: no Chest tightness: no Chest congestion: no Nasal congestion: yes Runny nose: yes Post nasal drip: no Sneezing: yes Sore throat: yes Swollen glands: no Sinus pressure: no Headache: yes Face pain: no Toothache: no Ear pain: no bilateral Ear pressure: no bilateral Eyes red/itching:no Eye drainage/crusting: no  Vomiting: no Rash: no Fatigue: yes Sick contacts: yes Strep contacts: no  Context: worse Recurrent sinusitis: no Relief with OTC cold/cough medications: no  Treatments attempted: lozenges, tylenol   Patient Active Problem List   Diagnosis Date Noted   COVID-19 11/19/2021   Median nerve neuropathy, right 09/28/2021   Retained orthopedic hardware    Closed fracture of right distal radius 04/07/2021   Heme positive stool  08/06/2020   Hordeolum externum of right upper eyelid 08/06/2020   Right hand pain 11/15/2017   Dermatitis due to drug reaction 09/02/2017   Drug-induced erythroderma 09/02/2017   Laryngitis, acute 08/04/2017   Nonallopathic lesion of cervical region 04/06/2017   Nonallopathic lesion of thoracic region 04/06/2017   Nonallopathic lesion of lumbosacral region 04/06/2017   Cervical radiculopathy at C8 03/04/2017   Excessive cerumen in both ear canals 02/15/2017   Neuropathy, cervical plexus 02/15/2017   Poison ivy dermatitis 07/29/2015   Pharyngitis 09/26/2013   Back strain 06/11/2013   Healthcare maintenance 04/25/2011   Mixed hyperlipidemia 03/14/2010   Essential hypertension 03/14/2010    Past Surgical History:  Procedure Laterality Date   COLONOSCOPY  04/2007   Dr.Magod   COLONOSCOPY  09/06/2014   Dr.Magod   HARDWARE REMOVAL Right 08/12/2021   Procedure: RIGHT WRIST REMOVAL OF HARDWARE, EXTENSOR TENODESIS;  Surgeon: [name], MD;  Location: [place];  Service: Orthopedics;  Laterality: Right;   OPEN REDUCTION INTERNAL FIXATION (ORIF) DISTAL RADIAL FRACTURE Right 04/08/2021   Procedure: Right OPEN REDUCTION INTERNAL FIXATION (ORIF) DISTAL RADIAL FRACTURE;  Surgeon: [name], MD;  Location: [place];  Service: Orthopedics;  Laterality: Right;   RETINAL DETACHMENT SURGERY     30 years ago    Family History  Problem Relation Age of Onset   Parkinsonism Mother    Colon polyps Father    Colon cancer Father 3   Cancer Father        colon cancer with mets   Hyperlipidemia Father    Hypertension Father    Hypertension Brother    Diabetes Neg Hx    Heart disease Neg Hx  Esophageal cancer Neg Hx    Rectal cancer Neg Hx    Stomach cancer Neg Hx     Social History   Tobacco Use   Smoking status: Never   Smokeless tobacco: Never  Vaping Use   Vaping Use: Never used  Substance Use Topics   Alcohol use: Yes    Comment: occ beer   Drug  use: No     Current Outpatient Medications:    benzonatate (TESSALON) 100 MG capsule, Take 1 capsule (100 mg total) by mouth 2 (two) times daily as needed for cough., Disp: 30 capsule, Rfl: 0   nirmatrelvir/ritonavir EUA (PAXLOVID) 20 x 150 MG & 10 x '100MG'$  TABS, Take 3 tablets by mouth 2 (two) times daily for 5 days. (Take nirmatrelvir 150 mg two tablets twice daily for 5 days and ritonavir 100 mg one tablet twice daily for 5 days) Patient GFR is >60, Disp: 30 tablet, Rfl: 0   lisinopril (ZESTRIL) 20 MG tablet, TAKE 1 TABLET BY MOUTH ONCE DAILY, Disp: 90 tablet, Rfl: 4   Multiple Vitamin (MULTIVITAMIN) tablet, Take 1 tablet by mouth daily., Disp: , Rfl:    Omega-3 Fatty Acids (SALMON OIL-1000 PO), Take 1,000 mg by mouth daily., Disp: , Rfl:    pravastatin (PRAVACHOL) 40 MG tablet, TAKE 1 TABLET BY MOUTH EVERY NIGHT AT BEDTIME, Disp: 90 tablet, Rfl: 4   venlafaxine XR (EFFEXOR-XR) 37.5 MG 24 hr capsule, TAKE 2 CAPSULES BY MOUTH ONCE DAILY WITH BREAKFAST, Disp: 180 capsule, Rfl: 1  No Known Allergies  ROS: See pertinent positives and negatives per HPI.  OBSERVATIONS/OBJECTIVE:  VITALS per patient if applicable: Today's Vitals   11/19/21 1055  Temp: 99.4 F (37.4 C)  TempSrc: Oral   There is no height or weight on file to calculate BMI.    GENERAL: Alert and oriented. Appears well and in no acute distress.  HEENT: Atraumatic. Conjunctiva clear. No obvious abnormalities on inspection of external nose and ears.  NECK: Normal movements of the head and neck.  LUNGS: On inspection, no signs of respiratory distress. Breathing rate appears normal. No obvious gross SOB, gasping or wheezing, and no conversational dyspnea.  CV: No obvious cyanosis.  MS: Moves all visible extremities without noticeable abnormality.  PSYCH/NEURO: Pleasant and cooperative. No obvious depression or anxiety. Speech and thought processing grossly intact.  ASSESSMENT AND PLAN:  Problem List Items Addressed  This Visit       Other   COVID-19 - Primary    Symptoms started and he tested positive yesterday.  We will start Paxlovid twice a day for 5 days.  Stop his pravastatin and omega-3 while taking the Paxlovid.  Encourage rest, fluids.  Tessalon as needed cough.  Work note given.  Reviewed home care instructions for COVID. Advised self-isolation at home for at least 5 days. After 5 days, if improved and fever resolved, can be in public, but should wear a mask around others for an additional 5 days. If symptoms, esp, dyspnea develops/worsens, recommend in-person evaluation at either an urgent care or the emergency room.       Relevant Medications   nirmatrelvir/ritonavir EUA (PAXLOVID) 20 x 150 MG & 10 x '100MG'$  TABS     I discussed the assessment and treatment plan with the patient. The patient was provided an opportunity to ask questions and all were answered. The patient agreed with the plan and demonstrated an understanding of the instructions.   The patient was advised to call back or seek an in-person  evaluation if the symptoms worsen or if the condition fails to improve as anticipated.   Charyl Dancer, NP

## 2021-11-19 NOTE — Assessment & Plan Note (Signed)
Symptoms started and he tested positive yesterday.  We will start Paxlovid twice a day for 5 days.  Stop his pravastatin and omega-3 while taking the Paxlovid.  Encourage rest, fluids.  Tessalon as needed cough.  Work note given.  Reviewed home care instructions for COVID. Advised self-isolation at home for at least 5 days. After 5 days, if improved and fever resolved, can be in public, but should wear a mask around others for an additional 5 days. If symptoms, esp, dyspnea develops/worsens, recommend in-person evaluation at either an urgent care or the emergency room.

## 2021-11-19 NOTE — Patient Instructions (Signed)
It was great to see you!  Start Paxlovid 3 capsules twice a day to treat COVID virus.  I have also sent in some cough medicine that he can take as needed.  Make sure you are drinking plenty of fluids and getting rest.  You need to stay out of work for 5 days from when your symptoms started, I have sent a work note through my chart.  If you do go out in public, make sure you wear a mask for 10 days after your symptoms started.  Stop your omega-3 and pravastatin while taking the Paxlovid.  Let's follow-up if your symptoms worsen or do not improve  Take care,  Vance Peper, NP

## 2021-12-16 ENCOUNTER — Encounter: Payer: Self-pay | Admitting: Family Medicine

## 2021-12-16 ENCOUNTER — Ambulatory Visit: Payer: 59 | Admitting: Family Medicine

## 2021-12-16 VITALS — BP 138/80 | HR 77 | Temp 97.8°F | Ht 75.0 in | Wt 208.6 lb

## 2021-12-16 DIAGNOSIS — Z13820 Encounter for screening for osteoporosis: Secondary | ICD-10-CM | POA: Diagnosis not present

## 2021-12-16 DIAGNOSIS — B349 Viral infection, unspecified: Secondary | ICD-10-CM | POA: Diagnosis not present

## 2021-12-16 NOTE — Progress Notes (Signed)
Established Patient Office Visit  Subjective   Patient ID: Stephen Reisz., male    DOB: Oct 27, 1960  Age: 61 y.o. MRN: [id number]  Chief Complaint  Patient presents with   Sore Throat    Sore throat, nasal congestion and cough x 2 days.     Sore Throat  Associated symptoms include congestion and coughing. Pertinent negatives include no abdominal pain, headaches or shortness of breath.   presents with a 2-day history of nasal congestion, mild sore throat, mild cough and some malaise.  Denies fevers chills headaches body aches, difficulty breathing wheezing nausea or vomiting.  His mother recently passed.  Status post [place] injury 4 months ago.  Healing well.  Continues to work on range of motion through the wrist.    Review of Systems  Constitutional:  Positive for malaise/fatigue. Negative for chills and fever.  HENT:  Positive for congestion and sore throat.   Eyes:  Negative for blurred vision, discharge and redness.  Respiratory:  Positive for cough. Negative for sputum production, shortness of breath and wheezing.   Cardiovascular: Negative.   Gastrointestinal:  Negative for abdominal pain.  Genitourinary: Negative.   Musculoskeletal: Negative.  Negative for joint pain and myalgias.  Skin:  Negative for rash.  Neurological:  Negative for tingling, loss of consciousness, weakness and headaches.  Endo/Heme/Allergies:  Negative for polydipsia.      Objective:     BP 138/80 (BP Location: Right Arm, Patient Position: Sitting, Cuff Size: Normal)   Pulse 77   Temp 97.8 F (36.6 C) (Temporal)   Ht '6\' 3"'$  (1.905 m)   Wt 208 lb 9.6 oz (94.6 kg)   SpO2 98%   BMI 26.07 kg/m    Physical Exam Constitutional:      General: He is not in acute distress.    Appearance: Normal appearance. He is not ill-appearing, toxic-appearing or diaphoretic.  HENT:     Head: Normocephalic and atraumatic.     Right Ear: Tympanic membrane, ear canal and external ear normal.     Left Ear:  Tympanic membrane, ear canal and external ear normal.     Mouth/Throat:     Mouth: Mucous membranes are moist.     Pharynx: Oropharynx is clear. Posterior oropharyngeal erythema present. No oropharyngeal exudate.     Tonsils: No tonsillar exudate or tonsillar abscesses.  Eyes:     General: No scleral icterus.       Right eye: No discharge.        Left eye: No discharge.     Extraocular Movements: Extraocular movements intact.     Conjunctiva/sclera: Conjunctivae normal.     Pupils: Pupils are equal, round, and reactive to light.  Cardiovascular:     Rate and Rhythm: Normal rate and regular rhythm.  Pulmonary:     Effort: Pulmonary effort is normal. No respiratory distress.     Breath sounds: Normal breath sounds. No wheezing or rales.  Abdominal:     General: Bowel sounds are normal.     Tenderness: There is no abdominal tenderness. There is no guarding.  Musculoskeletal:     Cervical back: Normal range of motion and neck supple. No rigidity or tenderness.  Lymphadenopathy:     Cervical: No cervical adenopathy.  Skin:    General: Skin is warm and dry.  Neurological:     Mental Status: He is alert and oriented to person, place, and time.  Psychiatric:        Mood and Affect: Mood normal.  Behavior: Behavior normal.      No results found for any visits on 12/16/21.    The 10-year ASCVD risk score (Arnett DK, et al., 2019) is: 10.4%    Assessment & Plan:   Problem List Items Addressed This Visit   None Visit Diagnoses     Viral syndrome    -  Primary   Screening for osteoporosis       Relevant Orders   DG Bone Density       No follow-ups on file.   We will use cough drops and Mucinex DM as needed.  Follow-up if not improving by next week.  Screening for osteoporosis after [place] injury. [name], MD

## 2021-12-17 NOTE — Progress Notes (Signed)
[place] Phone: [phone] Subjective:   Stephen [name], am serving as a scribe for Dr. Hulan Saas.  I'm seeing this patient by the request  of:  [name], MD  CC: back and neck pain f/u   [id number]  Stephen [name]. is a 61 y.o. male coming in with complaint of back and neck pain. OMT 11/12/2021. Also f/u for R shoulder pain. Patient states that he has been doing well since last visit.   Medications patient has been prescribed: Effexor  Taking: yes          Reviewed prior external information including notes and imaging from previsou exam, outside providers and external EMR if available.   As well as notes that were available from care everywhere and other healthcare systems.  Past medical history, social, surgical and family history all reviewed in electronic medical record.  [name] pertanent information unless stated regarding to the chief complaint.   Past Medical History:  Diagnosis Date   Anxiety    Colon polyps    Detached retina, right 03/22/1990   Dr. [name] at [facility] did orbital band procedure with good results.    High cholesterol    Hypertension    Poison ivy dermatitis 07/29/2015    [name] Known Allergies   Review of Systems:  [name] headache, visual changes, nausea, vomiting, diarrhea, constipation, dizziness, abdominal pain, skin rash, fevers, chills, night sweats, weight loss, swollen lymph nodes, body aches, joint swelling, chest pain, shortness of breath, mood changes. POSITIVE muscle aches  Objective  Blood pressure 136/82, pulse 64, height '6\' 3"'$  (1.905 m), weight 211 lb (95.7 kg), SpO2 99 %.   General: [name] apparent distress alert and oriented x3 mood and affect normal, dressed appropriately.  HEENT: Pupils equal, extraocular movements intact  Respiratory: Patient's speak in full sentences and does not appear short of breath  Cardiovascular: [name] lower extremity  edema, non tender, [name] erythema  Neck exam seems tighter than usual.  Seems to be left greater than right.  Negative for any Spurling's aspect.  Does have some limited range of motion more than usual.  Osteopathic findings  C3 flexed rotated and side bent right C6 flexed rotated and side bent left T3 extended rotated and side bent right inhaled rib T6 extended rotated and side bent left L2 flexed rotated and side bent right Sacrum right on right     Assessment and Plan:  Cervical radiculopathy at C8 Increasing tightness noted today.  Patient though recently did lose his mother.  Likely some more stress recently.  Has not been sleeping in his own bed the entire time.  Discussed with patient about icing regimen and home exercises.  Worsening discomfort or pain potentially consider increasing the Effexor to 75 g.  Follow-up with me again in 4 to 6 weeks otherwise.    Nonallopathic problems  Decision today to treat with OMT was based on Physical Exam  After verbal consent patient was treated with HVLA, ME, FPR techniques in cervical, rib, thoracic, lumbar, and sacral  areas  Patient tolerated the procedure well with improvement in symptoms  Patient given exercises, stretches and lifestyle modifications  See medications in patient instructions if given  Patient will follow up in 4-8 weeks     The above documentation has been reviewed and is accurate and complete Stephen Pulley, DO         Note: This dictation was prepared with Viviann Spare  dictation along with smaller phrase technology. Any transcriptional errors that result from this process are unintentional.

## 2021-12-22 ENCOUNTER — Ambulatory Visit (INDEPENDENT_AMBULATORY_CARE_PROVIDER_SITE_OTHER): Payer: 59 | Admitting: Family Medicine

## 2021-12-22 VITALS — BP 136/82 | HR 64 | Ht 75.0 in | Wt 211.0 lb

## 2021-12-22 DIAGNOSIS — M9908 Segmental and somatic dysfunction of rib cage: Secondary | ICD-10-CM | POA: Diagnosis not present

## 2021-12-22 DIAGNOSIS — M5412 Radiculopathy, cervical region: Secondary | ICD-10-CM | POA: Diagnosis not present

## 2021-12-22 DIAGNOSIS — M9902 Segmental and somatic dysfunction of thoracic region: Secondary | ICD-10-CM

## 2021-12-22 DIAGNOSIS — M9903 Segmental and somatic dysfunction of lumbar region: Secondary | ICD-10-CM | POA: Diagnosis not present

## 2021-12-22 DIAGNOSIS — M9904 Segmental and somatic dysfunction of sacral region: Secondary | ICD-10-CM | POA: Diagnosis not present

## 2021-12-22 DIAGNOSIS — M9901 Segmental and somatic dysfunction of cervical region: Secondary | ICD-10-CM

## 2021-12-22 NOTE — Patient Instructions (Signed)
Sorry for your loss We are here if you need [place] See me in 5-6 weeks

## 2021-12-22 NOTE — Assessment & Plan Note (Signed)
Increasing tightness noted today.  Patient though recently did lose his mother.  Likely some more stress recently.  Has not been sleeping in his own bed the entire time.  Discussed with patient about icing regimen and home exercises.  Worsening discomfort or pain potentially consider increasing the Effexor to 75 g.  Follow-up with me again in 4 to 6 weeks otherwise.

## 2022-01-13 ENCOUNTER — Encounter: Payer: Self-pay | Admitting: Family Medicine

## 2022-01-14 ENCOUNTER — Ambulatory Visit (HOSPITAL_BASED_OUTPATIENT_CLINIC_OR_DEPARTMENT_OTHER)
Admission: RE | Admit: 2022-01-14 | Discharge: 2022-01-14 | Disposition: A | Discharge status: Home / Self Care | Payer: 59 | Source: Ambulatory Visit | Attending: Family Medicine | Admitting: Family Medicine

## 2022-01-14 DIAGNOSIS — Z13820 Encounter for screening for osteoporosis: Secondary | ICD-10-CM | POA: Diagnosis not present

## 2022-01-20 NOTE — Progress Notes (Signed)
[place] Phone: [phone] Subjective:   Stephen [name], am serving as a scribe for Dr. Hulan [name].  I'm seeing this patient by the request  of:  Stephen Maw, MD  CC: Back and neck pain follow-up  [id number]  Stephen [name]. is a 61 y.o. male coming in with complaint of back and neck pain. OMT 12/22/2021. Patient states that he has been doing well since last visit.   Medications patient has been prescribed: Effexor  Taking:         Reviewed prior external information including notes and imaging from previsou exam, outside providers and external EMR if available.   As well as notes that were available from care everywhere and other healthcare systems.  Past medical history, social, surgical and family history all reviewed in electronic medical record.  [name] pertanent information unless stated regarding to the chief complaint.   Past Medical History:  Diagnosis Date   Anxiety    Colon polyps    Detached retina, right 03/22/1990   Dr. Tana [name] at [facility] did orbital band procedure with good results.    High cholesterol    Hypertension    Poison ivy dermatitis 07/29/2015    [name] Known Allergies   Review of Systems:  [name] headache, visual changes, nausea, vomiting, diarrhea, constipation, dizziness, abdominal pain, skin rash, fevers, chills, night sweats, weight loss, swollen lymph nodes, body aches, joint swelling, chest pain, shortness of breath, mood changes. POSITIVE muscle aches  Objective  Blood pressure (!) 148/82, pulse 75, height '6\' 3"'$  (1.905 m), weight 215 lb (97.5 kg), SpO2 98 %.   General: [name] apparent distress alert and oriented x3 mood and affect normal, dressed appropriately.  HEENT: Pupils equal, extraocular movements intact  Respiratory: Patient's speak in full sentences and does not appear short of breath  Cardiovascular: [name] lower extremity edema, non tender, [name] erythema   MSK:  Back does have some loss of lordosis.  Patient does have more neck pain still in the right greater than left.  Negative Spurling's but does have limited sidebending and extension.  Osteopathic findings  C2 flexed rotated and side bent right C6 flexed rotated and side bent right T3 extended rotated and side bent right inhaled rib L2 flexed rotated and side bent right Sacrum right on right       Assessment and Plan:  Cervical radiculopathy at C8 Improvement from last visit.  [name] radicular symptoms.  Effexor 75 mg seems to be doing relatively well to help with the radicular symptoms in the chronic pain.  Patient did have improvement in range of motion.  Discussed with patient about posture and ergonomics otherwise.  Follow-up again in 6 to 8 weeks for further evaluation.  Has been responding to manipulation but will evaluate at next visit.    Nonallopathic problems  Decision today to treat with OMT was based on Physical Exam  After verbal consent patient was treated with HVLA, ME, FPR techniques in cervical, rib, thoracic, lumbar, and sacral  areas  Patient tolerated the procedure well with improvement in symptoms  Patient given exercises, stretches and lifestyle modifications  See medications in patient instructions if given  Patient will follow up in 4-8 weeks     The above documentation has been reviewed and is accurate and complete Stephen Pulley, DO         Note: This dictation was prepared with Dragon dictation along with smaller phrase technology.  Any transcriptional errors that result from this process are unintentional.

## 2022-01-26 ENCOUNTER — Ambulatory Visit (INDEPENDENT_AMBULATORY_CARE_PROVIDER_SITE_OTHER): Payer: 59 | Admitting: Family Medicine

## 2022-01-26 ENCOUNTER — Encounter: Payer: Self-pay | Admitting: Family Medicine

## 2022-01-26 VITALS — BP 148/82 | HR 75 | Ht 75.0 in | Wt 215.0 lb

## 2022-01-26 DIAGNOSIS — M9904 Segmental and somatic dysfunction of sacral region: Secondary | ICD-10-CM | POA: Diagnosis not present

## 2022-01-26 DIAGNOSIS — M9902 Segmental and somatic dysfunction of thoracic region: Secondary | ICD-10-CM | POA: Diagnosis not present

## 2022-01-26 DIAGNOSIS — M9903 Segmental and somatic dysfunction of lumbar region: Secondary | ICD-10-CM | POA: Diagnosis not present

## 2022-01-26 DIAGNOSIS — M5412 Radiculopathy, cervical region: Secondary | ICD-10-CM | POA: Diagnosis not present

## 2022-01-26 DIAGNOSIS — M9908 Segmental and somatic dysfunction of rib cage: Secondary | ICD-10-CM | POA: Diagnosis not present

## 2022-01-26 DIAGNOSIS — M9901 Segmental and somatic dysfunction of cervical region: Secondary | ICD-10-CM | POA: Diagnosis not present

## 2022-01-26 NOTE — Assessment & Plan Note (Signed)
Improvement from last visit.  No radicular symptoms.  Effexor 75 mg seems to be doing relatively well to help with the radicular symptoms in the chronic pain.  Patient did have improvement in range of motion.  Discussed with patient about posture and ergonomics otherwise.  Follow-up again in 6 to 8 weeks for further evaluation.  Has been responding to manipulation but will evaluate at next visit.

## 2022-01-26 NOTE — Patient Instructions (Signed)
Keep working on posture No change in medicine See me in 6-7 weeks

## 2022-02-03 ENCOUNTER — Other Ambulatory Visit (HOSPITAL_BASED_OUTPATIENT_CLINIC_OR_DEPARTMENT_OTHER): Payer: Self-pay

## 2022-02-04 ENCOUNTER — Other Ambulatory Visit (HOSPITAL_BASED_OUTPATIENT_CLINIC_OR_DEPARTMENT_OTHER): Payer: Self-pay

## 2022-02-17 ENCOUNTER — Other Ambulatory Visit (HOSPITAL_BASED_OUTPATIENT_CLINIC_OR_DEPARTMENT_OTHER): Payer: Self-pay

## 2022-03-04 NOTE — Progress Notes (Deleted)
  [place] Phone: [phone] Subjective:    I'm seeing this patient by the request  of:  [name], MD  CC:   [id number]  [place]. is a 61 y.o. male coming in with complaint of back and neck pain. OMT 01/26/2022. Patient states   Medications patient has been prescribed: Effexor  Taking:         Reviewed prior external information including notes and imaging from previsou exam, outside providers and external EMR if available.   As well as notes that were available from care everywhere and other healthcare systems.  Past medical history, social, surgical and family history all reviewed in electronic medical record.  No pertanent information unless stated regarding to the chief complaint.   Past Medical History:  Diagnosis Date   Anxiety    Colon polyps    Detached retina, right 03/22/1990   Dr. [name] at [facility] did orbital band procedure with good results.    High cholesterol    Hypertension    Poison ivy dermatitis 07/29/2015    No Known Allergies   Review of Systems:  No headache, visual changes, nausea, vomiting, diarrhea, constipation, dizziness, abdominal pain, skin rash, fevers, chills, night sweats, weight loss, swollen lymph nodes, body aches, joint swelling, chest pain, shortness of breath, mood changes. POSITIVE muscle aches  Objective  There were no vitals taken for this visit.   General: No apparent distress alert and oriented x3 mood and affect normal, dressed appropriately.  HEENT: Pupils equal, extraocular movements intact  Respiratory: Patient's speak in full sentences and does not appear short of breath  Cardiovascular: No lower extremity edema, non tender, no erythema  Gait MSK:  Back   Osteopathic findings  C2 flexed rotated and side bent right C6 flexed rotated and side bent left T3 extended rotated and side bent right inhaled rib T9 extended  rotated and side bent left L2 flexed rotated and side bent right Sacrum right on right       Assessment and Plan:  No problem-specific Assessment & Plan notes found for this encounter.    Nonallopathic problems  Decision today to treat with OMT was based on Physical Exam  After verbal consent patient was treated with HVLA, ME, FPR techniques in cervical, rib, thoracic, lumbar, and sacral  areas  Patient tolerated the procedure well with improvement in symptoms  Patient given exercises, stretches and lifestyle modifications  See medications in patient instructions if given  Patient will follow up in 4-8 weeks             Note: This dictation was prepared with Dragon dictation along with smaller phrase technology. Any transcriptional errors that result from this process are unintentional.

## 2022-03-08 ENCOUNTER — Ambulatory Visit (INDEPENDENT_AMBULATORY_CARE_PROVIDER_SITE_OTHER): Payer: 59 | Admitting: Family Medicine

## 2022-03-08 VITALS — BP 130/86 | HR 86 | Ht 75.0 in | Wt 215.0 lb

## 2022-03-08 DIAGNOSIS — M9903 Segmental and somatic dysfunction of lumbar region: Secondary | ICD-10-CM

## 2022-03-08 DIAGNOSIS — M9901 Segmental and somatic dysfunction of cervical region: Secondary | ICD-10-CM

## 2022-03-08 DIAGNOSIS — M9908 Segmental and somatic dysfunction of rib cage: Secondary | ICD-10-CM

## 2022-03-08 DIAGNOSIS — M5412 Radiculopathy, cervical region: Secondary | ICD-10-CM | POA: Diagnosis not present

## 2022-03-08 DIAGNOSIS — M9902 Segmental and somatic dysfunction of thoracic region: Secondary | ICD-10-CM | POA: Diagnosis not present

## 2022-03-08 DIAGNOSIS — M9904 Segmental and somatic dysfunction of sacral region: Secondary | ICD-10-CM

## 2022-03-08 NOTE — Progress Notes (Signed)
[name] [place] Phone: [phone] Subjective:   Stephen [name], am serving as a scribe for Dr. Hulan Saas.  I'm seeing this patient by the request  of:  [name], MD  CC: neck and back pain   [id number]  Stephen [name]. is a 61 y.o. male coming in with complaint of back and neck pain. OMT 01/26/22.  Some tightness in the neck noted.  With the headaches.  Medications patient has been prescribed: Effexor  Taking:      Past Medical History:  Diagnosis Date   Anxiety    Colon polyps    Detached retina, right 03/22/1990   Dr. [name] at [facility] did orbital band procedure with good results.    High cholesterol    Hypertension    Poison ivy dermatitis 07/29/2015   Past Surgical History:  Procedure Laterality Date   COLONOSCOPY  04/2007   Dr.Magod   COLONOSCOPY  09/06/2014   Dr.Magod   HARDWARE REMOVAL Right 08/12/2021   Procedure: RIGHT WRIST REMOVAL OF HARDWARE, EXTENSOR TENODESIS;  Surgeon: [name], MD;  Location: [place];  Service: Orthopedics;  Laterality: Right;   OPEN REDUCTION INTERNAL FIXATION (ORIF) DISTAL RADIAL FRACTURE Right 04/08/2021   Procedure: Right OPEN REDUCTION INTERNAL FIXATION (ORIF) DISTAL RADIAL FRACTURE;  Surgeon: [name], MD;  Location: [place];  Service: Orthopedics;  Laterality: Right;   RETINAL DETACHMENT SURGERY     30 years ago   Social History   Socioeconomic History   Marital status: Married    Spouse name: Not on file   Number of children: 2   Years of education: 16   Highest education level: Not on file  Occupational History   Occupation: busines    Employer: [employer]  Tobacco Use   Smoking status: Never   Smokeless tobacco: Never  Vaping Use   Vaping Use: Never used  Substance and Sexual Activity   Alcohol use: Yes    Comment: occ beer   Drug use: No   Sexual activity: Yes    Partners: Female  Other  Topics Concern   Not on file  Social History Narrative   HSG, [employer] - BS business admin. Married '84 - 1 son '86, 1 dtr -'5. 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 ACP discussed and he is referred to [email or web address].   Social Determinants of Health   Financial Resource Strain: Not on file  Food Insecurity: Not on file  Transportation Needs: Not on file  Physical Activity: Not on file  Stress: Not on file  Social Connections: Not on file   No Known Allergies Family History  Problem Relation Age of Onset   Parkinsonism Mother    Colon polyps Father    Colon cancer Father 89   Cancer Father        colon cancer with mets   Hyperlipidemia Father    Hypertension Father    Hypertension Brother    Diabetes Neg Hx    Heart disease Neg Hx    Esophageal cancer Neg Hx    Rectal cancer Neg Hx    Stomach cancer Neg Hx      Current Outpatient Medications (Cardiovascular):    lisinopril (ZESTRIL) 20 MG tablet, TAKE 1 TABLET BY MOUTH ONCE DAILY   pravastatin (PRAVACHOL) 40 MG tablet, TAKE 1 TABLET BY MOUTH EVERY NIGHT AT BEDTIME  Current Outpatient Medications (Other):    Multiple Vitamin (MULTIVITAMIN) tablet, Take 1 tablet by mouth daily.   Omega-3 Fatty Acids (SALMON OIL-1000 PO), Take 1,000 mg by mouth daily.   venlafaxine XR (EFFEXOR-XR) 37.5 MG 24 hr capsule, TAKE 2 CAPSULES BY MOUTH ONCE DAILY WITH BREAKFAST   Reviewed prior external information including notes and imaging from  primary care provider As well as notes that were available from care everywhere and other healthcare systems.  Past medical history, social, surgical and family history all reviewed in electronic medical record.  No pertanent information unless stated regarding to the chief complaint.   Review of Systems:  No , visual changes, nausea, vomiting, diarrhea, constipation, dizziness, abdominal pain, skin rash,  fevers, chills, night sweats, weight loss, swollen lymph nodes, body aches, joint swelling, chest pain, shortness of breath, mood changes. POSITIVE muscle aches, headache  Objective  Blood pressure 130/86, pulse 86, height '6\' 3"'$  (1.905 m), weight 215 lb (97.5 kg), SpO2 97 %.   General: No apparent distress alert and oriented x3 mood and affect normal, dressed appropriately.  HEENT: Pupils equal, extraocular movements intact  Respiratory: Patient's speak in full sentences and does not appear short of breath  Cardiovascular: No lower extremity edema, non tender, no erythema  Neck exam does have some loss of lordosis.  Some tenderness to palpation in the paraspinal musculature. Difficulty with sidebending bilaterally.    Osteopathic findings C2 flexed rotated and side bent right C4 flexed rotated and side bent left C6 flexed rotated and side bent right T5 extended rotated and side bent right inhaled third rib T11 extended rotated and side bent left L5 flexed rotated and side bent left Sacrum right on right     Impression and Recommendations:     The above documentation has been reviewed and is accurate and complete Lyndal Pulley, DO  Cervical radiculopathy at C8 Significant tightness in the neck that can contribute to some of his headaches as well.  Chronic problem with worsening symptoms.  Discussed icing regimen and home exercises, discussed which activities to do and which ones to avoid.  Increase activity slowly over the course the next several days.  Patient will continue with the Effexor at the same dosing.  Follow-up again in 6 to 8 weeks otherwise.     Decision today to treat with OMT was based on Physical Exam  After verbal consent patient was treated with HVLA, ME, FPR techniques in cervical, thoracic, rib, lumbar and sacral areas, all areas are chronic   Patient tolerated the procedure well with improvement in symptoms  Patient given exercises, stretches and lifestyle  modifications  See medications in patient instructions if given  Patient will follow up in 4-8 weeks

## 2022-03-08 NOTE — Assessment & Plan Note (Signed)
Significant tightness in the neck that can contribute to some of his headaches as well.  Chronic problem with worsening symptoms.  Discussed icing regimen and home exercises, discussed which activities to do and which ones to avoid.  Increase activity slowly over the course the next several days.  Patient will continue with the Effexor at the same dosing.  Follow-up again in 6 to 8 weeks otherwise.

## 2022-03-08 NOTE — Patient Instructions (Addendum)
Good to see you  Careful having grandkid jump on your skull Follow up in 6 weeks

## 2022-03-09 ENCOUNTER — Ambulatory Visit: Payer: 59 | Admitting: Family Medicine

## 2022-04-01 ENCOUNTER — Other Ambulatory Visit (HOSPITAL_BASED_OUTPATIENT_CLINIC_OR_DEPARTMENT_OTHER): Payer: Self-pay

## 2022-04-01 MED ORDER — COMIRNATY 30 MCG/0.3ML IM SUSY
PREFILLED_SYRINGE | INTRAMUSCULAR | 0 refills | Status: DC
Start: 2022-04-01 — End: 2022-10-28
  Filled 2022-04-01: qty 0.3, 1d supply, fill #0

## 2022-04-16 NOTE — Progress Notes (Unsigned)
Stephen [name] [address] [place] Phone: [phone] Subjective:   Stephen [name], am serving as a scribe for Dr. Hulan Saas.  I'm seeing this patient by the request  of:  [name], MD  CC: Back and neck pain follow-up  [id number]  Stephen [name]. is a 62 y.o. male coming in with complaint of back and neck pain. OMT 03/08/2022. Patient states doing well since last visit. No new issues.  Patient states that the winter months have actually gotten better than usual.  Nothing specific at this time.  Medications patient has been prescribed: Effexor  Taking:         Reviewed prior external information including notes and imaging from previsou exam, outside providers and external EMR if available.   As well as notes that were available from care everywhere and other healthcare systems.  Past medical history, social, surgical and family history all reviewed in electronic medical record.  No pertanent information unless stated regarding to the chief complaint.   Past Medical History:  Diagnosis Date   Anxiety    Colon polyps    Detached retina, right 03/22/1990   Dr. [name] at [facility] did orbital band procedure with good results.    High cholesterol    Hypertension    Poison ivy dermatitis 07/29/2015    No Known Allergies   Review of Systems:  No headache, visual changes, nausea, vomiting, diarrhea, constipation, dizziness, abdominal pain, skin rash, fevers, chills, night sweats, weight loss, swollen lymph nodes, body aches, joint swelling, chest pain, shortness of breath, mood changes. POSITIVE muscle aches  Objective  Blood pressure 122/72, pulse 89, height '6\' 3"'$  (1.905 m), weight 212 lb (96.2 kg), SpO2 96 %.   General: No apparent distress alert and oriented x3 mood and affect normal, dressed appropriately.  HEENT: Pupils equal, extraocular movements intact  Respiratory: Patient's speak in full sentences  and does not appear short of breath  Cardiovascular: No lower extremity edema, non tender, no erythema  Low back exam does have some loss lordosis.  Some tenderness to palpation in the paraspinal musculature.  Tightness in the neck noted.  Only with sidebending to the right.  Negative Spurling's.  Osteopathic findings  C2 flexed rotated and side bent right C5 flexed rotated and side bent left T3 extended rotated and side bent right inhaled rib L1 flexed rotated and side bent left Sacrum right on right       Assessment and Plan:  Cervical radiculopathy at C8 Patient continues to do well with the Effexor.  No significant radicular symptoms noted at this time.  Discussed icing regimen and home exercises, icing regimen that I think will be beneficial still.  No change in the medication.  Follow-up again in 2 months    Nonallopathic problems  Decision today to treat with OMT was based on Physical Exam  After verbal consent patient was treated with HVLA, ME, FPR techniques in cervical, rib, thoracic, lumbar, and sacral  areas  Patient tolerated the procedure well with improvement in symptoms  Patient given exercises, stretches and lifestyle modifications  See medications in patient instructions if given  Patient will follow up in 8 weeks    The above documentation has been reviewed and is accurate and complete Lyndal Pulley, DO          Note: This dictation was prepared with Dragon dictation along with smaller phrase technology. Any transcriptional errors that result from this process  are unintentional.

## 2022-04-20 ENCOUNTER — Ambulatory Visit: Payer: 59 | Admitting: Family Medicine

## 2022-04-20 VITALS — BP 122/72 | HR 89 | Ht 75.0 in | Wt 212.0 lb

## 2022-04-20 DIAGNOSIS — M5412 Radiculopathy, cervical region: Secondary | ICD-10-CM | POA: Diagnosis not present

## 2022-04-20 DIAGNOSIS — M9901 Segmental and somatic dysfunction of cervical region: Secondary | ICD-10-CM

## 2022-04-20 DIAGNOSIS — M9902 Segmental and somatic dysfunction of thoracic region: Secondary | ICD-10-CM | POA: Diagnosis not present

## 2022-04-20 DIAGNOSIS — M9904 Segmental and somatic dysfunction of sacral region: Secondary | ICD-10-CM

## 2022-04-20 DIAGNOSIS — M9908 Segmental and somatic dysfunction of rib cage: Secondary | ICD-10-CM

## 2022-04-20 DIAGNOSIS — M9903 Segmental and somatic dysfunction of lumbar region: Secondary | ICD-10-CM

## 2022-04-20 NOTE — Assessment & Plan Note (Signed)
Patient continues to do well with the Effexor.  No significant radicular symptoms noted at this time.  Discussed icing regimen and home exercises, icing regimen that I think will be beneficial still.  No change in the medication.  Follow-up again in 2 months

## 2022-04-20 NOTE — Patient Instructions (Signed)
See you again in 2 months Tell the family hi Stephen [name] doing great

## 2022-05-13 ENCOUNTER — Other Ambulatory Visit (HOSPITAL_BASED_OUTPATIENT_CLINIC_OR_DEPARTMENT_OTHER): Payer: Self-pay

## 2022-05-13 ENCOUNTER — Other Ambulatory Visit: Payer: Self-pay | Admitting: Family Medicine

## 2022-05-13 MED ORDER — VENLAFAXINE HCL ER 37.5 MG PO CP24
75.0000 mg | ORAL_CAPSULE | Freq: Every day | ORAL | 1 refills | Status: DC
  Filled 2022-05-13: qty 180, 90d supply, fill #0
  Filled 2022-08-11: qty 180, 90d supply, fill #1

## 2022-06-04 ENCOUNTER — Other Ambulatory Visit (HOSPITAL_BASED_OUTPATIENT_CLINIC_OR_DEPARTMENT_OTHER): Payer: Self-pay

## 2022-06-14 NOTE — Progress Notes (Signed)
  [name]: [phone] Subjective:   Fontaine No, am serving as a scribe for Dr. Hulan Saas.  I'm seeing this patient by the request  of:  [name], MD  CC: Low back and neck pain follow-up  [id number]  Stephen [name]. is a 62 y.o. male coming in with complaint of back and neck pain. OMT 04/20/2022. Patient states continues to have some discomfort.  Has some tightness recently.  Has been playing with some of the grandkids.  Medications patient has been prescribed: Effexor  Taking: Yes       Past Medical History:  Diagnosis Date   Anxiety    Colon polyps    Detached retina, right 03/22/1990   Dr. [name] at [facility] did orbital band procedure with good results.    High cholesterol    Hypertension    Poison ivy dermatitis 07/29/2015    No Known Allergies   Review of Systems:  No headache, visual changes, nausea, vomiting, diarrhea, constipation, dizziness, abdominal pain, skin rash, fevers, chills, night sweats, weight loss, swollen lymph nodes, body aches, joint swelling, chest pain, shortness of breath, mood changes. POSITIVE muscle aches  Objective  Blood pressure 128/64, pulse 73, height 6\' 3"  (1.905 m), weight 214 lb (97.1 kg), SpO2 96 %.   General: No apparent distress alert and oriented x3 mood and affect normal, dressed appropriately.  HEENT: Pupils equal, extraocular movements intact  Respiratory: Patient's speak in full sentences and does not appear short of breath  Cardiovascular: No lower extremity edema, non tender, no erythema  Neck does have some loss of radiation   Osteopathic findings  C2 flexed rotated and side bent right C7 flexed rotated and side bent left T3 extended rotated and side bent right inhaled rib T8 extended rotated and side bent left L1 flexed rotated and side bent right Sacrum right on right       Assessment and Plan:  Cervical  radiculopathy at C8 No radicular symptoms at the moment.  Some tightness of the neck noted.  Patient is on the Effexor and has done relatively well with this.  No significant side effects.  Increase activity slowly.  Follow-up with me again in 6 to 8 weeks    Nonallopathic problems  Decision today to treat with OMT was based on Physical Exam  After verbal consent patient was treated with HVLA, ME, FPR techniques in cervical, rib, thoracic, lumbar, and sacral  areas  Patient tolerated the procedure well with improvement in symptoms  Patient given exercises, stretches and lifestyle modifications  See medications in patient instructions if given  Patient will follow up in 4-8 weeks    The above documentation has been reviewed and is accurate and complete Lyndal Pulley, DO          Note: This dictation was prepared with Dragon dictation along with smaller phrase technology. Any transcriptional errors that result from this process are unintentional.

## 2022-06-23 ENCOUNTER — Ambulatory Visit: Payer: 59 | Admitting: Family Medicine

## 2022-06-23 ENCOUNTER — Encounter: Payer: Self-pay | Admitting: Family Medicine

## 2022-06-23 VITALS — BP 128/64 | HR 73 | Ht 75.0 in | Wt 214.0 lb

## 2022-06-23 DIAGNOSIS — M5412 Radiculopathy, cervical region: Secondary | ICD-10-CM

## 2022-06-23 DIAGNOSIS — M9908 Segmental and somatic dysfunction of rib cage: Secondary | ICD-10-CM

## 2022-06-23 DIAGNOSIS — M9902 Segmental and somatic dysfunction of thoracic region: Secondary | ICD-10-CM | POA: Diagnosis not present

## 2022-06-23 DIAGNOSIS — M9901 Segmental and somatic dysfunction of cervical region: Secondary | ICD-10-CM | POA: Diagnosis not present

## 2022-06-23 DIAGNOSIS — M9903 Segmental and somatic dysfunction of lumbar region: Secondary | ICD-10-CM | POA: Diagnosis not present

## 2022-06-23 DIAGNOSIS — M9904 Segmental and somatic dysfunction of sacral region: Secondary | ICD-10-CM | POA: Diagnosis not present

## 2022-06-23 NOTE — Patient Instructions (Signed)
Have fun in [place] See me again in 6-8 weeks

## 2022-06-23 NOTE — Assessment & Plan Note (Signed)
No radicular symptoms at the moment.  Some tightness of the neck noted.  Patient is on the Effexor and has done relatively well with this.  No significant side effects.  Increase activity slowly.  Follow-up with me again in 6 to 8 weeks

## 2022-06-28 ENCOUNTER — Other Ambulatory Visit (HOSPITAL_BASED_OUTPATIENT_CLINIC_OR_DEPARTMENT_OTHER): Payer: Self-pay

## 2022-08-17 NOTE — Progress Notes (Unsigned)
[name] [address] [place] [address] Phone: [phone] Subjective:   Stephen [name], am serving as a scribe for Dr. Antoine Primas.  I'm seeing this patient by the request  of:  Mliss Sax, MD  CC: Back and neck pain follow-up  [id number]  Stephen [name]. is a 62 y.o. male coming in with complaint of back and neck pain. OMT 06/23/2022. Patient states doing well. Same per usual. No new concerns.  Medications patient has been prescribed: Effexor  Taking: Yes         Reviewed prior external information including notes and imaging from previsou exam, outside providers and external EMR if available.   As well as notes that were available from care everywhere and other healthcare systems.  Past medical history, social, surgical and family history all reviewed in electronic medical record.  No pertanent information unless stated regarding to the chief complaint.   Past Medical History:  Diagnosis Date   Anxiety    Colon polyps    Detached retina, right 03/22/1990   Dr. Jenkins Rouge at [facility] did orbital band procedure with good results.    High cholesterol    Hypertension    Poison ivy dermatitis 07/29/2015    No Known Allergies   Review of Systems:  No headache, visual changes, nausea, vomiting, diarrhea, constipation, dizziness, abdominal pain, skin rash, fevers, chills, night sweats, weight loss, swollen lymph nodes, body aches, joint swelling, chest pain, shortness of breath, mood changes. POSITIVE muscle aches  Objective  Blood pressure 126/64, pulse 82, height 6\' 3"  (1.905 m), weight 210 lb (95.3 kg), SpO2 96 %.   General: No apparent distress alert and oriented x3 mood and affect normal, dressed appropriately.  HEENT: Pupils equal, extraocular movements intact  Respiratory: Patient's speak in full sentences and does not appear short of breath  Cardiovascular: No lower extremity edema, non tender, no erythema   Neck exam does have some mild loss of lordosis noted.  Tenderness to palpation noted in the cervical thoracic.  Osteopathic findings  C2 flexed rotated and side bent right C7 flexed rotated and side bent left T1 extended rotated and side bent right T3 extended rotated and side bent right inhaled rib T9 extended rotated and side bent left L2 flexed rotated and side bent right Sacrum right on right       Assessment and Plan:  Cervical radiculopathy at C8 Stable overall.  Discussed icing regimen and home exercises still.  Discussed the Effexor that does seem to be helpful.  Discussed avoiding certain activities that I think will be helpful as well.  Increase activity slowly over the course of next several weeks.  Follow-up with me again in 6 to 8 weeks    Nonallopathic problems  Decision today to treat with OMT was based on Physical Exam  After verbal consent patient was treated with HVLA, ME, FPR techniques in cervical, rib, thoracic, lumbar, and sacral  areas  Patient tolerated the procedure well with improvement in symptoms  Patient given exercises, stretches and lifestyle modifications  See medications in patient instructions if given  Patient will follow up in 4-8 weeks    The above documentation has been reviewed and is accurate and complete Judi Saa, DO          Note: This dictation was prepared with Dragon dictation along with smaller phrase technology. Any transcriptional errors that result from this process are unintentional.

## 2022-08-18 ENCOUNTER — Encounter: Payer: Self-pay | Admitting: Family Medicine

## 2022-08-18 ENCOUNTER — Ambulatory Visit: Payer: 59 | Admitting: Family Medicine

## 2022-08-18 VITALS — BP 126/64 | HR 82 | Ht 75.0 in | Wt 210.0 lb

## 2022-08-18 DIAGNOSIS — M9904 Segmental and somatic dysfunction of sacral region: Secondary | ICD-10-CM | POA: Diagnosis not present

## 2022-08-18 DIAGNOSIS — M9901 Segmental and somatic dysfunction of cervical region: Secondary | ICD-10-CM | POA: Diagnosis not present

## 2022-08-18 DIAGNOSIS — M5412 Radiculopathy, cervical region: Secondary | ICD-10-CM

## 2022-08-18 DIAGNOSIS — M9908 Segmental and somatic dysfunction of rib cage: Secondary | ICD-10-CM | POA: Diagnosis not present

## 2022-08-18 DIAGNOSIS — M9902 Segmental and somatic dysfunction of thoracic region: Secondary | ICD-10-CM

## 2022-08-18 DIAGNOSIS — M9903 Segmental and somatic dysfunction of lumbar region: Secondary | ICD-10-CM | POA: Diagnosis not present

## 2022-08-18 NOTE — Patient Instructions (Signed)
Good to see you! Stay active If anything worsens you know where we are See you again in 7-8 weeks

## 2022-08-18 NOTE — Assessment & Plan Note (Signed)
Stable overall.  Discussed icing regimen and home exercises still.  Discussed the Effexor that does seem to be helpful.  Discussed avoiding certain activities that I think will be helpful as well.  Increase activity slowly over the course of next several weeks.  Follow-up with me again in 6 to 8 weeks

## 2022-10-05 NOTE — Progress Notes (Signed)
  [name] [address] [place] [address] Phone: [phone] Subjective:   Stephen [name], am serving as a scribe for Dr. Antoine Primas.  I'm seeing this patient by the request  of:  Mliss Sax, MD  CC: Back and neck pain f/u  [id number]  Stephen [name]. is a 62 y.o. male coming in with complaint of back and neck pain. OMT 08/18/2022. Patient states same per usual. No new concerns.  Has been fairly active.  Been playing with his grandson for some time.  No significant radiation  Medications patient has been prescribed: Effexor  Taking:         Reviewed prior external information including notes and imaging from previsou exam, outside providers and external EMR if available.   As well as notes that were available from care everywhere and other healthcare systems.  Past medical history, social, surgical and family history all reviewed in electronic medical record.  No pertanent information unless stated regarding to the chief complaint.   Past Medical History:  Diagnosis Date   Anxiety    Colon polyps    Detached retina, right 03/22/1990   Dr. Jenkins Rouge at [facility] did orbital band procedure with good results.    High cholesterol    Hypertension    Poison ivy dermatitis 07/29/2015    No Known Allergies   Review of Systems:  No headache, visual changes, nausea, vomiting, diarrhea, constipation, dizziness, abdominal pain, skin rash, fevers, chills, night sweats, weight loss, swollen lymph nodes, body aches, joint swelling, chest pain, shortness of breath, mood changes. POSITIVE muscle aches  Objective  Blood pressure 126/84, pulse 71, height 6\' 3"  (1.905 m), weight 208 lb (94.3 kg), SpO2 98%.   General: No apparent distress alert and oriented x3 mood and affect normal, dressed appropriately.  HEENT: Pupils equal, extraocular movements intact  Respiratory: Patient's speak in full sentences and does not appear short of  breath  Cardiovascular: No lower extremity edema, non tender, no erythema    Osteopathic findings  C2 flexed rotated and side bent left C4 flexed rotated and side bent left T3 extended rotated and side bent left  inhaled rib T7 extended rotated and side bent left L2 flexed rotated and side bent right L4 flexed rotated and side left  Sacrum right on right     Assessment and Plan:  Cervical radiculopathy at C8 Stable overall.  More of the upper cervical spine today that was getting more difficulty.  Seems to be more left greater than right.  Describes hematologic ergonomics.  Follow-up with me again in 6 to 8 weeks.    Nonallopathic problems  Decision today to treat with OMT was based on Physical Exam  After verbal consent patient was treated with HVLA, ME, FPR techniques in cervical, rib, thoracic, lumbar, and sacral  areas  Patient tolerated the procedure well with improvement in symptoms  Patient given exercises, stretches and lifestyle modifications  See medications in patient instructions if given  Patient will follow up in 4-8 weeks    The above documentation has been reviewed and is accurate and complete Judi Saa, DO          Note: This dictation was prepared with Dragon dictation along with smaller phrase technology. Any transcriptional errors that result from this process are unintentional.

## 2022-10-07 ENCOUNTER — Ambulatory Visit: Payer: 59 | Admitting: Family Medicine

## 2022-10-07 ENCOUNTER — Encounter: Payer: Self-pay | Admitting: Family Medicine

## 2022-10-07 VITALS — BP 126/84 | HR 71 | Ht 75.0 in | Wt 208.0 lb

## 2022-10-07 DIAGNOSIS — M9904 Segmental and somatic dysfunction of sacral region: Secondary | ICD-10-CM | POA: Diagnosis not present

## 2022-10-07 DIAGNOSIS — M9901 Segmental and somatic dysfunction of cervical region: Secondary | ICD-10-CM | POA: Diagnosis not present

## 2022-10-07 DIAGNOSIS — M5412 Radiculopathy, cervical region: Secondary | ICD-10-CM | POA: Diagnosis not present

## 2022-10-07 DIAGNOSIS — M9902 Segmental and somatic dysfunction of thoracic region: Secondary | ICD-10-CM | POA: Diagnosis not present

## 2022-10-07 DIAGNOSIS — M9903 Segmental and somatic dysfunction of lumbar region: Secondary | ICD-10-CM

## 2022-10-07 DIAGNOSIS — M9908 Segmental and somatic dysfunction of rib cage: Secondary | ICD-10-CM | POA: Diagnosis not present

## 2022-10-07 NOTE — Patient Instructions (Signed)
Good to see you! Enjoy the beach Keep doing whatever you are doing See you again in 2 months

## 2022-10-07 NOTE — Assessment & Plan Note (Signed)
Stable overall.  More of the upper cervical spine today that was getting more difficulty.  Seems to be more left greater than right.  Describes hematologic ergonomics.  Follow-up with me again in 6 to 8 weeks.

## 2022-10-28 ENCOUNTER — Encounter: Payer: Self-pay | Admitting: Family Medicine

## 2022-10-28 ENCOUNTER — Other Ambulatory Visit (HOSPITAL_BASED_OUTPATIENT_CLINIC_OR_DEPARTMENT_OTHER): Payer: Self-pay

## 2022-10-28 ENCOUNTER — Ambulatory Visit (INDEPENDENT_AMBULATORY_CARE_PROVIDER_SITE_OTHER): Payer: 59 | Admitting: Family Medicine

## 2022-10-28 VITALS — BP 139/85 | HR 75 | Resp 18 | Ht 75.0 in | Wt 208.5 lb

## 2022-10-28 DIAGNOSIS — I10 Essential (primary) hypertension: Secondary | ICD-10-CM

## 2022-10-28 DIAGNOSIS — G542 Cervical root disorders, not elsewhere classified: Secondary | ICD-10-CM | POA: Diagnosis not present

## 2022-10-28 DIAGNOSIS — Z23 Encounter for immunization: Secondary | ICD-10-CM

## 2022-10-28 DIAGNOSIS — E782 Mixed hyperlipidemia: Secondary | ICD-10-CM

## 2022-10-28 MED ORDER — PRAVASTATIN SODIUM 40 MG PO TABS
40.0000 mg | ORAL_TABLET | Freq: Every day | ORAL | 4 refills | Status: DC
  Filled 2022-10-28: qty 90, fill #0
  Filled 2022-11-18: qty 90, 90d supply, fill #0
  Filled 2023-02-12: qty 90, 90d supply, fill #1
  Filled 2023-05-25: qty 90, 90d supply, fill #2
  Filled 2023-08-18: qty 90, 90d supply, fill #3

## 2022-10-28 MED ORDER — VENLAFAXINE HCL ER 37.5 MG PO CP24
75.0000 mg | ORAL_CAPSULE | Freq: Every day | ORAL | 1 refills | Status: DC
  Filled 2022-10-28 – 2022-11-09 (×2): qty 180, 90d supply, fill #0
  Filled 2023-02-09: qty 180, 90d supply, fill #1

## 2022-10-28 MED ORDER — ONE-DAILY MULTI VITAMINS PO TABS
1.0000 | ORAL_TABLET | Freq: Every day | ORAL | 1 refills | Status: AC
Start: 2022-10-28 — End: ?
  Filled 2022-10-28: qty 90, 90d supply, fill #0

## 2022-10-28 MED ORDER — LISINOPRIL 20 MG PO TABS
20.0000 mg | ORAL_TABLET | Freq: Every day | ORAL | 3 refills | Status: DC
Start: 2022-10-28 — End: 2023-12-01
  Filled 2022-10-28 – 2022-11-18 (×2): qty 90, 90d supply, fill #0
  Filled 2023-02-12: qty 90, 90d supply, fill #1
  Filled 2023-05-25: qty 90, 90d supply, fill #2
  Filled 2023-08-18: qty 90, 90d supply, fill #3

## 2022-10-28 NOTE — Assessment & Plan Note (Signed)
Patient on Effexor for radiculopathy and has been tolerating this well.  Will go ahead and refill

## 2022-10-28 NOTE — Progress Notes (Signed)
New patient visit   Patient: Stephen [name].   DOB: 01-18-1961   62 y.o. Male  MRN: [id number] Visit Date: 10/28/2022  Today's healthcare provider: Charlton Amor, DO   Chief Complaint  Patient presents with   New Patient (Initial Visit)    Establish care     SUBJECTIVE    Chief Complaint  Patient presents with   New Patient (Initial Visit)    Establish care    HPI HPI     New Patient (Initial Visit)    Additional comments: Establish care       Last edited by Roselyn Reef, CMA on 10/28/2022 10:49 AM.      Pt presents to establish care.   Pmh significant for HTN  - on lisinopril 20mg    HLD - on pravastatin 40mg    On effexor 37.5mg  for radiculopathy that has been working well for him  Review of Systems  Constitutional:  Negative for activity change, fatigue and fever.  Respiratory:  Negative for cough and shortness of breath.   Cardiovascular:  Negative for chest pain.  Gastrointestinal:  Negative for abdominal pain.  Genitourinary:  Negative for difficulty urinating.       Current Meds  Medication Sig   Omega-3 Fatty Acids (SALMON OIL-1000 PO) Take 1,000 mg by mouth daily.   [DISCONTINUED] lisinopril (ZESTRIL) 20 MG tablet TAKE 1 TABLET BY MOUTH ONCE DAILY   [DISCONTINUED] Multiple Vitamin (MULTIVITAMIN) tablet Take 1 tablet by mouth daily.   [DISCONTINUED] pravastatin (PRAVACHOL) 40 MG tablet TAKE 1 TABLET BY MOUTH EVERY NIGHT AT BEDTIME   [DISCONTINUED] venlafaxine XR (EFFEXOR-XR) 37.5 MG 24 hr capsule Take 2 capsules (75 mg total) by mouth daily with breakfast.    OBJECTIVE    BP 139/85 (BP Location: Left Arm, Patient Position: Sitting, Cuff Size: Large)   Pulse 75   Resp 18   Ht 6\' 3"  (1.905 m)   Wt 208 lb 8 oz (94.6 kg)   SpO2 100%   BMI 26.06 kg/m   Physical Exam Vitals and nursing note reviewed.  Constitutional:      General: He is not in acute distress.    Appearance: Normal appearance.  HENT:     Head: Normocephalic and  atraumatic.     Right Ear: External ear normal.     Left Ear: External ear normal.     Nose: Nose normal.  Eyes:     Conjunctiva/sclera: Conjunctivae normal.  Cardiovascular:     Rate and Rhythm: Normal rate and regular rhythm.  Pulmonary:     Effort: Pulmonary effort is normal.     Breath sounds: Normal breath sounds.  Neurological:     General: No focal deficit present.     Mental Status: He is alert and oriented to person, place, and time.  Psychiatric:        Mood and Affect: Mood normal.        Behavior: Behavior normal.        Thought Content: Thought content normal.        Judgment: Judgment normal.          ASSESSMENT & PLAN    Problem List Items Addressed This Visit       Cardiovascular and Mediastinum   Essential hypertension    Patient well-controlled on blood pressure medicine.  Will go ahead and refill his medication      Relevant Medications   pravastatin (PRAVACHOL) 40 MG tablet   Multiple Vitamin (MULTIVITAMIN) tablet  lisinopril (ZESTRIL) 20 MG tablet     Nervous and Auditory   Neuropathy, cervical plexus - Primary    Patient on Effexor for radiculopathy and has been tolerating this well.  Will go ahead and refill      Relevant Medications   venlafaxine XR (EFFEXOR-XR) 37.5 MG 24 hr capsule     Other   Mixed hyperlipidemia    Patient has a history of hyperlipidemia we will go ahead and refill his pravastatin.      Relevant Medications   pravastatin (PRAVACHOL) 40 MG tablet   lisinopril (ZESTRIL) 20 MG tablet    Return if symptoms worsen or fail to improve.   Patient is coming in tomorrow for a wellness exam  Meds ordered this encounter  Medications   venlafaxine XR (EFFEXOR-XR) 37.5 MG 24 hr capsule    Sig: Take 2 capsules (75 mg total) by mouth daily with breakfast.    Dispense:  180 capsule    Refill:  1   pravastatin (PRAVACHOL) 40 MG tablet    Sig: TAKE 1 TABLET BY MOUTH EVERY NIGHT AT BEDTIME    Dispense:  90 tablet     Refill:  4   Multiple Vitamin (MULTIVITAMIN) tablet    Sig: Take 1 tablet by mouth daily.    Dispense:  90 tablet    Refill:  1   lisinopril (ZESTRIL) 20 MG tablet    Sig: Take 1 tablet (20 mg total) by mouth daily.    Dispense:  90 tablet    Refill:  3    Orders Placed This Encounter  Procedures   Zoster Recombinant (Shingrix )     Charlton Amor, DO  [facility] at [facility] [phone] (phone) [phone] (fax)  [facility]

## 2022-10-28 NOTE — Assessment & Plan Note (Signed)
Patient well-controlled on blood pressure medicine.  Will go ahead and refill his medication

## 2022-10-28 NOTE — Assessment & Plan Note (Signed)
Patient has a history of hyperlipidemia we will go ahead and refill his pravastatin.

## 2022-10-29 ENCOUNTER — Encounter: Payer: Self-pay | Admitting: Family Medicine

## 2022-10-29 ENCOUNTER — Ambulatory Visit (INDEPENDENT_AMBULATORY_CARE_PROVIDER_SITE_OTHER): Payer: 59 | Admitting: Family Medicine

## 2022-10-29 VITALS — BP 148/87 | HR 63 | Resp 18 | Ht 75.0 in | Wt 209.2 lb

## 2022-10-29 DIAGNOSIS — Z0000 Encounter for general adult medical examination without abnormal findings: Secondary | ICD-10-CM

## 2022-10-29 DIAGNOSIS — Z13220 Encounter for screening for lipoid disorders: Secondary | ICD-10-CM | POA: Diagnosis not present

## 2022-10-29 DIAGNOSIS — Z125 Encounter for screening for malignant neoplasm of prostate: Secondary | ICD-10-CM

## 2022-10-29 DIAGNOSIS — R739 Hyperglycemia, unspecified: Secondary | ICD-10-CM | POA: Diagnosis not present

## 2022-10-29 NOTE — Progress Notes (Signed)
Established patient visit   Patient: Stephen [name] Doctor.   DOB: 1960-11-13   62 y.o. Male  MRN: [id number] Visit Date: 10/29/2022  Today's healthcare provider: Charlton Amor, DO   Chief Complaint  Patient presents with   Annual Exam    SUBJECTIVE    Chief Complaint  Patient presents with   Annual Exam   HPI  Patient presents for wellness visit today.  He has no concerns.  History of hypertension in his dad.  No one in his family has diabetes.  Cancer significant in dad that is colon cancer.  Lives with his wife and daughter feels safe at home.  No concerns of depression or anxiety.  Rarely drinks alcohol and has never smoked.  Review of Systems  Constitutional:  Negative for activity change, fatigue and fever.  Respiratory:  Negative for cough and shortness of breath.   Cardiovascular:  Negative for chest pain.  Gastrointestinal:  Negative for abdominal pain.  Genitourinary:  Negative for difficulty urinating.  All other systems reviewed and are negative.      Current Meds  Medication Sig   lisinopril (ZESTRIL) 20 MG tablet Take 1 tablet (20 mg total) by mouth daily.   Multiple Vitamin (MULTIVITAMIN) tablet Take 1 tablet by mouth daily.   Omega-3 Fatty Acids (SALMON OIL-1000 PO) Take 1,000 mg by mouth daily.   pravastatin (PRAVACHOL) 40 MG tablet TAKE 1 TABLET BY MOUTH EVERY NIGHT AT BEDTIME   venlafaxine XR (EFFEXOR-XR) 37.5 MG 24 hr capsule Take 2 capsules (75 mg total) by mouth daily with breakfast.    OBJECTIVE    BP (!) 148/87 (BP Location: Left Arm, Patient Position: Sitting, Cuff Size: Large)   Pulse 63   Resp 18   Ht 6\' 3"  (1.905 m)   Wt 209 lb 4 oz (94.9 kg)   SpO2 100%   BMI 26.15 kg/m   Physical Exam Vitals and nursing note reviewed.  Constitutional:      General: He is not in acute distress.    Appearance: Normal appearance.  HENT:     Head: Normocephalic and atraumatic.     Right Ear: External ear normal.     Left Ear: External ear  normal.     Nose: Nose normal.  Eyes:     Conjunctiva/sclera: Conjunctivae normal.  Cardiovascular:     Rate and Rhythm: Normal rate and regular rhythm.  Pulmonary:     Effort: Pulmonary effort is normal.     Breath sounds: Normal breath sounds.  Abdominal:     General: Abdomen is flat. Bowel sounds are normal.     Palpations: Abdomen is soft.  Musculoskeletal:     Comments: 5 out of 5 muscle strength upper and lower extremities bilaterally  Neurological:     General: No focal deficit present.     Mental Status: He is alert and oriented to person, place, and time.  Psychiatric:        Mood and Affect: Mood normal.        Behavior: Behavior normal.        Thought Content: Thought content normal.        Judgment: Judgment normal.         ASSESSMENT & PLAN    Problem List Items Addressed This Visit   None Visit Diagnoses     Routine adult health maintenance    -  Primary   Relevant Orders   CBC   Lipid panel   Basic Metabolic  Panel (BMET)   PSA   Screening for prostate cancer       Relevant Orders   PSA   Screening for lipid disorders       Relevant Orders   Lipid panel       No follow-ups on file.      No orders of the defined types were placed in this encounter.   Orders Placed This Encounter  Procedures   CBC   Lipid panel    Order Specific Question:   Has the patient fasted?    Answer:   No    Order Specific Question:   Release to patient    Answer:   Immediate   Basic Metabolic Panel (BMET)    Order Specific Question:   Has the patient fasted?    Answer:   No   PSA     Charlton Amor, DO  [facility] at [facility] [phone] (phone) [phone] (fax)  [facility]

## 2022-11-02 LAB — SPECIMEN STATUS REPORT

## 2022-11-02 LAB — HGB A1C W/O EAG

## 2022-11-10 ENCOUNTER — Other Ambulatory Visit (HOSPITAL_BASED_OUTPATIENT_CLINIC_OR_DEPARTMENT_OTHER): Payer: Self-pay

## 2022-11-18 ENCOUNTER — Other Ambulatory Visit (HOSPITAL_BASED_OUTPATIENT_CLINIC_OR_DEPARTMENT_OTHER): Payer: Self-pay

## 2022-12-07 NOTE — Progress Notes (Unsigned)
[name] [address] [place] [address] Phone: [phone] Subjective:   [name], am serving as a scribe for Dr. Antoine Primas.  I'm seeing this patient by the request  of:  [name], [name], DO  CC: Back and neck pain follow-up  [id number]  Stephen [name]. is a 62 y.o. male coming in with complaint of back and neck pain. OMT 10/07/2022. Patient states same per usual. Doing well. No new concerns.  Medications patient has been prescribed: None  Taking:         Reviewed prior external information including notes and imaging from previsou exam, outside providers and external EMR if available.   As well as notes that were available from care everywhere and other healthcare systems.  Past medical history, social, surgical and family history all reviewed in electronic medical record.  No pertanent information unless stated regarding to the chief complaint.   Past Medical History:  Diagnosis Date   Allergy    Hayfever   Anxiety    Colon polyps    Detached retina, right 03/22/1990   Dr. Jenkins Rouge at [facility] did orbital band procedure with good results.    High cholesterol    Hypertension    Poison ivy dermatitis 07/29/2015    Allergies  Allergen Reactions   Turmeric Rash     Review of Systems:  No headache, visual changes, nausea, vomiting, diarrhea, constipation, dizziness, abdominal pain, skin rash, fevers, chills, night sweats, weight loss, swollen lymph nodes, body aches, joint swelling, chest pain, shortness of breath, mood changes. POSITIVE muscle aches  Objective  Blood pressure 128/86, pulse 81, height 6\' 3"  (1.905 m), weight 208 lb (94.3 kg), SpO2 98%.   General: No apparent distress alert and oriented x3 mood and affect normal, dressed appropriately.  HEENT: Pupils equal, extraocular movements intact  Respiratory: Patient's speak in full sentences and does not appear short of breath  Cardiovascular: No lower  extremity edema, non tender, no erythema  MSK:  Back does have some loss lordosis noted.  Some tenderness to palpitation in the paraspinal musculature.  Tightness with Pearlean Brownie right greater than left.  Osteopathic findings  C3 flexed rotated and side bent right C7 flexed rotated and side bent right T2 extended rotated and side bent left with elevated first rib. T3 extended rotated and side bent right inhaled rib T8 extended rotated and side bent left L2 flexed rotated and side bent right Sacrum right on right     Assessment and Plan:  Cervical radiculopathy at C8 Likely no radicular symptoms and the patient was having increasing tightness noted on the right side today.  Was doing more overhead activity that I think was contributing.  Discussed icing regimen and home exercises otherwise.  Discussed which activities to do and wants to avoid.  Will follow-up again in 6 to 8 weeks    Nonallopathic problems  Decision today to treat with OMT was based on Physical Exam  After verbal consent patient was treated with HVLA, ME, FPR techniques in cervical, rib, thoracic, lumbar, and sacral  areas  Patient tolerated the procedure well with improvement in symptoms  Patient given exercises, stretches and lifestyle modifications  See medications in patient instructions if given  Patient will follow up in 4-8 weeks    The above documentation has been reviewed and is accurate and complete Judi Saa, DO          Note: This dictation was prepared with Dragon dictation  along with smaller phrase technology. Any transcriptional errors that result from this process are unintentional.

## 2022-12-09 ENCOUNTER — Ambulatory Visit: Payer: 59 | Admitting: Family Medicine

## 2022-12-09 ENCOUNTER — Encounter: Payer: Self-pay | Admitting: Family Medicine

## 2022-12-09 VITALS — BP 128/86 | HR 81 | Ht 75.0 in | Wt 208.0 lb

## 2022-12-09 DIAGNOSIS — M9902 Segmental and somatic dysfunction of thoracic region: Secondary | ICD-10-CM | POA: Diagnosis not present

## 2022-12-09 DIAGNOSIS — M9904 Segmental and somatic dysfunction of sacral region: Secondary | ICD-10-CM | POA: Diagnosis not present

## 2022-12-09 DIAGNOSIS — M9903 Segmental and somatic dysfunction of lumbar region: Secondary | ICD-10-CM | POA: Diagnosis not present

## 2022-12-09 DIAGNOSIS — M5412 Radiculopathy, cervical region: Secondary | ICD-10-CM

## 2022-12-09 DIAGNOSIS — M9901 Segmental and somatic dysfunction of cervical region: Secondary | ICD-10-CM | POA: Diagnosis not present

## 2022-12-09 DIAGNOSIS — M9908 Segmental and somatic dysfunction of rib cage: Secondary | ICD-10-CM | POA: Diagnosis not present

## 2022-12-09 NOTE — Assessment & Plan Note (Signed)
Likely no radicular symptoms and the patient was having increasing tightness noted on the right side today.  Was doing more overhead activity that I think was contributing.  Discussed icing regimen and home exercises otherwise.  Discussed which activities to do and wants to avoid.  Will follow-up again in 6 to 8 weeks

## 2022-12-09 NOTE — Patient Instructions (Signed)
Careful with security installations Enjoy the fall See you again in 5-6 weeks

## 2022-12-10 ENCOUNTER — Encounter: Payer: Self-pay | Admitting: Family Medicine

## 2023-01-20 NOTE — Progress Notes (Signed)
  Stephen [name] [address] [place] [address] Phone: [phone] Subjective:   Stephen [name], am serving as a scribe for Dr. Antoine Primas.  I'm seeing this patient by the request  of:  [name], [name], DO  CC: Headache and neck pain follow-up  [name]  Stephen [name]. is a 62 y.o. male coming in with complaint of back and neck pain. OMT 12/09/2022. Patient states same per usual. No new concerns.  Has been doing very well.  No significant difficulty with his headaches at the moment.  Feels like he is in a stable position.  Medications patient has been prescribed: None  Taking:         Past Medical History:  Diagnosis Date   Allergy    Hayfever   Anxiety    Colon polyps    Detached retina, right 03/22/1990   Dr. Jenkins Rouge at [facility] did orbital band procedure with good results.    High cholesterol    Hypertension    Poison ivy dermatitis 07/29/2015    Allergies  Allergen Reactions   Turmeric Rash    Objective  Blood pressure 126/82, pulse 84, height 6\' 3"  (1.905 m), weight 209 lb (94.8 kg), SpO2 98%.   General: No apparent distress alert and oriented x3 mood and affect normal, dressed appropriately.  HEENT: Pupils equal, extraocular movements intact  Respiratory: Patient'[name] speak in full sentences and does not appear short of breath  Cardiovascular: No lower extremity edema, non tender, no erythema    Osteopathic findings  C3 flexed rotated and side bent right C7 flexed rotated and side bent left T3 extended rotated and side bent right inhaled rib T6 extended rotated and side bent left L2 flexed rotated and side bent right Sacrum right on right       Assessment and Plan:  Cervical radiculopathy at C8 Stable at this moment.  Discussed with patient to continue the Effexor.  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  Follow-up with me again in 6 to 8 weeks otherwise.    Nonallopathic problems  Decision  today to treat with OMT was based on Physical Exam  After verbal consent patient was treated with HVLA, ME, FPR techniques in cervical, rib, thoracic, lumbar, and sacral  areas  Patient tolerated the procedure well with improvement in symptoms  Patient given exercises, stretches and lifestyle modifications  See medications in patient instructions if given  Patient will follow up in 4-8 weeks     The above documentation has been reviewed and is accurate and complete Judi Saa, DO         Note: This dictation was prepared with Dragon dictation along with smaller phrase technology. Any transcriptional errors that result from this process are unintentional.

## 2023-01-21 ENCOUNTER — Ambulatory Visit: Payer: 59 | Admitting: Family Medicine

## 2023-01-25 ENCOUNTER — Ambulatory Visit: Payer: 59 | Admitting: Family Medicine

## 2023-01-25 ENCOUNTER — Encounter: Payer: Self-pay | Admitting: Family Medicine

## 2023-01-25 VITALS — BP 126/82 | HR 84 | Ht 75.0 in | Wt 209.0 lb

## 2023-01-25 DIAGNOSIS — M9902 Segmental and somatic dysfunction of thoracic region: Secondary | ICD-10-CM

## 2023-01-25 DIAGNOSIS — M9904 Segmental and somatic dysfunction of sacral region: Secondary | ICD-10-CM | POA: Diagnosis not present

## 2023-01-25 DIAGNOSIS — M9901 Segmental and somatic dysfunction of cervical region: Secondary | ICD-10-CM | POA: Diagnosis not present

## 2023-01-25 DIAGNOSIS — M5412 Radiculopathy, cervical region: Secondary | ICD-10-CM

## 2023-01-25 DIAGNOSIS — M9903 Segmental and somatic dysfunction of lumbar region: Secondary | ICD-10-CM | POA: Diagnosis not present

## 2023-01-25 DIAGNOSIS — M9908 Segmental and somatic dysfunction of rib cage: Secondary | ICD-10-CM | POA: Diagnosis not present

## 2023-01-25 NOTE — Patient Instructions (Signed)
Good to see you! NO changes See you again in 6-8 weeks

## 2023-01-25 NOTE — Assessment & Plan Note (Signed)
Stable at this moment.  Discussed with patient to continue the Effexor.  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  Follow-up with me again in 6 to 8 weeks otherwise.

## 2023-02-25 ENCOUNTER — Ambulatory Visit (INDEPENDENT_AMBULATORY_CARE_PROVIDER_SITE_OTHER): Payer: 59 | Admitting: Family Medicine

## 2023-02-25 VITALS — Temp 97.6°F

## 2023-02-25 DIAGNOSIS — Z23 Encounter for immunization: Secondary | ICD-10-CM

## 2023-02-25 NOTE — Progress Notes (Signed)
Pt here to have 2nd shingrix vaccine done.

## 2023-02-28 ENCOUNTER — Encounter: Payer: Self-pay | Admitting: Family Medicine

## 2023-03-18 NOTE — Progress Notes (Unsigned)
[name] [address] [place] [address] Phone: [phone] Subjective:   Bruce Donath, am serving as a scribe for Dr. Antoine Primas.  I'm seeing this patient by the request  of:  [name], [name], DO  CC: back and neck pain follow up   [id number]  Stephen [name]. is a 62 y.o. male coming in with complaint of back and neck pain. OMT 01/25/2023. Patient states that he has been doing the same as last visit.  Patient has not had any worsening radicular symptoms.  Continues to have some neck pain but nothing that stopping them from any type of activity.  Medications patient has been prescribed: None  Taking:         Reviewed prior external information including notes and imaging from previsou exam, outside providers and external EMR if available.   As well as notes that were available from care everywhere and other healthcare systems.  Past medical history, social, surgical and family history all reviewed in electronic medical record.  No pertanent information unless stated regarding to the chief complaint.   Past Medical History:  Diagnosis Date   Allergy    Hayfever   Anxiety    Colon polyps    Detached retina, right 03/22/1990   Dr. Jenkins Rouge at [facility] did orbital band procedure with good results.    High cholesterol    Hypertension    Poison ivy dermatitis 07/29/2015    Allergies  Allergen Reactions   Turmeric Rash     Review of Systems:  No headache, visual changes, nausea, vomiting, diarrhea, constipation, dizziness, abdominal pain, skin rash, fevers, chills, night sweats, weight loss, swollen lymph nodes, body aches, joint swelling, chest pain, shortness of breath, mood changes. POSITIVE muscle aches  Objective  Blood pressure 132/78, pulse 73, height 6\' 3"  (1.905 m), weight 210 lb (95.3 kg), SpO2 98%.   General: No apparent distress alert and oriented x3 mood and affect normal, dressed appropriately.  HEENT: Pupils  equal, extraocular movements intact  Respiratory: Patient's speak in full sentences and does not appear short of breath  Cardiovascular: No lower extremity edema, non tender, no erythema  MSK:  Back does have some loss lordosis noted.  Some tenderness to palpation in the paraspinal musculature.  Neck exam does have some limited sidebending bilaterally.  Patient  Osteopathic findings  C2 flexed rotated and side bent right C3 flexed rotated and side bent left C7 flexed rotated and side bent left T3 extended rotated and side bent right inhaled rib T9 extended rotated and side bent left L2 flexed rotated and side bent right Sacrum right on right     Assessment and Plan:  Cervical radiculopathy at C8 Stable overall but does need to continue to have continued to do well.  Discussed icing regimen and massages otherwise continue to work on posture and ergonomics throughout the day.  Follow-up again in 6-8 weeks    Nonallopathic problems  Decision today to treat with OMT was based on Physical Exam  After verbal consent patient was treated with HVLA, ME, FPR techniques in cervical, rib, thoracic, lumbar, and sacral  areas  Patient tolerated the procedure well with improvement in symptoms  Patient given exercises, stretches and lifestyle modifications  See medications in patient instructions if given  Patient will follow up in 4-8 weeks     The above documentation has been reviewed and is accurate and complete Judi Saa, DO  Note: This dictation was prepared with Dragon dictation along with smaller phrase technology. Any transcriptional errors that result from this process are unintentional.

## 2023-03-21 ENCOUNTER — Ambulatory Visit: Payer: 59 | Admitting: Family Medicine

## 2023-03-21 ENCOUNTER — Encounter: Payer: Self-pay | Admitting: Family Medicine

## 2023-03-21 VITALS — BP 132/78 | HR 73 | Ht 75.0 in | Wt 210.0 lb

## 2023-03-21 DIAGNOSIS — M9904 Segmental and somatic dysfunction of sacral region: Secondary | ICD-10-CM

## 2023-03-21 DIAGNOSIS — M9902 Segmental and somatic dysfunction of thoracic region: Secondary | ICD-10-CM

## 2023-03-21 DIAGNOSIS — M9903 Segmental and somatic dysfunction of lumbar region: Secondary | ICD-10-CM

## 2023-03-21 DIAGNOSIS — M9901 Segmental and somatic dysfunction of cervical region: Secondary | ICD-10-CM

## 2023-03-21 DIAGNOSIS — M9908 Segmental and somatic dysfunction of rib cage: Secondary | ICD-10-CM | POA: Diagnosis not present

## 2023-03-21 DIAGNOSIS — M5412 Radiculopathy, cervical region: Secondary | ICD-10-CM

## 2023-03-21 NOTE — Patient Instructions (Signed)
Great to see you Tighter today See me in 2 months

## 2023-03-21 NOTE — Assessment & Plan Note (Signed)
Stable overall but does need to continue to have continued to do well.  Discussed icing regimen and massages otherwise continue to work on posture and ergonomics throughout the day.  Follow-up again in 6-8 weeks

## 2023-03-22 ENCOUNTER — Encounter: Payer: Self-pay | Admitting: Medical-Surgical

## 2023-03-22 ENCOUNTER — Ambulatory Visit (INDEPENDENT_AMBULATORY_CARE_PROVIDER_SITE_OTHER): Payer: 59 | Admitting: Medical-Surgical

## 2023-03-22 ENCOUNTER — Other Ambulatory Visit (HOSPITAL_BASED_OUTPATIENT_CLINIC_OR_DEPARTMENT_OTHER): Payer: Self-pay

## 2023-03-22 VITALS — BP 157/88 | HR 66 | Resp 20 | Ht 75.0 in | Wt 209.6 lb

## 2023-03-22 DIAGNOSIS — B9689 Other specified bacterial agents as the cause of diseases classified elsewhere: Secondary | ICD-10-CM | POA: Diagnosis not present

## 2023-03-22 DIAGNOSIS — J019 Acute sinusitis, unspecified: Secondary | ICD-10-CM | POA: Diagnosis not present

## 2023-03-22 LAB — POC COVID19 BINAXNOW: SARS Coronavirus 2 Ag: NEGATIVE

## 2023-03-22 LAB — POCT RAPID STREP A (OFFICE): Rapid Strep A Screen: NEGATIVE

## 2023-03-22 LAB — POCT INFLUENZA A/B
Influenza A, POC: NEGATIVE
Influenza B, POC: NEGATIVE

## 2023-03-22 MED ORDER — AMOXICILLIN-POT CLAVULANATE 875-125 MG PO TABS
1.0000 | ORAL_TABLET | Freq: Two times a day (BID) | ORAL | 0 refills | Status: DC
  Filled 2023-03-22: qty 20, 10d supply, fill #0

## 2023-03-22 MED ORDER — GUAIFENESIN-CODEINE 100-10 MG/5ML PO SOLN
5.0000 mL | Freq: Three times a day (TID) | ORAL | 0 refills | Status: DC | PRN
  Filled 2023-03-22: qty 120, 8d supply, fill #0

## 2023-03-22 NOTE — Progress Notes (Signed)
        Established patient visit  History, exam, impression, and plan:  1. Acute bacterial sinusitis (Primary) Pleasant 63 year old male presenting today with reports of 1 week of upper respiratory symptoms including sinus congestion, nasal discharge, sore throat, and cough.  Having difficulty sleeping at night due to coughing.  Was exposed to his daughter who was also sick for about 2 weeks.  Has been taking Tylenol , an allergy pill, and over-the-counter cold medications with minimal relief.  Denies fever, chills, shortness of breath, chest pain, and GI symptoms.  On exam, TMs are partially obstructed by cerumen but otherwise normal.  Nasal congestion noted.  Posterior oropharyngeal erythema without exudate, + cobblestoning.  Lungs CTA with even and unlabored respirations.  HRRR, S1/S2 normal.  No maxillary or frontal sinus tenderness.  No cervical lymphadenopathy.  With duration of symptoms, suspect secondary bacterial infection.  Patient does note a mild improvement over the week with abrupt resickening.  POCT flu, COVID, and strep all negative.  Treating with Augmentin  twice daily x 10 days.  Adding Robitussin AC for nighttime cough control.  Okay to use over-the-counter cough and cold medications but would prefer he aim for those that are made for patients with high blood pressure such as Coricidin or HBP DayQuil/NyQuil.   Procedures performed this visit: None.  Return if symptoms worsen or fail to improve.  __________________________________ Zada FREDRIK Palin, DNP, APRN, FNP-BC Primary Care and Sports Medicine [facility]

## 2023-03-31 ENCOUNTER — Other Ambulatory Visit (HOSPITAL_BASED_OUTPATIENT_CLINIC_OR_DEPARTMENT_OTHER): Payer: Self-pay

## 2023-05-09 ENCOUNTER — Other Ambulatory Visit: Payer: Self-pay | Admitting: Family Medicine

## 2023-05-09 ENCOUNTER — Encounter: Payer: Self-pay | Admitting: Family Medicine

## 2023-05-09 DIAGNOSIS — G542 Cervical root disorders, not elsewhere classified: Secondary | ICD-10-CM

## 2023-05-10 ENCOUNTER — Other Ambulatory Visit (HOSPITAL_BASED_OUTPATIENT_CLINIC_OR_DEPARTMENT_OTHER): Payer: Self-pay

## 2023-05-10 ENCOUNTER — Other Ambulatory Visit: Payer: Self-pay

## 2023-05-10 DIAGNOSIS — G542 Cervical root disorders, not elsewhere classified: Secondary | ICD-10-CM

## 2023-05-10 MED ORDER — VENLAFAXINE HCL ER 37.5 MG PO CP24
75.0000 mg | ORAL_CAPSULE | Freq: Every day | ORAL | 1 refills | Status: AC
  Filled 2023-05-10 – 2023-11-21 (×2): qty 180, 90d supply, fill #0
  Filled 2024-02-21: qty 180, 90d supply, fill #1

## 2023-05-10 MED ORDER — VENLAFAXINE HCL ER 37.5 MG PO CP24
75.0000 mg | ORAL_CAPSULE | Freq: Every day | ORAL | 1 refills | Status: DC
  Filled 2023-05-10: qty 180, 90d supply, fill #0
  Filled 2023-08-12: qty 180, 90d supply, fill #1

## 2023-05-16 ENCOUNTER — Other Ambulatory Visit (HOSPITAL_BASED_OUTPATIENT_CLINIC_OR_DEPARTMENT_OTHER): Payer: Self-pay

## 2023-05-18 NOTE — Progress Notes (Unsigned)
 [name] [address] [place] [address] Phone: [phone] Subjective:   Stephen [name], am serving as a scribe for Dr. Antoine [name].  I'm seeing this patient by the request  of:  [name], Stephen S, DO  CC: back and neck pain   [id number]  Stephen [name]. is a 63 y.o. male coming in with complaint of back and neck pain. OMT 03/20/2024. Patient states overall doing really well.  Very minimal discomfort.  At last exam it seemed to be tighter but today the patient feels like he is doing much better.  Medications patient has been prescribed: Effexor  Taking: Yes         Reviewed prior external information including notes and imaging from previsou exam, outside providers and external EMR if available.   As well as notes that were available from care everywhere and other healthcare systems.  Past medical history, social, surgical and family history all reviewed in electronic medical record.  No pertanent information unless stated regarding to the chief complaint.   Past Medical History:  Diagnosis Date   Allergy    Hayfever   Anxiety    Colon polyps    Detached retina, right 03/22/1990   Dr. Jenkins [name] at [facility] did orbital band procedure with good results.    High cholesterol    Hypertension    Poison ivy dermatitis 07/29/2015    Allergies  Allergen Reactions   Turmeric Rash     Review of Systems:  No headache, visual changes, nausea, vomiting, diarrhea, constipation, dizziness, abdominal pain, skin rash, fevers, chills, night sweats, weight loss, swollen lymph nodes, body aches, joint swelling, chest pain, shortness of breath, mood changes. POSITIVE muscle aches  Objective  Blood pressure 122/68, pulse 77, height 6\' 3"  (1.905 m), weight 209 lb (94.8 kg), SpO2 99%.   General: No apparent distress alert and oriented x3 mood and affect normal, dressed appropriately.  HEENT: Pupils equal, extraocular movements intact   Respiratory: Patient's speak in full sentences and does not appear short of breath  Cardiovascular: No lower extremity edema, non tender, no erythema  Gait MSK:  Back does have some loss lordosis noted but very minimal.  Patient's neck exam also better than patient's usual even baseline.  Osteopathic findings C3 flexed rotated and side bent right T3 extended rotated and side bent right inhaled rib L1 flexed rotated and side bent right L3 flexed rotated and side bent left Sacrum right on right       Assessment and Plan:  Cervical radiculopathy at C8 Patient is remarkably doing extremely well today.  Very minimal tightness noted.  Encourage patient to continue to do what ever he is doing at the moment.  Will increase duration between intervals to 8 to 10 weeks for further evaluation and treatment.  No change in medications.    Nonallopathic problems  Decision today to treat with OMT was based on Physical Exam  After verbal consent patient was treated with HVLA, ME, FPR techniques in cervical, rib, thoracic, lumbar, and sacral  areas  Patient tolerated the procedure well with improvement in symptoms  Patient given exercises, stretches and lifestyle modifications  See medications in patient instructions if given  Patient will follow up in 4-8 weeks    The above documentation has been reviewed and is accurate and complete Judi Saa, DO          Note: This dictation was prepared with Dragon dictation along with smaller phrase technology.  Any transcriptional errors that result from this process are unintentional.

## 2023-05-19 ENCOUNTER — Ambulatory Visit (INDEPENDENT_AMBULATORY_CARE_PROVIDER_SITE_OTHER): Payer: 59 | Admitting: Family Medicine

## 2023-05-19 ENCOUNTER — Encounter: Payer: Self-pay | Admitting: Family Medicine

## 2023-05-19 VITALS — BP 122/68 | HR 77 | Ht 75.0 in | Wt 209.0 lb

## 2023-05-19 DIAGNOSIS — M9908 Segmental and somatic dysfunction of rib cage: Secondary | ICD-10-CM

## 2023-05-19 DIAGNOSIS — M9902 Segmental and somatic dysfunction of thoracic region: Secondary | ICD-10-CM | POA: Diagnosis not present

## 2023-05-19 DIAGNOSIS — M9903 Segmental and somatic dysfunction of lumbar region: Secondary | ICD-10-CM

## 2023-05-19 DIAGNOSIS — M9901 Segmental and somatic dysfunction of cervical region: Secondary | ICD-10-CM

## 2023-05-19 DIAGNOSIS — M5412 Radiculopathy, cervical region: Secondary | ICD-10-CM

## 2023-05-19 DIAGNOSIS — M9904 Segmental and somatic dysfunction of sacral region: Secondary | ICD-10-CM

## 2023-05-19 NOTE — Assessment & Plan Note (Signed)
 Patient is remarkably doing extremely well today.  Very minimal tightness noted.  Encourage patient to continue to do what ever he is doing at the moment.  Will increase duration between intervals to 8 to 10 weeks for further evaluation and treatment.  No change in medications.

## 2023-05-19 NOTE — Patient Instructions (Signed)
 Great to see you  See me in 8-10 weeks

## 2023-05-25 ENCOUNTER — Other Ambulatory Visit (HOSPITAL_BASED_OUTPATIENT_CLINIC_OR_DEPARTMENT_OTHER): Payer: Self-pay

## 2023-06-27 DIAGNOSIS — H2513 Age-related nuclear cataract, bilateral: Secondary | ICD-10-CM | POA: Diagnosis not present

## 2023-07-19 NOTE — Progress Notes (Unsigned)
  [facility] [address] [place] [address] Phone: [phone] Subjective:   Stephen [name], am serving as a scribe for Dr. Ronnell Coins.  I'm seeing this patient by the request  of:  [name], [name], DO  CC: Neck pain follow-up  [id number]  [name]. is a 63 y.o. male coming in with complaint of back and neck pain. OMT 05/19/2023. Patient states that he has been doing well. Here for maintenance appointment.   Medications patient has been prescribed: Effexor   Taking:         Reviewed prior external information including notes and imaging from previsou exam, outside providers and external EMR if available.   As well as notes that were available from care everywhere and other healthcare systems.  Past medical history, social, surgical and family history all reviewed in electronic medical record.  No pertanent information unless stated regarding to the chief complaint.   Past Medical History:  Diagnosis Date   Allergy    Hayfever   Anxiety    Colon polyps    Detached retina, right 03/22/1990   Dr. Randye Buttner at [facility] did orbital band procedure with good results.    High cholesterol    Hypertension    Poison ivy dermatitis 07/29/2015    Allergies  Allergen Reactions   Turmeric Rash     Review of Systems:  No headache, visual changes, nausea, vomiting, diarrhea, constipation, dizziness, abdominal pain, skin rash, fevers, chills, night sweats, weight loss, swollen lymph nodes, body aches, joint swelling, chest pain, shortness of breath, mood changes. POSITIVE muscle aches  Objective  Blood pressure 118/80, pulse 82, height 6\' 3"  (1.905 m), weight 206 lb (93.4 kg), SpO2 97%.   General: No apparent distress alert and oriented x3 mood and affect normal, dressed appropriately.  HEENT: Pupils equal, extraocular movements intact  Respiratory: Patient's speak in full sentences and does not appear short of breath  Cardiovascular:  No lower extremity edema, non tender, no erythema  Gait MSK:  Neck exam shows patient still has some limited sidebending bilaterally.  Some tightness noted with FABER test right greater than left.  Osteopathic findings  C2 flexed rotated and side bent right C5 flexed rotated and side bent left T3 extended rotated and side bent right inhaled rib T5 extended rotated and side bent left L2 flexed rotated and side bent right L5 flexed rotated and side bent left Sacrum right on right     Assessment and Plan:  Cervical radiculopathy at C8 Discussed icing regimen and home exercises, which activities to do and which ones to avoid.  Increase activity slowly.  Follow-up again in 6 to 8 weeks    Nonallopathic problems  Decision today to treat with OMT was based on Physical Exam  After verbal consent patient was treated with HVLA, ME, FPR techniques in cervical, rib, thoracic, lumbar, and sacral  areas  Patient tolerated the procedure well with improvement in symptoms  Patient given exercises, stretches and lifestyle modifications  See medications in patient instructions if given  Patient will follow up in 4-8 weeks    The above documentation has been reviewed and is accurate and complete Dianah Pruett M Mireya Meditz, DO          Note: This dictation was prepared with Dragon dictation along with smaller phrase technology. Any transcriptional errors that result from this process are unintentional.

## 2023-07-20 ENCOUNTER — Encounter: Payer: Self-pay | Admitting: Family Medicine

## 2023-07-20 ENCOUNTER — Ambulatory Visit (INDEPENDENT_AMBULATORY_CARE_PROVIDER_SITE_OTHER): Payer: 59 | Admitting: Family Medicine

## 2023-07-20 VITALS — BP 118/80 | HR 82 | Ht 75.0 in | Wt 206.0 lb

## 2023-07-20 DIAGNOSIS — M9901 Segmental and somatic dysfunction of cervical region: Secondary | ICD-10-CM

## 2023-07-20 DIAGNOSIS — M5412 Radiculopathy, cervical region: Secondary | ICD-10-CM

## 2023-07-20 DIAGNOSIS — M9902 Segmental and somatic dysfunction of thoracic region: Secondary | ICD-10-CM

## 2023-07-20 DIAGNOSIS — M9904 Segmental and somatic dysfunction of sacral region: Secondary | ICD-10-CM

## 2023-07-20 DIAGNOSIS — M9908 Segmental and somatic dysfunction of rib cage: Secondary | ICD-10-CM

## 2023-07-20 DIAGNOSIS — M9903 Segmental and somatic dysfunction of lumbar region: Secondary | ICD-10-CM

## 2023-07-20 NOTE — Patient Instructions (Signed)
 Always great to see you Tell your son to continue with negotiations See me in 2 months

## 2023-07-20 NOTE — Assessment & Plan Note (Signed)
 Discussed icing regimen and home exercises, which activities to do and which ones to avoid.  Increase activity slowly.  Follow-up again in 6 to 8 weeks

## 2023-07-28 ENCOUNTER — Encounter: Payer: Self-pay | Admitting: Family Medicine

## 2023-08-13 IMAGING — DX DG WRIST COMPLETE 3+V*R*
4 series · 4 of 4 positions shown · non-contrast
Comparison: None.

CLINICAL DATA: Patient fell from a stool landing on the right
wrist. Pain and deformity.

EXAM:
RIGHT WRIST - COMPLETE 3+ VIEW

[wrist pa]
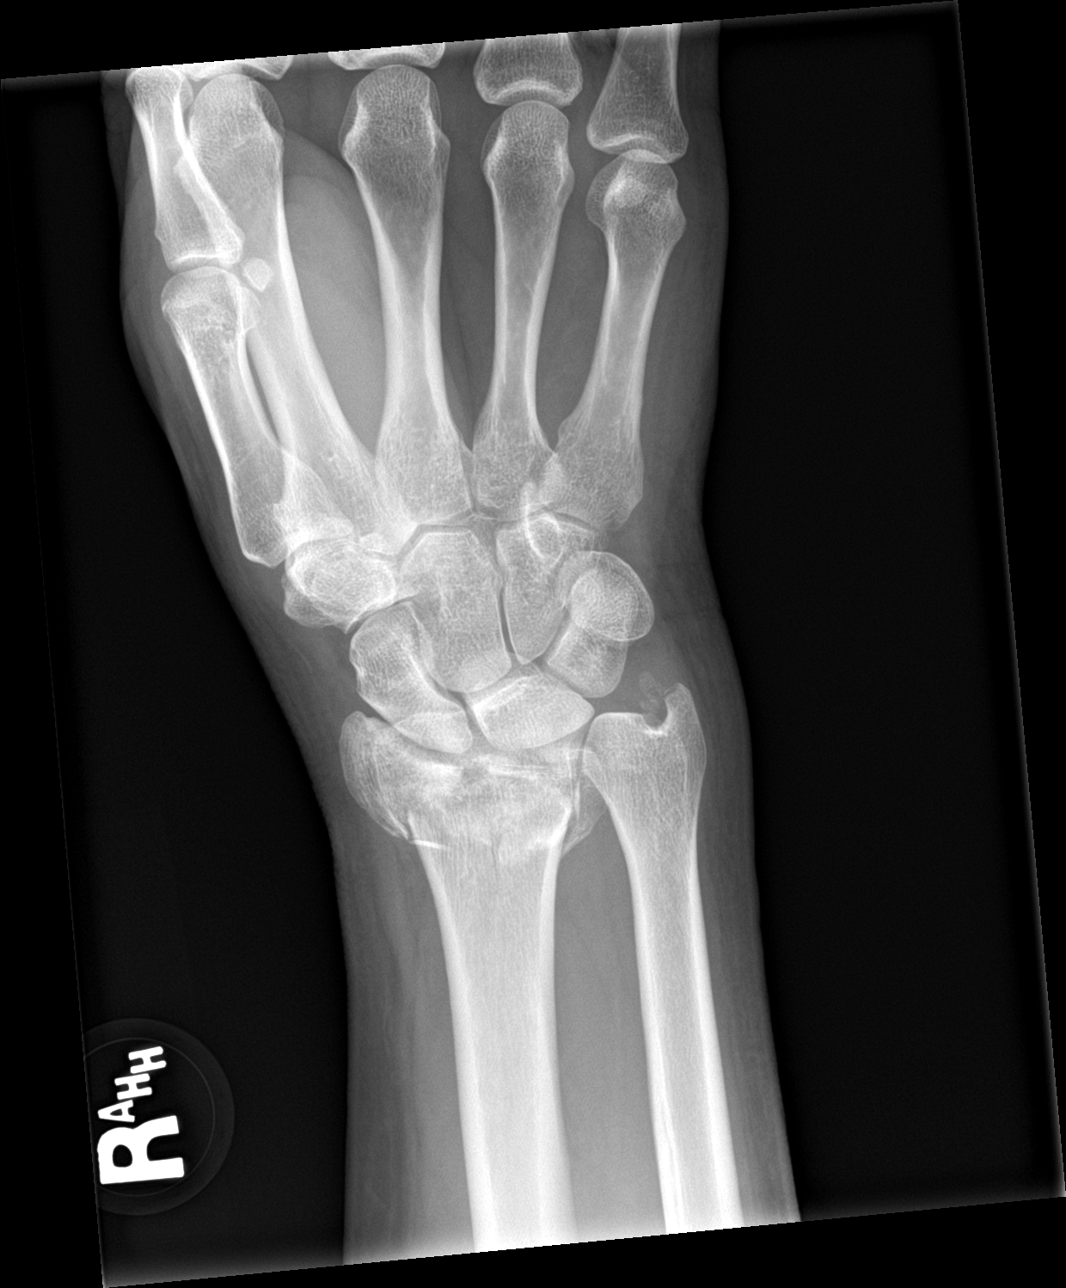

[wrist obl]
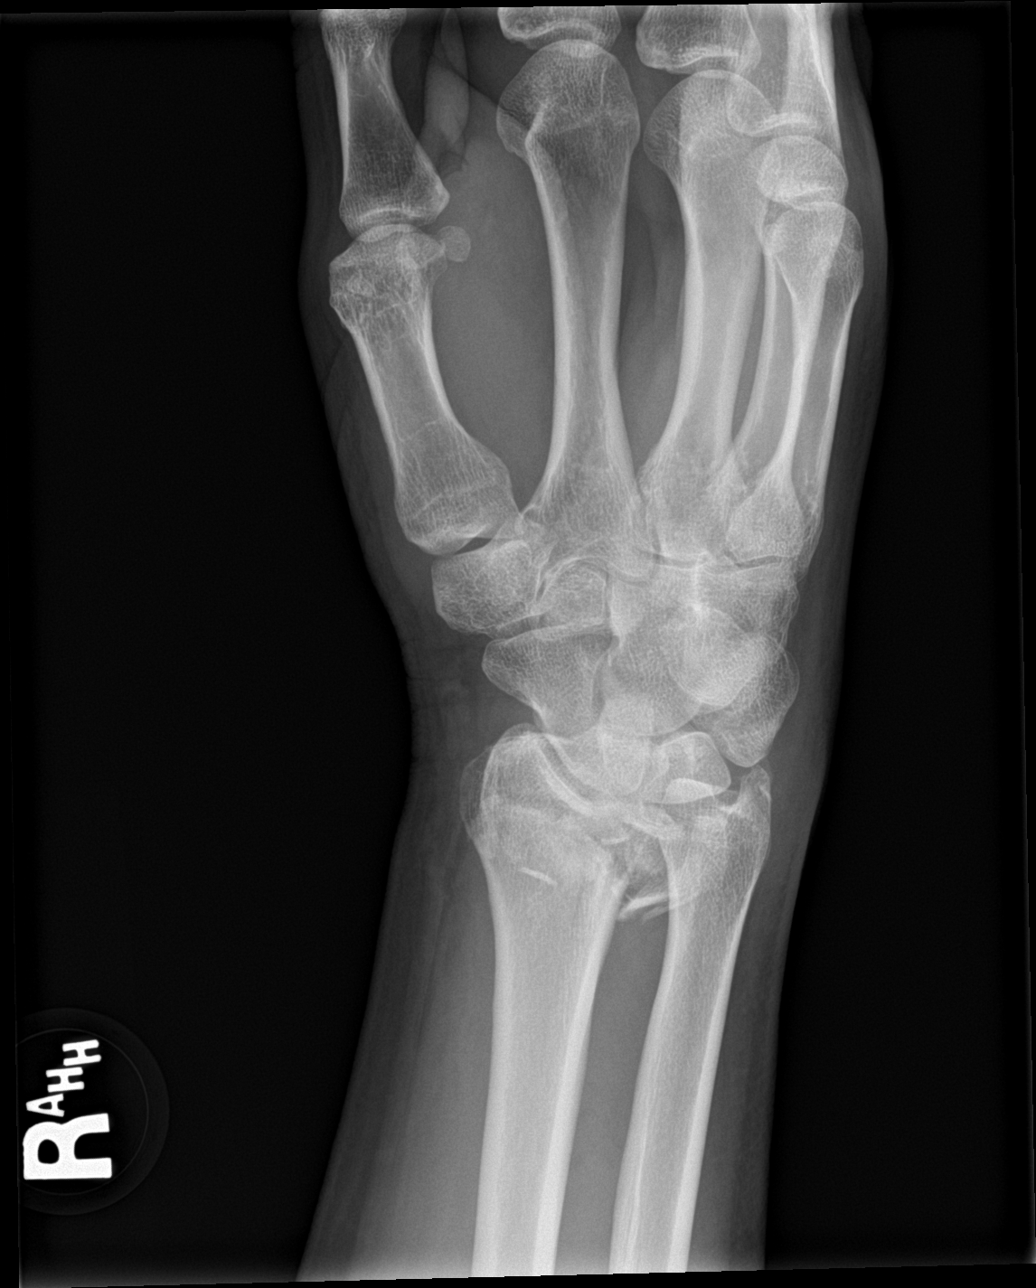

[wrist lat]
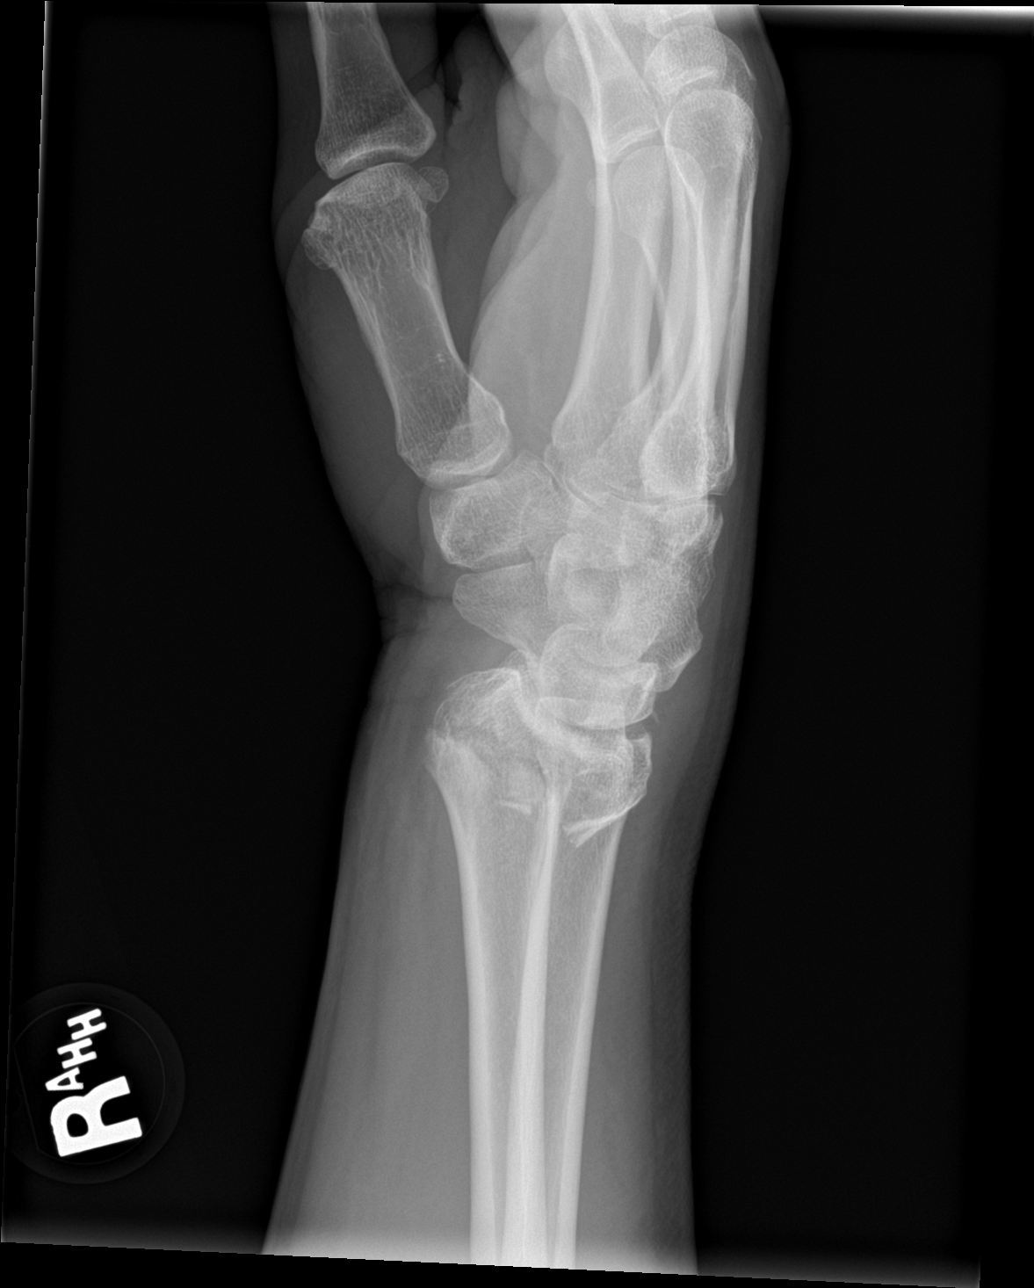

[wrist navicular]
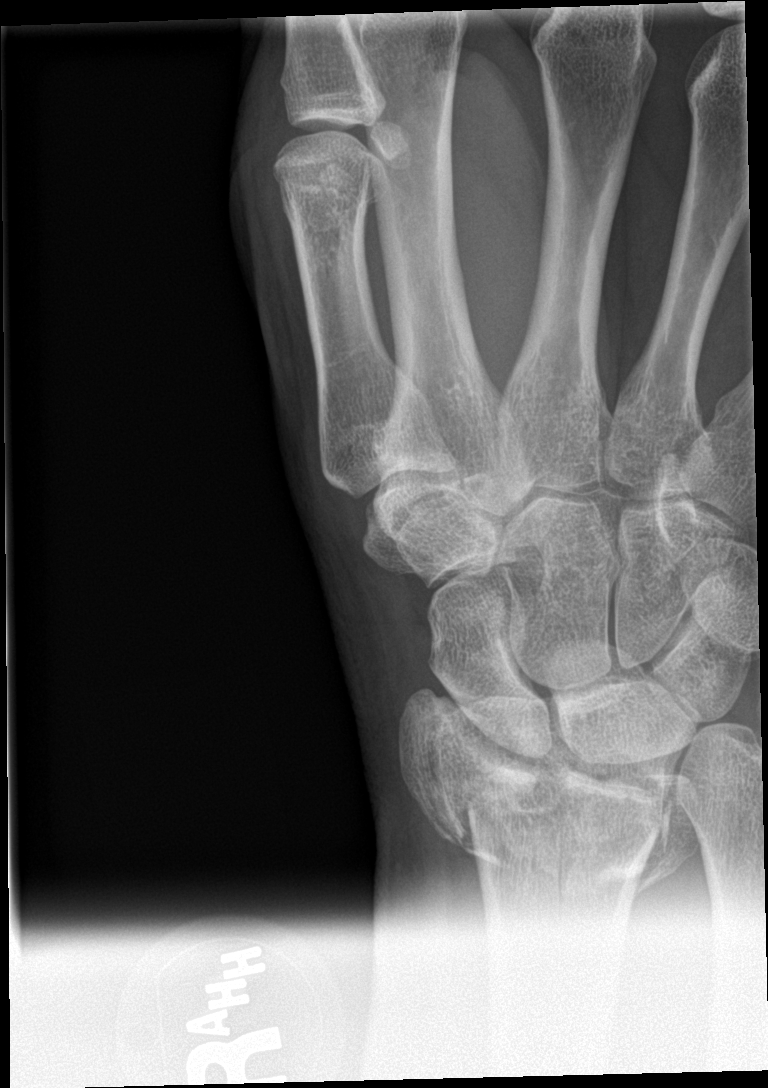

[4 of 4 positions shown; findings below may reference images not displayed]

FINDINGS: There is a comminuted fracture of the distal radius with
intra-articular components. Several posterior fragments are
displaced posteriorly up to 5 mm. There is also dorsal impaction
leading to dorsal angulation of the distal radial articular surface
of 24 degrees.

There is an associated ulnar styloid fracture, from the styloid tip.

Joints are normally aligned.

There is surrounding soft tissue swelling.
IMPRESSION: 1. Comminuted, mildly displaced and dorsally impacted
intra-articular fracture of the distal radius with dorsal angulation
of the articular surface of 24 degrees. Small associated fracture
from the tip of the ulnar styloid. No dislocation.

## 2023-08-17 IMAGING — RF DG WRIST COMPLETE 3+V*R*
1 series · 10 of 10 positions shown · non-contrast
Comparison: Right radius x-ray 04/07/2021

CLINICAL DATA: ORIF distal right radial fracture.

EXAM:
RIGHT WRIST - COMPLETE 3+ VIEW

[Series 1: run · 10 of 10 slices shown]
[im 1/10]
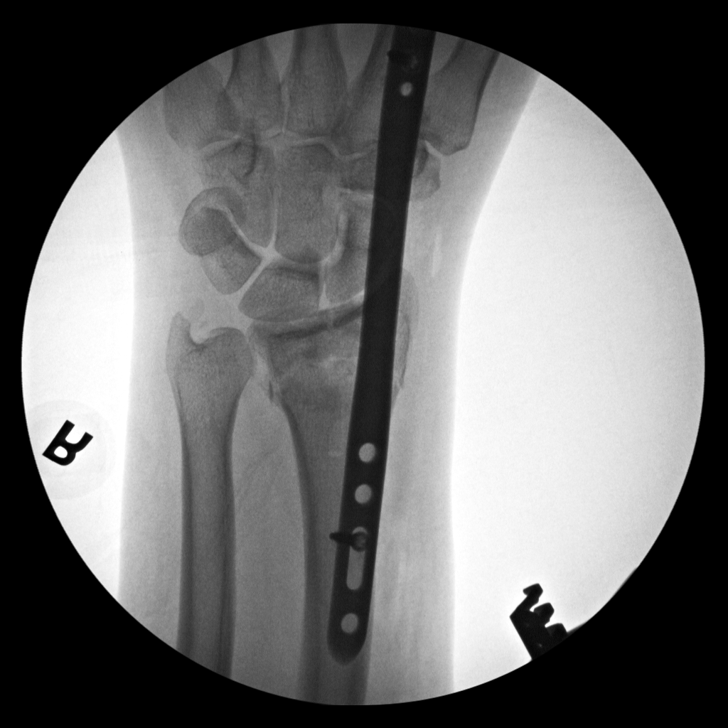
[im 2/10]
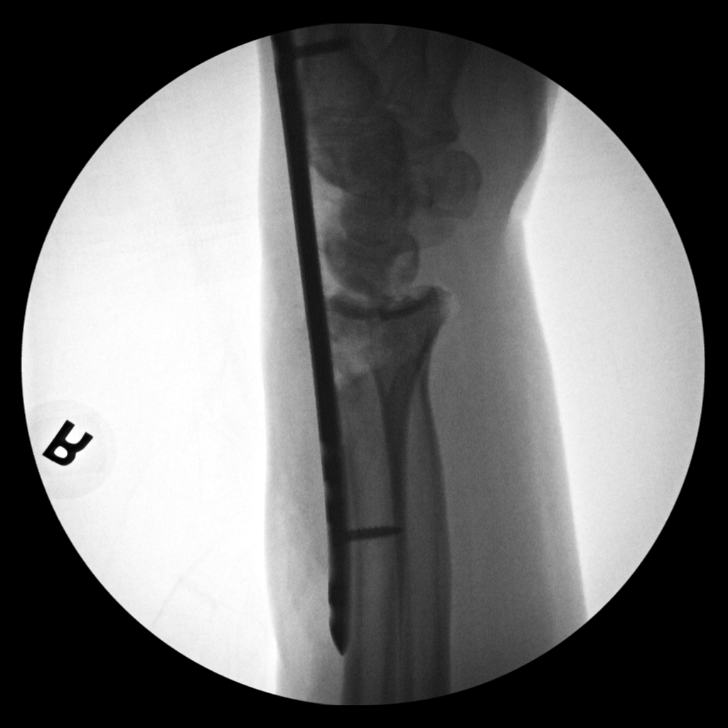
[im 3/10]
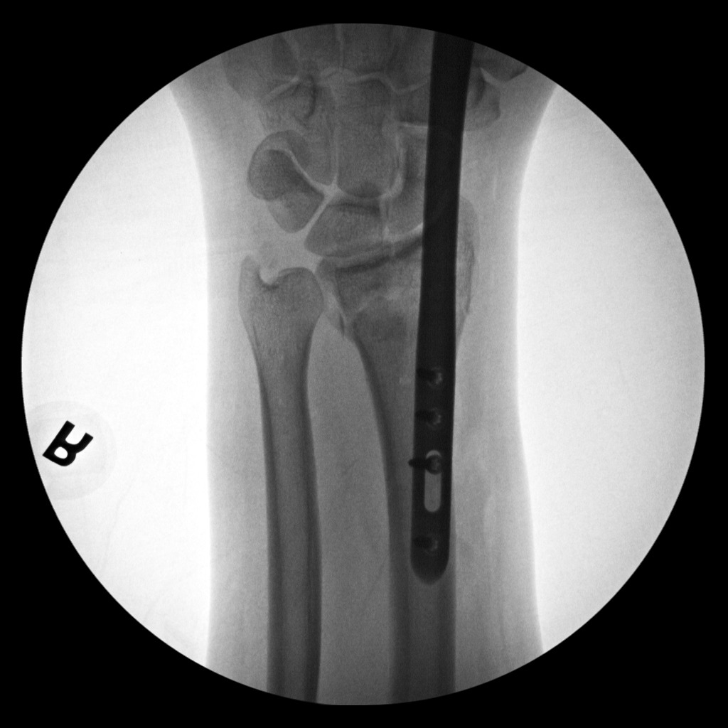
[im 4/10]
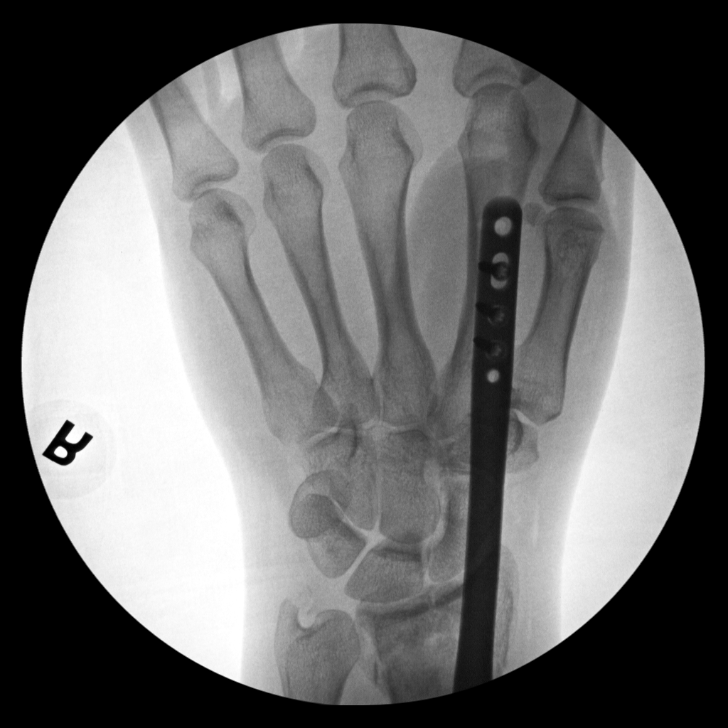
[im 5/10]
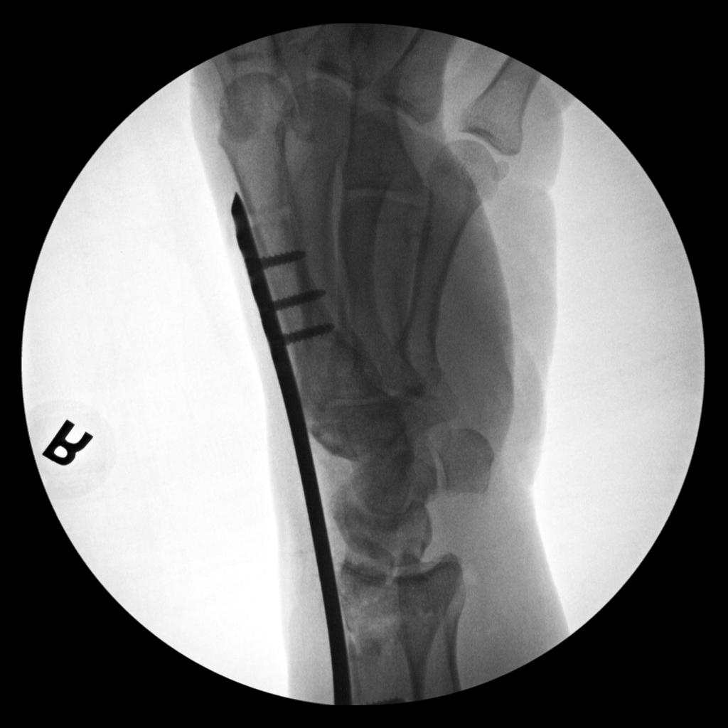
[im 6/10]
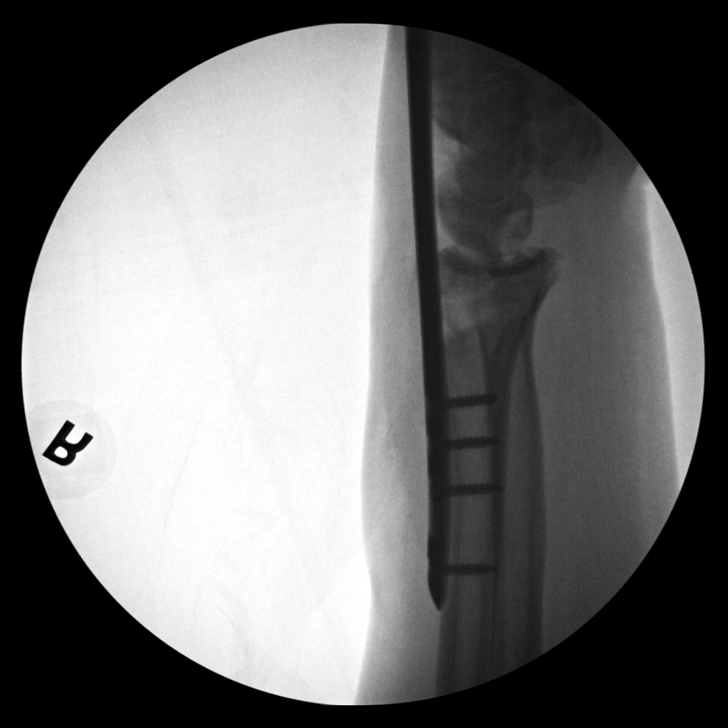
[im 7/10]
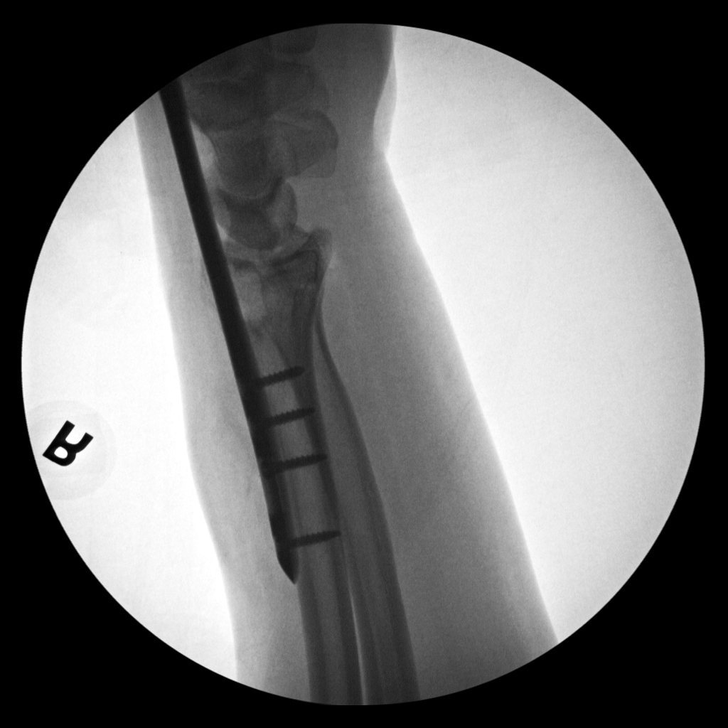
[im 8/10]
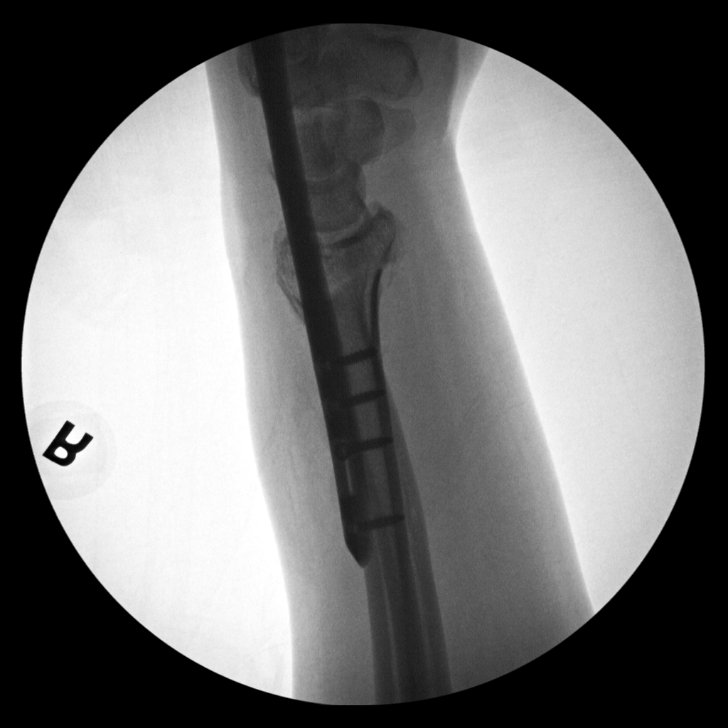
[im 9/10]
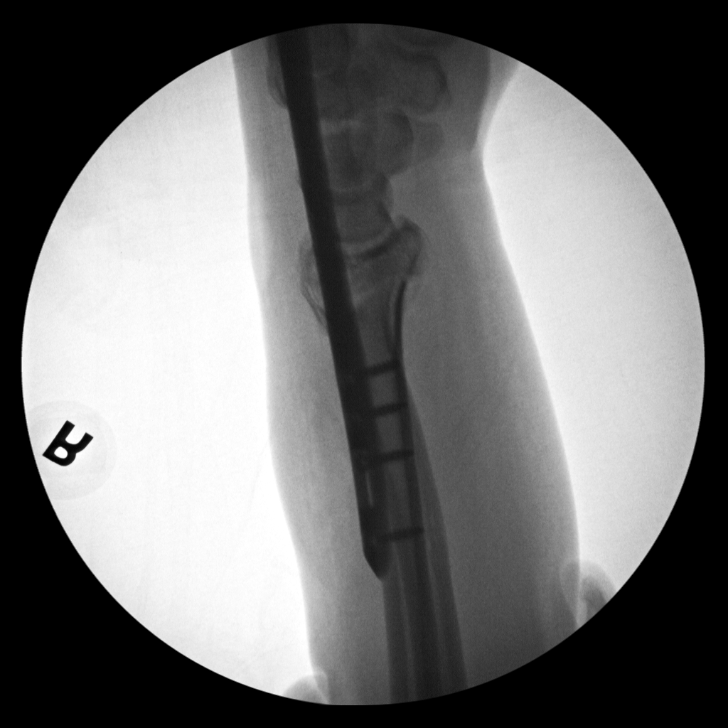
[im 10/10]
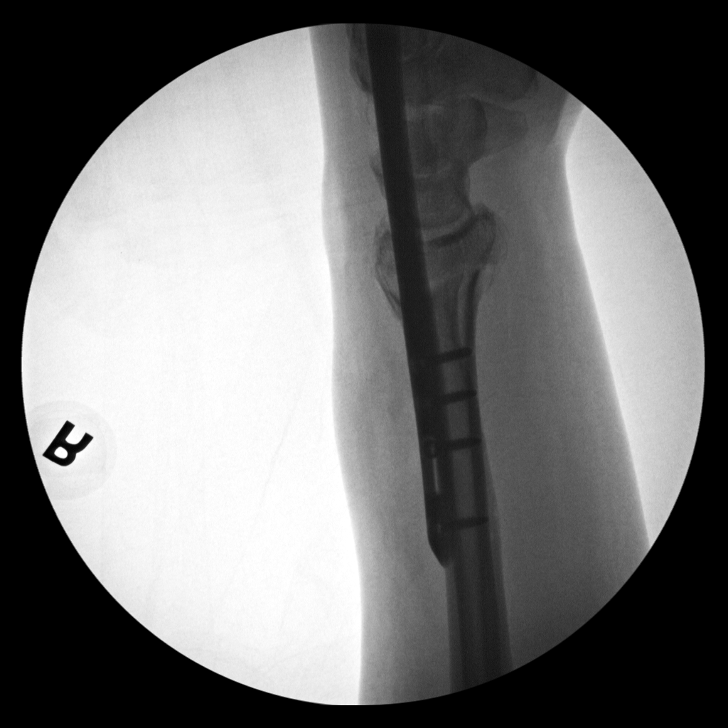

[10 of 10 positions shown; findings below may reference images not displayed]

FINDINGS: Intraoperative right wrist.

10 low resolution intraoperative spot views of the right wrist were
obtained. Distal radius plate and screws are present. Distal radius
fracture appears in anatomic alignment. No dislocation. Ulnar
styloid fracture unchanged.

Total fluoroscopy time: 4 minutes 45 seconds

Total radiation dose: 5.15 micro Gy
IMPRESSION: ORIF distal radius fracture.

## 2023-09-15 NOTE — Progress Notes (Signed)
 [name] [address] [place] [address] Phone: [phone] Subjective:   Stephen [name], am serving as a scribe for Dr. Arthea Claudene. g I'm seeing this patient by the request  of:  [name], [name], DO (Inactive)  CC: Back and neck pain  YEP:Stephen [name]  Stephen [name]. is a 63 y.o. male coming in with complaint of back and neck pain. OMT 07/20/2023. Patient states that he has been doing well.  Some mild tightness with taking care of his grandson for a week.  Medications patient has been prescribed: Effexor   Taking:         Reviewed prior external information including notes and imaging from previsou exam, outside providers and external EMR if available.   As well as notes that were available from care everywhere and other healthcare systems.  Past medical history, social, surgical and family history all reviewed in electronic medical record.  No pertanent information unless stated regarding to the chief complaint.   Past Medical History:  Diagnosis Date   Allergy    Hayfever   Anxiety    Colon polyps    Detached retina, right 03/22/1990   Dr. Marjorie at [facility] did orbital band procedure with good results.    High cholesterol    Hypertension    Poison ivy dermatitis 07/29/2015    Allergies  Allergen Reactions   Turmeric Rash     Review of Systems:  No headache, visual changes, nausea, vomiting, diarrhea, constipation, dizziness, abdominal pain, skin rash, fevers, chills, night sweats, weight loss, swollen lymph nodes, body aches, joint swelling, chest pain, shortness of breath, mood changes. POSITIVE muscle aches  Objective  Blood pressure 132/82, pulse 80, height 6' 3 (1.905 m), weight 206 lb (93.4 kg), SpO2 98%.   General: No apparent distress alert and oriented x3 mood and affect normal, dressed appropriately.  HEENT: Pupils equal, extraocular movements intact  Respiratory: Patient's speak in full sentences and does  not appear short of breath  Cardiovascular: No lower extremity edema, non tender, no erythema  Gait MSK:  Back does have some loss lordosis.  Neck exam does have some limited sidebending bilaterally.  Extension of the neck.  Tightness noted in the right parascapular area.  Osteopathic findings  C2 flexed rotated and side bent right C6 flexed rotated and side bent left T3 extended rotated and side bent right inhaled rib T5 extended rotated and side bent right L1 flexed rotated and side bent right Sacrum right on right       Assessment and Plan:  Cervical radiculopathy at C8 Likely no radicular symptoms at this moment that is consistent.  Discussed icing regimen of home exercises, discussed which activities to do and which ones to avoid.  Increase activity slowly.  Discussed icing regimen.  Follow-up again in 6 to 8 weeks    Nonallopathic problems  Decision today to treat with OMT was based on Physical Exam  After verbal consent patient was treated with HVLA, ME, FPR techniques in cervical, rib, thoracic, lumbar, and sacral  areas  Patient tolerated the procedure well with improvement in symptoms  Patient given exercises, stretches and lifestyle modifications  See medications in patient instructions if given  Patient will follow up in 4-8 weeks    The above documentation has been reviewed and is accurate and complete [name], DO          Note: This dictation was prepared with Dragon dictation along with smaller phrase technology.  Any transcriptional errors that result from this process are unintentional.

## 2023-09-19 ENCOUNTER — Ambulatory Visit (INDEPENDENT_AMBULATORY_CARE_PROVIDER_SITE_OTHER): Admitting: Family Medicine

## 2023-09-19 ENCOUNTER — Encounter: Payer: Self-pay | Admitting: Family Medicine

## 2023-09-19 VITALS — BP 132/82 | HR 80 | Ht 75.0 in | Wt 206.0 lb

## 2023-09-19 DIAGNOSIS — M9908 Segmental and somatic dysfunction of rib cage: Secondary | ICD-10-CM | POA: Diagnosis not present

## 2023-09-19 DIAGNOSIS — M9902 Segmental and somatic dysfunction of thoracic region: Secondary | ICD-10-CM

## 2023-09-19 DIAGNOSIS — M5412 Radiculopathy, cervical region: Secondary | ICD-10-CM | POA: Diagnosis not present

## 2023-09-19 DIAGNOSIS — M9903 Segmental and somatic dysfunction of lumbar region: Secondary | ICD-10-CM | POA: Diagnosis not present

## 2023-09-19 DIAGNOSIS — M9904 Segmental and somatic dysfunction of sacral region: Secondary | ICD-10-CM | POA: Diagnosis not present

## 2023-09-19 DIAGNOSIS — M9901 Segmental and somatic dysfunction of cervical region: Secondary | ICD-10-CM | POA: Diagnosis not present

## 2023-09-19 NOTE — Assessment & Plan Note (Signed)
 Likely no radicular symptoms at this moment that is consistent.  Discussed icing regimen of home exercises, discussed which activities to do and which ones to avoid.  Increase activity slowly.  Discussed icing regimen.  Follow-up again in 6 to 8 weeks

## 2023-09-19 NOTE — Patient Instructions (Signed)
 Stay hydrated See me in 2 months

## 2023-11-17 NOTE — Progress Notes (Unsigned)
 [name] [address] [place] [address] Phone: [phone] Subjective:   Stephen [name], am serving as a scribe for Dr. Arthea Claudene.  I'm seeing this patient by the request  of:  [name], Stephen S, DO (Inactive)  CC: back and neck pain follow up   YEP:Stephen  [name]. is a 63 y.o. male coming in with complaint of back and neck pain. OMT 09/19/2023.Patient states   Medications patient has been prescribed: Effexor   Taking: Yes         Reviewed prior external information including notes and imaging from previsou exam, outside providers and external EMR if available.   As well as notes that were available from care everywhere and other healthcare systems.  Past medical history, social, surgical and family history all reviewed in electronic medical record.  No pertanent information unless stated regarding to the chief complaint.   Past Medical History:  Diagnosis Date   Allergy    Hayfever   Anxiety    Colon polyps    Detached retina, right 03/22/1990   Dr. Marjorie at [facility] did orbital band procedure with good results.    High cholesterol    Hypertension    Poison ivy dermatitis 07/29/2015    Allergies  Allergen Reactions   Turmeric Rash     Review of Systems:  No headache, visual changes, nausea, vomiting, diarrhea, constipation, dizziness, abdominal pain, skin rash, fevers, chills, night sweats, weight loss, swollen lymph nodes, body aches, joint swelling, chest pain, shortness of breath, mood changes. POSITIVE muscle aches  Objective  Blood pressure 130/80, pulse 65, height 6' 3 (1.905 m), weight 209 lb (94.8 kg), SpO2 98%.   General: No apparent distress alert and oriented x3 mood and affect normal, dressed appropriately.  HEENT: Pupils equal, extraocular movements intact  Respiratory: Patient's speak in full sentences and does not appear short of breath  Cardiovascular: No lower extremity edema, non tender,  no erythema  Gait MSK:  Back does have some mild loss lordosis.  Neck exam does have tightness noted in the occipital area.  Some tightness of the fascia in the area.  Negative Spurling's noted today.  Some tightness noted over the sacroiliac joints bilaterally.  Osteopathic findings  C2 flexed rotated and side bent right C6 flexed rotated and side bent left T9 extended rotated and side bent left L2 flexed rotated and side bent right Sacrum right on right     Assessment and Plan:  Cervical radiculopathy at C8 Stable at the moment.  No radicular symptoms.  Continuing to be active where possible.  Will increase activity slowly if necessary.  Will continue to monitor.  Will be traveling and could be potentially getting some difficulty.  Discussed icing regimen and home exercises.  Follow-up again in 6 to 8 weeks.    Nonallopathic problems  Decision today to treat with OMT was based on Physical Exam  After verbal consent patient was treated with HVLA, ME, FPR techniques in cervical, , thoracic, lumbar, and sacral  areas  Patient tolerated the procedure well with improvement in symptoms  Patient given exercises, stretches and lifestyle modifications  See medications in patient instructions if given  Patient will follow up in 4-8 weeks    The above documentation has been reviewed and is accurate and complete Stephen [name] M Stephen Aguinaga, DO          Note: This dictation was prepared with Dragon dictation along with smaller phrase technology. Any transcriptional errors  that result from this process are unintentional.

## 2023-11-21 ENCOUNTER — Other Ambulatory Visit (HOSPITAL_BASED_OUTPATIENT_CLINIC_OR_DEPARTMENT_OTHER): Payer: Self-pay

## 2023-11-21 ENCOUNTER — Other Ambulatory Visit: Payer: Self-pay

## 2023-11-22 ENCOUNTER — Other Ambulatory Visit: Payer: Self-pay

## 2023-11-22 ENCOUNTER — Encounter: Payer: Self-pay | Admitting: Family Medicine

## 2023-11-22 ENCOUNTER — Other Ambulatory Visit (HOSPITAL_BASED_OUTPATIENT_CLINIC_OR_DEPARTMENT_OTHER): Payer: Self-pay

## 2023-11-22 ENCOUNTER — Ambulatory Visit: Admitting: Family Medicine

## 2023-11-22 VITALS — BP 130/80 | HR 65 | Ht 75.0 in | Wt 209.0 lb

## 2023-11-22 DIAGNOSIS — M9904 Segmental and somatic dysfunction of sacral region: Secondary | ICD-10-CM | POA: Diagnosis not present

## 2023-11-22 DIAGNOSIS — M9902 Segmental and somatic dysfunction of thoracic region: Secondary | ICD-10-CM

## 2023-11-22 DIAGNOSIS — M9901 Segmental and somatic dysfunction of cervical region: Secondary | ICD-10-CM | POA: Diagnosis not present

## 2023-11-22 DIAGNOSIS — M5412 Radiculopathy, cervical region: Secondary | ICD-10-CM | POA: Diagnosis not present

## 2023-11-22 DIAGNOSIS — M9903 Segmental and somatic dysfunction of lumbar region: Secondary | ICD-10-CM | POA: Diagnosis not present

## 2023-11-22 NOTE — Assessment & Plan Note (Signed)
 Stable at the moment.  No radicular symptoms.  Continuing to be active where possible.  Will increase activity slowly if necessary.  Will continue to monitor.  Will be traveling and could be potentially getting some difficulty.  Discussed icing regimen and home exercises.  Follow-up again in 6 to 8 weeks.

## 2023-11-22 NOTE — Patient Instructions (Signed)
Good to see you See me again in 2 months 

## 2023-11-23 ENCOUNTER — Other Ambulatory Visit (HOSPITAL_BASED_OUTPATIENT_CLINIC_OR_DEPARTMENT_OTHER): Payer: Self-pay

## 2023-11-23 ENCOUNTER — Other Ambulatory Visit: Payer: Self-pay | Admitting: Family Medicine

## 2023-11-23 DIAGNOSIS — E782 Mixed hyperlipidemia: Secondary | ICD-10-CM

## 2023-11-23 DIAGNOSIS — I10 Essential (primary) hypertension: Secondary | ICD-10-CM

## 2023-11-23 NOTE — Telephone Encounter (Unsigned)
 Copied from CRM #[id number]. Topic: Clinical - Medication Refill >> Nov 23, 2023 12:55 PM Suzette B wrote: Medication:  lisinopril  (ZESTRIL ) 20 MG tablet pravastatin  (PRAVACHOL ) 40 MG tablet  Has the patient contacted their pharmacy? Yes Pharmacy is currently waiting on approval    This is the patient's preferred pharmacy:  [facility] [place] [facility] [address] [place] [address] Phone: [phone] Fax: [phone]  Is this the correct pharmacy for this prescription? Yes If no, delete pharmacy and type the correct one.   Has the prescription been filled recently? Yes  Is the patient out of the medication? Yes  Has the patient been seen for an appointment in the last year OR does the patient have an upcoming appointment? No  Can we respond through MyChart? Yes  Agent: Please be advised that Rx refills may take up to 3 business days. We ask that you follow-up with your pharmacy.

## 2023-11-24 ENCOUNTER — Other Ambulatory Visit (HOSPITAL_BASED_OUTPATIENT_CLINIC_OR_DEPARTMENT_OTHER): Payer: Self-pay

## 2023-11-24 ENCOUNTER — Telehealth: Payer: Self-pay

## 2023-11-24 DIAGNOSIS — E782 Mixed hyperlipidemia: Secondary | ICD-10-CM

## 2023-11-24 DIAGNOSIS — I10 Essential (primary) hypertension: Secondary | ICD-10-CM

## 2023-11-24 MED ORDER — LISINOPRIL 20 MG PO TABS
20.0000 mg | ORAL_TABLET | Freq: Every day | ORAL | 0 refills | Status: DC
  Filled 2023-11-24: qty 30, 30d supply, fill #0

## 2023-11-24 MED ORDER — PRAVASTATIN SODIUM 40 MG PO TABS
40.0000 mg | ORAL_TABLET | Freq: Every day | ORAL | 0 refills | Status: DC
  Filled 2023-11-24: qty 30, 30d supply, fill #0

## 2023-11-24 NOTE — Telephone Encounter (Signed)
 Copied from CRM [id number]. Topic: Clinical - Prescription Issue >> Nov 24, 2023 10:56 AM Stephen [name] wrote: Reason for CRM: [id number], patient calling for an update on the prescription of status he stated he first submitted via mychart, I wasn't able to locate. I also reminded him of the turnaround time for the refill request he's concerned because Dr. Bevin is not there. He stated he's out of meds please call patient to update.

## 2023-11-24 NOTE — Telephone Encounter (Signed)
 Patient came into office to see if he could get refills on the following medications:  lisinopril  (ZESTRIL ) 20 MG tablet  pravastatin  (PRAVACHOL ) 40 MG tablet   Patient is scheduled with [facility] for Physical 12/01/23.

## 2023-11-24 NOTE — Telephone Encounter (Signed)
 Patient informed that prescriptions have been sent to pharmacy - [facility]

## 2023-12-01 ENCOUNTER — Ambulatory Visit (INDEPENDENT_AMBULATORY_CARE_PROVIDER_SITE_OTHER): Admitting: Urgent Care

## 2023-12-01 ENCOUNTER — Other Ambulatory Visit (HOSPITAL_BASED_OUTPATIENT_CLINIC_OR_DEPARTMENT_OTHER): Payer: Self-pay

## 2023-12-01 ENCOUNTER — Encounter: Payer: Self-pay | Admitting: Urgent Care

## 2023-12-01 VITALS — BP 152/87 | HR 67 | Ht 75.0 in | Wt 206.0 lb

## 2023-12-01 DIAGNOSIS — R7303 Prediabetes: Secondary | ICD-10-CM | POA: Insufficient documentation

## 2023-12-01 DIAGNOSIS — I10 Essential (primary) hypertension: Secondary | ICD-10-CM

## 2023-12-01 DIAGNOSIS — E782 Mixed hyperlipidemia: Secondary | ICD-10-CM | POA: Diagnosis not present

## 2023-12-01 DIAGNOSIS — D126 Benign neoplasm of colon, unspecified: Secondary | ICD-10-CM | POA: Diagnosis not present

## 2023-12-01 DIAGNOSIS — Z23 Encounter for immunization: Secondary | ICD-10-CM

## 2023-12-01 DIAGNOSIS — Z0000 Encounter for general adult medical examination without abnormal findings: Secondary | ICD-10-CM | POA: Diagnosis not present

## 2023-12-01 DIAGNOSIS — Z125 Encounter for screening for malignant neoplasm of prostate: Secondary | ICD-10-CM | POA: Diagnosis not present

## 2023-12-01 MED ORDER — LISINOPRIL 20 MG PO TABS
20.0000 mg | ORAL_TABLET | Freq: Every day | ORAL | 3 refills | Status: AC
  Filled 2023-12-01 – 2023-12-20 (×2): qty 90, 90d supply, fill #0
  Filled 2024-03-18: qty 90, 90d supply, fill #1

## 2023-12-01 MED ORDER — PRAVASTATIN SODIUM 40 MG PO TABS
40.0000 mg | ORAL_TABLET | Freq: Every day | ORAL | 3 refills | Status: AC
  Filled 2023-12-01 – 2023-12-20 (×2): qty 90, 90d supply, fill #0
  Filled 2024-03-18: qty 90, 90d supply, fill #1

## 2023-12-01 NOTE — Progress Notes (Incomplete)
 Complete physical exam  Patient: Stephen [name].   DOB: 08-10-1960   63 y.o. Male  MRN: [id number]  Subjective:    Chief Complaint  Patient presents with  . Annual Exam    fasting    [name]. is a 63 y.o. male who presents today for a complete physical exam. He reports consuming a general and low sodium diet. The patient does not participate in regular exercise at present. He generally feels well. He reports sleeping well. He does not have additional problems to discuss today.   Most recent fall risk assessment:    10/29/2022   10:54 AM  Fall Risk   Falls in the past year? 0  Number falls in past yr: 0  Injury with Fall? 0  Risk for fall due to : No Fall Risks  Follow up Falls evaluation completed     Most recent depression screenings:    12/01/2023    9:26 AM 10/29/2022   10:54 AM  PHQ 2/9 Scores  PHQ - 2 Score 0 0  PHQ- 9 Score  0    Vision:Within last year and Dental: No current dental problems and Receives regular dental care  Patient Active Problem List   Diagnosis Date Noted  . Prediabetes 12/01/2023  . Adenomatous polyp of colon 12/01/2023  . COVID-19 11/19/2021  . Median nerve neuropathy, right 09/28/2021  . Retained orthopedic hardware   . Closed fracture of right distal radius 04/07/2021  . Heme positive stool 08/06/2020  . Hordeolum externum of right upper eyelid 08/06/2020  . Right hand pain 11/15/2017  . Dermatitis due to drug reaction 09/02/2017  . Drug-induced erythroderma 09/02/2017  . Laryngitis, acute 08/04/2017  . Nonallopathic lesion of cervical region 04/06/2017  . Nonallopathic lesion of thoracic region 04/06/2017  . Nonallopathic lesion of lumbosacral region 04/06/2017  . Cervical radiculopathy at C8 03/04/2017  . Excessive cerumen in both ear canals 02/15/2017  . Neuropathy, cervical plexus 02/15/2017  . Poison ivy dermatitis 07/29/2015  . Pharyngitis 09/26/2013  . Back strain 06/11/2013  . Healthcare maintenance 04/25/2011   . Mixed hyperlipidemia 03/14/2010  . Essential hypertension 03/14/2010   Past Medical History:  Diagnosis Date  . Allergy    Hayfever  . Anxiety   . Colon polyps   . Detached retina, right 03/22/1990   Dr. Marjorie at [facility] did orbital band procedure with good results.   . High cholesterol   . Hypertension   . Poison ivy dermatitis 07/29/2015   Past Surgical History:  Procedure Laterality Date  . COLONOSCOPY  04/2007   Dr.Magod  . COLONOSCOPY  09/06/2014   Dr.Magod  . EYE SURGERY  1992   Detached retina & tear in retina  . HARDWARE REMOVAL Right 08/12/2021   Procedure: RIGHT WRIST REMOVAL OF HARDWARE, EXTENSOR TENODESIS;  Surgeon: Romona Harari, MD;  Location: [facility];  Service: Orthopedics;  Laterality: Right;  . OPEN REDUCTION INTERNAL FIXATION (ORIF) DISTAL RADIAL FRACTURE Right 04/08/2021   Procedure: Right OPEN REDUCTION INTERNAL FIXATION (ORIF) DISTAL RADIAL FRACTURE;  Surgeon: Romona Harari, MD;  Location: [facility] OR;  Service: Orthopedics;  Laterality: Right;  . RETINAL DETACHMENT SURGERY     30 years ago   Social History   Tobacco Use  . Smoking status: Never  . Smokeless tobacco: Never  Vaping Use  . Vaping status: Never Used  Substance Use Topics  . Alcohol use: Yes    Comment: 1 occasional drink  . Drug use: No  Patient Care Team: Lowella Benton [place], [place] as PCP - General (Physician Assistant) Helga Slice, MD (Dermatology)   Outpatient Medications Prior to Visit  Medication Sig  . Multiple Vitamin (MULTIVITAMIN) tablet Take 1 tablet by mouth daily.  . Omega-3 Fatty Acids (SALMON OIL-1000 PO) Take 1,000 mg by mouth daily.  . venlafaxine  XR (EFFEXOR -XR) 37.5 MG 24 hr capsule Take 2 capsules (75 mg total) by mouth daily with breakfast.  . [DISCONTINUED] lisinopril  (ZESTRIL ) 20 MG tablet Take 1 tablet (20 mg total) by mouth daily.  . [DISCONTINUED] pravastatin  (PRAVACHOL ) 40 MG tablet Take 1 tablet (40 mg total) by mouth daily.  .  venlafaxine  XR (EFFEXOR -XR) 37.5 MG 24 hr capsule Take 2 capsules (75 mg total) by mouth daily with breakfast.  . [DISCONTINUED] amoxicillin -clavulanate (AUGMENTIN ) 875-125 MG tablet Take 1 tablet by mouth 2 (two) times daily.  . [DISCONTINUED] guaiFENesin -codeine  100-10 MG/5ML syrup Take 5 mLs by mouth 3 (three) times daily as needed for cough.  . [DISCONTINUED] lisinopril  (ZESTRIL ) 20 MG tablet Take 1 tablet (20 mg total) by mouth daily.  . [DISCONTINUED] pravastatin  (PRAVACHOL ) 40 MG tablet Take 1 tablet (40 mg total) by mouth at bedtime.   No facility-administered medications prior to visit.    ROS Complete 12 point ROS performed with all pertinent positives listed in HPI      Objective:     BP (!) 152/87   Pulse 67   Ht 6' 3 (1.905 m)   Wt 206 lb (93.4 kg)   SpO2 99%   BMI 25.75 kg/m  BP Readings from Last 3 Encounters:  12/01/23 (!) 152/87  11/22/23 130/80  09/19/23 132/82   Wt Readings from Last 3 Encounters:  12/01/23 206 lb (93.4 kg)  11/22/23 209 lb (94.8 kg)  09/19/23 206 lb (93.4 kg)      Physical Exam Vitals and nursing note reviewed.  Constitutional:      General: He is not in acute distress.    Appearance: Normal appearance. He is not ill-appearing, toxic-appearing or diaphoretic.  HENT:     Head: Normocephalic and atraumatic.     Right Ear: Tympanic membrane, ear canal and external ear normal. There is no impacted cerumen.     Left Ear: Tympanic membrane, ear canal and external ear normal. There is no impacted cerumen.     Nose: Nose normal.     Mouth/Throat:     Mouth: Mucous membranes are moist.     Pharynx: Oropharynx is clear. No oropharyngeal exudate or posterior oropharyngeal erythema.  Eyes:     General: No scleral icterus.       Right eye: No discharge.        Left eye: No discharge.     Extraocular Movements: Extraocular movements intact.     Pupils: Pupils are equal, round, and reactive to light.  Neck:     Thyroid : No thyroid  mass,  thyromegaly or thyroid  tenderness.  Cardiovascular:     Rate and Rhythm: Normal rate and regular rhythm.     Pulses: Normal pulses.     Heart sounds: No murmur heard. Pulmonary:     Effort: Pulmonary effort is normal. No respiratory distress.     Breath sounds: Normal breath sounds. No stridor. No wheezing or rhonchi.  Abdominal:     General: Abdomen is flat. Bowel sounds are normal. There is no distension.     Palpations: Abdomen is soft. There is no mass.     Tenderness: There is no abdominal tenderness. There is  no guarding.  Musculoskeletal:     Cervical back: Normal range of motion and neck supple. No rigidity or tenderness.     Right lower leg: No edema.     Left lower leg: No edema.  Lymphadenopathy:     Cervical: No cervical adenopathy.  Skin:    General: Skin is warm and dry.     Coloration: Skin is not jaundiced.     Findings: Lesion (multiple SKs to back. One darkly pigmented to midline upper back) present. No bruising, erythema or rash.  Neurological:     General: No focal deficit present.     Mental Status: He is alert and oriented to person, place, and time.     Sensory: No sensory deficit.     Motor: No weakness.  Psychiatric:        Mood and Affect: Mood normal.        Behavior: Behavior normal.      No results found for any visits on 12/01/23.    Last CBC Lab Results  Component Value Date   WBC 7.5 10/29/2022   HGB 14.1 10/29/2022   HCT 42.4 10/29/2022   MCV 93 10/29/2022   MCH 30.8 10/29/2022   RDW 12.3 10/29/2022   PLT 176 10/29/2022   Last metabolic panel Lab Results  Component Value Date   GLUCOSE 130 (H) 10/29/2022   NA 142 10/29/2022   K 4.7 10/29/2022   CL 103 10/29/2022   CO2 21 10/29/2022   BUN 23 10/29/2022   CREATININE 1.09 10/29/2022   EGFR 77 10/29/2022   CALCIUM 9.0 10/29/2022   PROT 6.4 08/06/2020   ALBUMIN 4.3 08/06/2020   BILITOT 0.7 08/06/2020   ALKPHOS 64 08/06/2020   AST 26 08/06/2020   ALT 37 08/06/2020   ANIONGAP  13 04/08/2021   Last lipids Lab Results  Component Value Date   CHOL 142 10/29/2022   HDL 51 10/29/2022   LDLCALC 78 10/29/2022   TRIG 60 10/29/2022   CHOLHDL 2.8 10/29/2022   Last hemoglobin A1c Lab Results  Component Value Date   HGBA1C 6.1 (H) 10/29/2022   Last thyroid  functions Lab Results  Component Value Date   TSH 0.69 02/15/2017   Last vitamin D No results found for: 25OHVITD2, 25OHVITD3, VD25OH Last vitamin B12 and Folate No results found for: VITAMINB12, FOLATE      Assessment & Plan:    Routine Health Maintenance and Physical Exam  Immunization History  Administered Date(s) Administered  . Influenza Whole 02/16/2012, 01/06/2022  . Influenza, Seasonal, Injecte, Preservative Fre 12/30/2022  . Influenza-Unspecified 11/20/2012, 12/20/2016, 12/14/2017, 01/05/2023  . PFIZER Comirnaty (Gray Top)Covid-19 Tri-Sucrose Vaccine 10/10/2020  . PFIZER(Purple Top)SARS-COV-2 Vaccination 11/04/2019, 12/18/2019, 05/03/2020  . Pfizer Covid-19 Vaccine Bivalent Booster 5yrs & up 01/16/2021  . Pfizer(Comirnaty )Fall Seasonal Vaccine 12 years and older 04/01/2022  . Tdap 12/16/2018  . Zoster Recombinant(Shingrix ) 10/28/2022, 02/25/2023    Health Maintenance  Topic Date Due  . Pneumococcal Vaccine: 50+ Years (1 of 1 - PCV) Never done  . Influenza Vaccine  10/21/2023  . COVID-19 Vaccine (7 - 2025-26 season) 11/21/2023  . Colonoscopy  12/27/2023  . DTaP/Tdap/Td (2 - Td or Tdap) 12/15/2028  . Hepatitis C Screening  Completed  . HIV Screening  Completed  . Zoster Vaccines- Shingrix   Completed  . Hepatitis B Vaccines 19-59 Average Risk  Aged Out  . HPV VACCINES  Aged Out  . Meningococcal B Vaccine  Aged Out    Discussed health benefits of physical activity, and  encouraged him to engage in regular exercise appropriate for his age and condition.  Problem List Items Addressed This Visit     Mixed hyperlipidemia   Relevant Medications   pravastatin  (PRAVACHOL ) 40 MG  tablet   lisinopril  (ZESTRIL ) 20 MG tablet   Other Relevant Orders   Lipid panel   Essential hypertension   Relevant Medications   pravastatin  (PRAVACHOL ) 40 MG tablet   lisinopril  (ZESTRIL ) 20 MG tablet   Other Relevant Orders   TSH   Comprehensive metabolic panel with GFR   Prediabetes   Relevant Orders   Hemoglobin A1c   Adenomatous polyp of colon   Relevant Orders   Ambulatory referral to Gastroenterology   Other Visit Diagnoses       Routine adult health maintenance    -  Primary   Relevant Orders   CBC with Differential/Platelet   Hemoglobin A1c   TSH   Lipid panel   Comprehensive metabolic panel with GFR   PSA     Need for vaccination for Strep pneumoniae         Prostate cancer screening       Relevant Orders   PSA      Return in about 1 year (around 11/30/2024) for Annual Physical.     Benton LITTIE Gave, PA

## 2023-12-01 NOTE — Progress Notes (Unsigned)
 Complete physical exam  Patient: Stephen [name].   DOB: July 08, 1960   63 y.o. Male  MRN: [id number]  Subjective:    Chief Complaint  Patient presents with   Annual Exam    fasting    Marina Desire. is a 63 y.o. male who presents today for a complete physical exam. He reports consuming a general and low sodium diet. The patient does not participate in regular exercise at present. He generally feels well. He reports sleeping well. He does not have additional problems to discuss today.   Discussed the use of AI scribe software for clinical note transcription with the patient, who gave verbal consent to proceed.  History of Present Illness   Stephen [name]. Ed is a 63 year old male who presents for an annual physical exam.  He is managing hypertension with lisinopril  20 mg daily, which he took this morning. He occasionally monitors his blood pressure at home, reporting normal readings, although today's reading was slightly higher than usual. He has been given a temporary refill of his blood pressure medication until today's appointment.  He is managing hyperlipidemia with pravastatin  and reports no side effects or concerns with this medication. He also takes omega-3 supplements over the counter for cholesterol management.  He is under the care of a sports medicine specialist for neck and shoulder symptoms, initially presenting with wrist and shoulder pain. He receives treatment every six to eight weeks, which he describes as similar to chiropractic adjustments. He is also taking venlafaxine  37.5 mg capsules, two in the morning, for nerve irritation, which he finds effective.  His last annual labs from August of the previous year showed an A1c of 6.1%, indicating prediabetes, but he is not on medication for this. He manages his diet by watching his salt intake for blood pressure control.  He engages in physical activity through household chores and occasional walking at work, but does  not participate in specific cardiovascular or gym activities. He reports good sleep quality and follows up with an eye doctor and dentist annually. 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       Most recent fall risk assessment:    10/29/2022   10:54 AM  Fall Risk   Falls in the past year? 0  Number falls in past yr: 0  Injury with Fall? 0  Risk for fall due to : No Fall Risks  Follow up Falls evaluation completed     Most recent depression screenings:    12/01/2023    9:26 AM 10/29/2022   10:54 AM  PHQ 2/9 Scores  PHQ - 2 Score 0 0  PHQ- 9 Score  0    Vision:Within last year and Dental: No current dental problems and Receives regular dental care  Patient Active Problem List   Diagnosis Date Noted   Prediabetes 12/01/2023   Adenomatous polyp of colon 12/01/2023   COVID-19 11/19/2021   Median nerve neuropathy, right 09/28/2021   Retained orthopedic hardware    Closed fracture of right distal radius 04/07/2021   Heme positive stool 08/06/2020   Hordeolum externum of right upper eyelid 08/06/2020   Right hand pain 11/15/2017   Dermatitis due to drug reaction 09/02/2017   Drug-induced erythroderma 09/02/2017   Laryngitis, acute 08/04/2017   Nonallopathic lesion of cervical region 04/06/2017   Nonallopathic lesion of thoracic region 04/06/2017   Nonallopathic lesion of lumbosacral region  04/06/2017   Cervical radiculopathy at C8 03/04/2017   Excessive cerumen in both ear canals 02/15/2017   Neuropathy, cervical plexus 02/15/2017   Poison ivy dermatitis 07/29/2015   Pharyngitis 09/26/2013   Back strain 06/11/2013   Healthcare maintenance 04/25/2011   Mixed hyperlipidemia 03/14/2010   Essential hypertension 03/14/2010   Past Medical History:  Diagnosis Date   Allergy    Hayfever   Anxiety    Colon polyps    Detached retina, right 03/22/1990   Dr. Marjorie at [facility] did  orbital band procedure with good results.    High cholesterol    Hypertension    Poison ivy dermatitis 07/29/2015   Past Surgical History:  Procedure Laterality Date   COLONOSCOPY  04/2007   Dr.Magod   COLONOSCOPY  09/06/2014   Dr.Magod   EYE SURGERY  1992   Detached retina & tear in retina   HARDWARE REMOVAL Right 08/12/2021   Procedure: RIGHT WRIST REMOVAL OF HARDWARE, EXTENSOR TENODESIS;  Surgeon: Romona Harari, MD;  Location:  SURGERY CENTER;  Service: Orthopedics;  Laterality: Right;   OPEN REDUCTION INTERNAL FIXATION (ORIF) DISTAL RADIAL FRACTURE Right 04/08/2021   Procedure: Right OPEN REDUCTION INTERNAL FIXATION (ORIF) DISTAL RADIAL FRACTURE;  Surgeon: Romona Harari, MD;  Location: [facility] OR;  Service: Orthopedics;  Laterality: Right;   RETINAL DETACHMENT SURGERY     30 years ago   Social History   Tobacco Use   Smoking status: Never   Smokeless tobacco: Never  Vaping Use   Vaping status: Never Used  Substance Use Topics   Alcohol use: Yes    Comment: 1 occasional drink   Drug use: No      Patient Care Team: Lowella Benton CROME, [place] as PCP - General (Physician Assistant) Helga Slice, MD (Dermatology)   Outpatient Medications Prior to Visit  Medication Sig   Multiple Vitamin (MULTIVITAMIN) tablet Take 1 tablet by mouth daily.   Omega-3 Fatty Acids (SALMON OIL-1000 PO) Take 1,000 mg by mouth daily.   venlafaxine  XR (EFFEXOR -XR) 37.5 MG 24 hr capsule Take 2 capsules (75 mg total) by mouth daily with breakfast.   [DISCONTINUED] lisinopril  (ZESTRIL ) 20 MG tablet Take 1 tablet (20 mg total) by mouth daily.   [DISCONTINUED] pravastatin  (PRAVACHOL ) 40 MG tablet Take 1 tablet (40 mg total) by mouth daily.   venlafaxine  XR (EFFEXOR -XR) 37.5 MG 24 hr capsule Take 2 capsules (75 mg total) by mouth daily with breakfast.   [DISCONTINUED] amoxicillin -clavulanate (AUGMENTIN ) 875-125 MG tablet Take 1 tablet by mouth 2 (two) times daily.   [DISCONTINUED]  guaiFENesin -codeine  100-10 MG/5ML syrup Take 5 mLs by mouth 3 (three) times daily as needed for cough.   [DISCONTINUED] lisinopril  (ZESTRIL ) 20 MG tablet Take 1 tablet (20 mg total) by mouth daily.   [DISCONTINUED] pravastatin  (PRAVACHOL ) 40 MG tablet Take 1 tablet (40 mg total) by mouth at bedtime.   No facility-administered medications prior to visit.    ROS Complete 12 point ROS performed with all pertinent positives listed in HPI      Objective:     BP (!) 152/87   Pulse 67   Ht 6' 3 (1.905 m)   Wt 206 lb (93.4 kg)   SpO2 99%   BMI 25.75 kg/m  BP Readings from Last 3 Encounters:  12/01/23 (!) 152/87  11/22/23 130/80  09/19/23 132/82   Wt Readings from Last 3 Encounters:  12/01/23 206 lb (93.4 kg)  11/22/23 209 lb (94.8 kg)  09/19/23 206 lb (93.4 kg)  Physical Exam Vitals and nursing note reviewed.  Constitutional:      General: He is not in acute distress.    Appearance: Normal appearance. He is not ill-appearing, toxic-appearing or diaphoretic.  HENT:     Head: Normocephalic and atraumatic.     Right Ear: Tympanic membrane, ear canal and external ear normal. There is no impacted cerumen.     Left Ear: Tympanic membrane, ear canal and external ear normal. There is no impacted cerumen.     Nose: Nose normal.     Mouth/Throat:     Mouth: Mucous membranes are moist.     Pharynx: Oropharynx is clear. No oropharyngeal exudate or posterior oropharyngeal erythema.  Eyes:     General: No scleral icterus.       Right eye: No discharge.        Left eye: No discharge.     Extraocular Movements: Extraocular movements intact.     Pupils: Pupils are equal, round, and reactive to light.  Neck:     Thyroid : No thyroid  mass, thyromegaly or thyroid  tenderness.  Cardiovascular:     Rate and Rhythm: Normal rate and regular rhythm.     Pulses: Normal pulses.     Heart sounds: No murmur heard. Pulmonary:     Effort: Pulmonary effort is normal. No respiratory distress.      Breath sounds: Normal breath sounds. No stridor. No wheezing or rhonchi.  Abdominal:     General: Abdomen is flat. Bowel sounds are normal. There is no distension.     Palpations: Abdomen is soft. There is no mass.     Tenderness: There is no abdominal tenderness. There is no guarding.  Musculoskeletal:     Cervical back: Normal range of motion and neck supple. No rigidity or tenderness.     Right lower leg: No edema.     Left lower leg: No edema.  Lymphadenopathy:     Cervical: No cervical adenopathy.  Skin:    General: Skin is warm and dry.     Coloration: Skin is not jaundiced.     Findings: Lesion (multiple SKs to back. One darkly pigmented to midline upper back) present. No bruising, erythema or rash.  Neurological:     General: No focal deficit present.     Mental Status: He is alert and oriented to person, place, and time.     Sensory: No sensory deficit.     Motor: No weakness.  Psychiatric:        Mood and Affect: Mood normal.        Behavior: Behavior normal.      No results found for any visits on 12/01/23.    Last CBC Lab Results  Component Value Date   WBC 7.5 10/29/2022   HGB 14.1 10/29/2022   HCT 42.4 10/29/2022   MCV 93 10/29/2022   MCH 30.8 10/29/2022   RDW 12.3 10/29/2022   PLT 176 10/29/2022   Last metabolic panel Lab Results  Component Value Date   GLUCOSE 130 (H) 10/29/2022   NA 142 10/29/2022   K 4.7 10/29/2022   CL 103 10/29/2022   CO2 21 10/29/2022   BUN 23 10/29/2022   CREATININE 1.09 10/29/2022   EGFR 77 10/29/2022   CALCIUM 9.0 10/29/2022   PROT 6.4 08/06/2020   ALBUMIN 4.3 08/06/2020   BILITOT 0.7 08/06/2020   ALKPHOS 64 08/06/2020   AST 26 08/06/2020   ALT 37 08/06/2020   ANIONGAP 13 04/08/2021   Last lipids Lab  Results  Component Value Date   CHOL 142 10/29/2022   HDL 51 10/29/2022   LDLCALC 78 10/29/2022   TRIG 60 10/29/2022   CHOLHDL 2.8 10/29/2022   Last hemoglobin A1c Lab Results  Component Value Date    HGBA1C 6.1 (H) 10/29/2022   Last thyroid  functions Lab Results  Component Value Date   TSH 0.69 02/15/2017   Last vitamin D No results found for: 25OHVITD2, 25OHVITD3, VD25OH Last vitamin B12 and Folate No results found for: VITAMINB12, FOLATE      Assessment & Plan:    Routine Health Maintenance and Physical Exam  Immunization History  Administered Date(s) Administered   Influenza Whole 02/16/2012, 01/06/2022   Influenza, Seasonal, Injecte, Preservative Fre 12/30/2022   Influenza-Unspecified 11/20/2012, 12/20/2016, 12/14/2017, 01/05/2023   PFIZER Comirnaty ETTERGray Top)Covid-19 Tri-Sucrose Vaccine 10/10/2020   PFIZER(Purple Top)SARS-COV-2 Vaccination 11/04/2019, 12/18/2019, 05/03/2020   Pfizer Covid-19 Vaccine Bivalent Booster 63yrs & up 01/16/2021   Pfizer(Comirnaty )Fall Seasonal Vaccine 12 years and older 04/01/2022   Tdap 12/16/2018   Zoster Recombinant(Shingrix ) 10/28/2022, 02/25/2023    Health Maintenance  Topic Date Due   Pneumococcal Vaccine: 50+ Years (1 of 1 - PCV) Never done   Influenza Vaccine  10/21/2023   COVID-19 Vaccine (7 - 2025-26 season) 11/21/2023   Colonoscopy  12/27/2023   DTaP/Tdap/Td (2 - Td or Tdap) 12/15/2028   Hepatitis C Screening  Completed   HIV Screening  Completed   Zoster Vaccines- Shingrix   Completed   Hepatitis B Vaccines 19-59 Average Risk  Aged Out   HPV VACCINES  Aged Out   Meningococcal B Vaccine  Aged Out    Discussed health benefits of physical activity, and encouraged him to engage in regular exercise appropriate for his age and condition.  Problem List Items Addressed This Visit     Mixed hyperlipidemia   Relevant Medications   pravastatin  (PRAVACHOL ) 40 MG tablet   lisinopril  (ZESTRIL ) 20 MG tablet   Other Relevant Orders   Lipid panel   Essential hypertension   Relevant Medications   pravastatin  (PRAVACHOL ) 40 MG tablet   lisinopril  (ZESTRIL ) 20 MG tablet   Other Relevant Orders   TSH   Comprehensive  metabolic panel with GFR   Prediabetes   Relevant Orders   Hemoglobin A1c   Adenomatous polyp of colon   Relevant Orders   Ambulatory referral to Gastroenterology   Other Visit Diagnoses       Routine adult health maintenance    -  Primary   Relevant Orders   CBC with Differential/Platelet   Hemoglobin A1c   TSH   Lipid panel   Comprehensive metabolic panel with GFR   PSA     Need for vaccination for Strep pneumoniae         Prostate cancer screening       Relevant Orders   PSA      Return in about 1 year (around 11/30/2024) for Annual Physical.  Assessment and Plan    Adult Wellness Visit Routine wellness visit with no major health concerns. Engages in physical activity and manages diet for blood pressure. - Continue current lifestyle and dietary habits. - Ensure regular follow-up with ophthalmologist and dentist.  Essential hypertension Hypertension managed with lisinopril  20 mg. Home readings generally normal, slightly elevated today. - All prior readings WNL - Send in a full year's supply of lisinopril .  Mixed hyperlipidemia Managed with pravastatin . Cholesterol levels at goal, no side effects. - Send in a full year's supply of pravastatin .  Pre-diabetes Pre-diabetes  with A1c of 6.1% last year, managed with dietary modifications. - Recheck A1c.  Colonic tubular adenoma, post-polypectomy surveillance High-risk colon cancer surveillance due to family history. Previous colonoscopy in 2022 showed sessile polyps and a tubular adenoma. - Place referral for colonoscopy for high-risk colon cancer surveillance. - Contact previous provider for colonoscopy scheduling.  Pigmented skin lesion midline upper back Pigmented lesion on back noted to be darker. No recent dermatology follow-up. - Document the pigmented lesion in the chart for pt monitoring/ surveillance  General Health Maintenance Up to date on tetanus vaccination. Due for pneumococcal vaccination. Flu shot to  be received at his office. - Office supply of pneumococcal vaccine (Prevnar 20) just ran out - pt to schedule nurse visit to complete.         Benton LITTIE Gave, PA

## 2023-12-01 NOTE — Patient Instructions (Signed)
 We completed your annual PE today.  Please return for a nurse visit for your pneumonia vaccine as our current stock ran out.  I have refilled your BP and cholesterol medication.  Call your gastro doctor to schedule repeat colonoscopy.  Send us  a copy of your completed flu vaccine.  Please monitor the mole on your back (see photos on mychart)  Return annually, sooner as needed.

## 2023-12-02 ENCOUNTER — Ambulatory Visit: Payer: Self-pay | Admitting: Urgent Care

## 2023-12-02 LAB — COMPREHENSIVE METABOLIC PANEL WITH GFR
ALT: 47 IU/L — ABNORMAL HIGH (ref 0–44)
AST: 32 IU/L (ref 0–40)
Albumin: 4.6 g/dL (ref 3.9–4.9)
Alkaline Phosphatase: 78 IU/L (ref 44–121)
BUN/Creatinine Ratio: 17 (ref 10–24)
BUN: 20 mg/dL (ref 8–27)
Bilirubin Total: 0.6 mg/dL (ref 0.0–1.2)
CO2: 24 mmol/L (ref 20–29)
Calcium: 9.3 mg/dL (ref 8.6–10.2)
Chloride: 103 mmol/L (ref 96–106)
Creatinine, Ser: 1.15 mg/dL (ref 0.76–1.27)
Globulin, Total: 2.1 g/dL (ref 1.5–4.5)
Glucose: 93 mg/dL (ref 70–99)
Potassium: 4.5 mmol/L (ref 3.5–5.2)
Sodium: 142 mmol/L (ref 134–144)
Total Protein: 6.7 g/dL (ref 6.0–8.5)
eGFR: 72 mL/min/1.73 (ref >59)

## 2023-12-02 LAB — CBC WITH DIFFERENTIAL/PLATELET
Basophils Absolute: 0.1 x10E3/uL (ref 0.0–0.2)
Basos: 1 %
EOS (ABSOLUTE): 0.3 x10E3/uL (ref 0.0–0.4)
Eos: 6 %
Hematocrit: 43.3 % (ref 37.5–51.0)
Hemoglobin: 14.3 g/dL (ref 13.0–17.7)
Immature Grans (Abs): 0 x10E3/uL (ref 0.0–0.1)
Immature Granulocytes: 0 %
Lymphocytes Absolute: 1.9 x10E3/uL (ref 0.7–3.1)
Lymphs: 40 %
MCH: 30.6 pg (ref 26.6–33.0)
MCHC: 33 g/dL (ref 31.5–35.7)
MCV: 93 fL (ref 79–97)
Monocytes Absolute: 0.4 x10E3/uL (ref 0.1–0.9)
Monocytes: 9 %
Neutrophils Absolute: 2.1 x10E3/uL (ref 1.4–7.0)
Neutrophils: 44 %
Platelets: 182 x10E3/uL (ref 150–450)
RBC: 4.67 x10E6/uL (ref 4.14–5.80)
RDW: 12.5 % (ref 11.6–15.4)
WBC: 4.8 x10E3/uL (ref 3.4–10.8)

## 2023-12-02 LAB — LIPID PANEL
Chol/HDL Ratio: 3 ratio (ref 0.0–5.0)
Cholesterol, Total: 162 mg/dL (ref 100–199)
HDL: 54 mg/dL (ref >39)
LDL Chol Calc (NIH): 94 mg/dL (ref 0–99)
Triglycerides: 71 mg/dL (ref 0–149)
VLDL Cholesterol Cal: 14 mg/dL (ref 5–40)

## 2023-12-02 LAB — TSH: TSH: 0.819 u[IU]/mL (ref 0.450–4.500)

## 2023-12-02 LAB — HEMOGLOBIN A1C
Est. average glucose Bld gHb Est-mCnc: 126 mg/dL
Hgb A1c MFr Bld: 6 % — ABNORMAL HIGH (ref 4.8–5.6)

## 2023-12-02 LAB — PSA: Prostate Specific Ag, Serum: 1.3 ng/mL (ref 0.0–4.0)

## 2023-12-20 ENCOUNTER — Other Ambulatory Visit (HOSPITAL_BASED_OUTPATIENT_CLINIC_OR_DEPARTMENT_OTHER): Payer: Self-pay

## 2024-01-20 NOTE — Progress Notes (Signed)
 [name] [address] [place] [address] Phone: [phone] Subjective:   Stephen [name], am serving as a scribe for Dr. Arthea Claudene.  I'm seeing this patient by the request  of:  Stephen Folks L, PA  CC: Back and neck pain follow-up  YEP:Dlagzrupcz  Stephen [name]. is a 63 y.o. male coming in with complaint of back and neck pain. OMT 11/22/2023. Patient states that he has been doing great.  Nothing severe at this moment been able to take care of grandchildren recently.  Medications patient has been prescribed: Effexor   Taking:         Reviewed prior external information including notes and imaging from previsou exam, outside providers and external EMR if available.   As well as notes that were available from care everywhere and other healthcare systems.  Past medical history, social, surgical and family history all reviewed in electronic medical record.  No pertanent information unless stated regarding to the chief complaint.   Past Medical History:  Diagnosis Date   Allergy    Hayfever   Anxiety    Colon polyps    Detached retina, right 03/22/1990   Dr. Marjorie at [facility] did orbital band procedure with good results.    High cholesterol    Hypertension    Poison ivy dermatitis 07/29/2015    Allergies  Allergen Reactions   Turmeric Rash     Review of Systems:  No headache, visual changes, nausea, vomiting, diarrhea, constipation, dizziness, abdominal pain, skin rash, fevers, chills, night sweats, weight loss, swollen lymph nodes, body aches, joint swelling, chest pain, shortness of breath, mood changes. POSITIVE muscle aches  Objective  Blood pressure 126/86, pulse 90, height 6' 3 (1.905 m), weight 209 lb (94.8 kg), SpO2 98%.   General: No apparent distress alert and oriented x3 mood and affect normal, dressed appropriately.  HEENT: Pupils equal, extraocular movements intact  Respiratory: Patient's speak in full  sentences and does not appear short of breath  Cardiovascular: No lower extremity edema, non tender, no erythema  Gait MSK:  Back does have some loss lordosis moderately.  Neck exam seems to be the most decrease in range of motion.  More difficulty with extension of the neck.  Negative Spurling's.  5 out of 5 strength of the upper extremities  Osteopathic findings  C2 flexed rotated and side bent right C7 flexed rotated and side bent right T7 extended rotated and side bent right inhaled rib T9 extended rotated and side bent left L1 flexed rotated and side bent right Sacrum right on right     Assessment and Plan:  Cervical radiculopathy at C8 We discussed home exercises and icing regimen, discussed which activities to do and which ones to avoid.  Increasing activity slowly.  Discussed continuing to work on scapular strengthening.  No change in medications, continue the Effexor .  Follow-up again 2 to 3 months.    Nonallopathic problems  Decision today to treat with OMT was based on Physical Exam  After verbal consent patient was treated with , ME, FPR techniques in cervical, rib, thoracic, lumbar, and sacral  areas  Patient tolerated the procedure well with improvement in symptoms  Patient given exercises, stretches and lifestyle modifications  See medications in patient instructions if given  Patient will follow up in 4-8 weeks     The above documentation has been reviewed and is accurate and complete Stephen [name] M Stephen Cordell, DO  Note: This dictation was prepared with Dragon dictation along with smaller phrase technology. Any transcriptional errors that result from this process are unintentional.

## 2024-01-24 ENCOUNTER — Ambulatory Visit (INDEPENDENT_AMBULATORY_CARE_PROVIDER_SITE_OTHER): Admitting: Family Medicine

## 2024-01-24 ENCOUNTER — Encounter: Payer: Self-pay | Admitting: Family Medicine

## 2024-01-24 VITALS — BP 126/86 | HR 90 | Ht 75.0 in | Wt 209.0 lb

## 2024-01-24 DIAGNOSIS — M9902 Segmental and somatic dysfunction of thoracic region: Secondary | ICD-10-CM

## 2024-01-24 DIAGNOSIS — M9904 Segmental and somatic dysfunction of sacral region: Secondary | ICD-10-CM

## 2024-01-24 DIAGNOSIS — M9903 Segmental and somatic dysfunction of lumbar region: Secondary | ICD-10-CM

## 2024-01-24 DIAGNOSIS — M9908 Segmental and somatic dysfunction of rib cage: Secondary | ICD-10-CM | POA: Diagnosis not present

## 2024-01-24 DIAGNOSIS — M9901 Segmental and somatic dysfunction of cervical region: Secondary | ICD-10-CM

## 2024-01-24 DIAGNOSIS — M5412 Radiculopathy, cervical region: Secondary | ICD-10-CM | POA: Diagnosis not present

## 2024-01-24 NOTE — Assessment & Plan Note (Signed)
 We discussed home exercises and icing regimen, discussed which activities to do and which ones to avoid.  Increasing activity slowly.  Discussed continuing to work on scapular strengthening.  No change in medications, continue the Effexor .  Follow-up again 2 to 3 months.

## 2024-01-24 NOTE — Patient Instructions (Signed)
 Great to see you See me in 2 months

## 2024-03-21 NOTE — Progress Notes (Signed)
 " [name] [address] [place] [address] Phone: [phone] Subjective:   Stephen [name], am serving as a scribe for Dr. Arthea Claudene.  I'm seeing this patient by the request  of:  Lowella Folks L, PA  CC: Back and neck pain follow-up  YEP:Dlagzrupcz  Stephen [name]. is a 63 y.o. male coming in with complaint of back and neck pain. OMT 01/24/2024. Patient states that he has been doing well.  Has not had any flares recently.  Has been moving to a new house and thinks when he is done with this should be better.  Medications patient has been prescribed: Effexor   Taking:         Reviewed prior external information including notes and imaging from previsou exam, outside providers and external EMR if available.   As well as notes that were available from care everywhere and other healthcare systems.  Past medical history, social, surgical and family history all reviewed in electronic medical record.  No pertanent information unless stated regarding to the chief complaint.   Past Medical History:  Diagnosis Date   Allergy    Hayfever   Anxiety    Colon polyps    Detached retina, right 03/22/1990   Dr. Marjorie at [facility] did orbital band procedure with good results.    High cholesterol    Hypertension    Poison ivy dermatitis 07/29/2015    Allergies[1]   Review of Systems:  No headache, visual changes, nausea, vomiting, diarrhea, constipation, dizziness, abdominal pain, skin rash, fevers, chills, night sweats, weight loss, swollen lymph nodes, body aches, joint swelling, chest pain, shortness of breath, mood changes. POSITIVE muscle aches  Objective  Blood pressure 128/78, pulse (!) 101, height 6' 3 (1.905 m), weight 204 lb (92.5 kg), SpO2 97%.   General: No apparent distress alert and oriented x3 mood and affect normal, dressed appropriately.  HEENT: Pupils equal, extraocular movements intact  Respiratory: Patient's speak in full  sentences and does not appear short of breath  Cardiovascular: No lower extremity edema, non tender, no erythema  Gait relatively normal MSK:  Back does have some loss of lordosis.  Neck exam does have some limited sidebending bilaterally.  Neck exam lacks the last 5 degrees of extension.  Osteopathic findings  C2 flexed rotated and side bent right C6 flexed rotated and side bent left T3 extended rotated and side bent right inhaled rib T9 extended rotated and side bent left L2 flexed rotated and side bent right Sacrum right on right    Assessment and Plan:  Cervical radiculopathy at C8 Patient has done markedly well overall.  We discussed icing regimen and home exercises with discussed which activities to do and which ones to avoid.  Increase activity slowly. F/u 6-8 weeks     Nonallopathic problems  Decision today to treat with OMT was based on Physical Exam  After verbal consent patient was treated with HVLA, ME, FPR techniques in cervical, rib, thoracic, lumbar, and sacral  areas  Patient tolerated the procedure well with improvement in symptoms  Patient given exercises, stretches and lifestyle modifications  See medications in patient instructions if given  Patient will follow up in 4-8 weeks    The above documentation has been reviewed and is accurate and complete Stephen [name] M Stephen Mapel, DO          Note: This dictation was prepared with Dragon dictation along with smaller phrase technology. Any transcriptional errors that result from this  process are unintentional.            [1]  Allergies Allergen Reactions   Turmeric Rash   "

## 2024-03-27 ENCOUNTER — Encounter: Payer: Self-pay | Admitting: Family Medicine

## 2024-03-27 ENCOUNTER — Ambulatory Visit (INDEPENDENT_AMBULATORY_CARE_PROVIDER_SITE_OTHER): Admitting: Family Medicine

## 2024-03-27 VITALS — BP 128/78 | HR 101 | Ht 75.0 in | Wt 204.0 lb

## 2024-03-27 DIAGNOSIS — M9902 Segmental and somatic dysfunction of thoracic region: Secondary | ICD-10-CM

## 2024-03-27 DIAGNOSIS — M9904 Segmental and somatic dysfunction of sacral region: Secondary | ICD-10-CM

## 2024-03-27 DIAGNOSIS — M9901 Segmental and somatic dysfunction of cervical region: Secondary | ICD-10-CM | POA: Diagnosis not present

## 2024-03-27 DIAGNOSIS — M9908 Segmental and somatic dysfunction of rib cage: Secondary | ICD-10-CM | POA: Diagnosis not present

## 2024-03-27 DIAGNOSIS — M5412 Radiculopathy, cervical region: Secondary | ICD-10-CM

## 2024-03-27 DIAGNOSIS — M9903 Segmental and somatic dysfunction of lumbar region: Secondary | ICD-10-CM | POA: Diagnosis not present

## 2024-03-27 NOTE — Patient Instructions (Signed)
 Good to see you! Tell Stephen [name] she can do some of the cleaning See you again in 8-10 weeks

## 2024-03-27 NOTE — Assessment & Plan Note (Signed)
 Patient has done markedly well overall.  We discussed icing regimen and home exercises with discussed which activities to do and which ones to avoid.  Increase activity slowly. F/u 6-8 weeks

## 2024-05-23 ENCOUNTER — Ambulatory Visit: Admitting: Family Medicine

## 2024-08-02 ENCOUNTER — Ambulatory Visit

## 2024-12-03 ENCOUNTER — Encounter: Admitting: Urgent Care
